# Patient Record
Sex: Male | Born: 1960 | Race: Black or African American | Hispanic: No | Marital: Single | Smoking: Never smoker
Health system: Southern US, Community
[De-identification: ages and names within clinical notes are randomized; demographics above are authoritative.]

## PROBLEM LIST (undated history)

## (undated) DIAGNOSIS — I10 Essential (primary) hypertension: Secondary | ICD-10-CM

## (undated) DIAGNOSIS — C159 Malignant neoplasm of esophagus, unspecified: Secondary | ICD-10-CM

## (undated) DIAGNOSIS — N529 Male erectile dysfunction, unspecified: Secondary | ICD-10-CM

## (undated) DIAGNOSIS — D649 Anemia, unspecified: Secondary | ICD-10-CM

## (undated) DIAGNOSIS — E119 Type 2 diabetes mellitus without complications: Secondary | ICD-10-CM

## (undated) DIAGNOSIS — B019 Varicella without complication: Secondary | ICD-10-CM

## (undated) DIAGNOSIS — T7840XA Allergy, unspecified, initial encounter: Secondary | ICD-10-CM

## (undated) DIAGNOSIS — Z8711 Personal history of peptic ulcer disease: Secondary | ICD-10-CM

## (undated) DIAGNOSIS — J42 Unspecified chronic bronchitis: Secondary | ICD-10-CM

## (undated) DIAGNOSIS — Z8719 Personal history of other diseases of the digestive system: Secondary | ICD-10-CM

## (undated) DIAGNOSIS — I81 Portal vein thrombosis: Secondary | ICD-10-CM

## (undated) DIAGNOSIS — J45909 Unspecified asthma, uncomplicated: Secondary | ICD-10-CM

## (undated) DIAGNOSIS — F1010 Alcohol abuse, uncomplicated: Secondary | ICD-10-CM

## (undated) HISTORY — DX: Varicella without complication: B01.9

## (undated) HISTORY — DX: Type 2 diabetes mellitus without complications: E11.9

## (undated) HISTORY — DX: Essential (primary) hypertension: I10

## (undated) HISTORY — DX: Personal history of other diseases of the digestive system: Z87.19

## (undated) HISTORY — DX: Unspecified asthma, uncomplicated: J45.909

## (undated) HISTORY — DX: Anemia, unspecified: D64.9

## (undated) HISTORY — DX: Personal history of peptic ulcer disease: Z87.11

## (undated) HISTORY — DX: Portal vein thrombosis: I81

## (undated) HISTORY — DX: Malignant neoplasm of esophagus, unspecified: C15.9

## (undated) HISTORY — DX: Allergy, unspecified, initial encounter: T78.40XA

## (undated) HISTORY — PX: HERNIA REPAIR: SHX51

---
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
## 2006-07-24 ENCOUNTER — Emergency Department: Payer: Self-pay | Admitting: Emergency Medicine

## 2007-08-22 ENCOUNTER — Emergency Department: Payer: Self-pay | Admitting: Emergency Medicine

## 2007-11-30 ENCOUNTER — Emergency Department: Payer: Self-pay | Admitting: Internal Medicine

## 2007-12-24 ENCOUNTER — Ambulatory Visit: Payer: Self-pay | Admitting: General Surgery

## 2011-03-17 ENCOUNTER — Emergency Department: Payer: Self-pay | Admitting: Unknown Physician Specialty

## 2011-11-29 ENCOUNTER — Emergency Department: Payer: Self-pay

## 2011-11-30 ENCOUNTER — Emergency Department: Payer: Self-pay | Admitting: Emergency Medicine

## 2014-01-14 ENCOUNTER — Emergency Department: Payer: Self-pay | Admitting: Emergency Medicine

## 2014-01-14 LAB — COMPREHENSIVE METABOLIC PANEL
ALK PHOS: 78 U/L
AST: 22 U/L (ref 15–37)
Albumin: 4.7 g/dL (ref 3.4–5.0)
Anion Gap: 7 (ref 7–16)
BILIRUBIN TOTAL: 1.6 mg/dL — AB (ref 0.2–1.0)
BUN: 14 mg/dL (ref 7–18)
CALCIUM: 10 mg/dL (ref 8.5–10.1)
CO2: 32 mmol/L (ref 21–32)
CREATININE: 0.83 mg/dL (ref 0.60–1.30)
Chloride: 93 mmol/L — ABNORMAL LOW (ref 98–107)
EGFR (Non-African Amer.): >60
GLUCOSE: 128 mg/dL — AB (ref 65–99)
Osmolality: 267 (ref 275–301)
Potassium: 3.8 mmol/L (ref 3.5–5.1)
SGPT (ALT): 32 U/L (ref 12–78)
Sodium: 132 mmol/L — ABNORMAL LOW (ref 136–145)
Total Protein: 9.4 g/dL — ABNORMAL HIGH (ref 6.4–8.2)

## 2014-01-14 LAB — URINALYSIS, COMPLETE
Bacteria: NONE SEEN
Bilirubin,UR: NEGATIVE
Glucose,UR: NEGATIVE mg/dL (ref 0–75)
Hyaline Cast: 1
Leukocyte Esterase: NEGATIVE
Nitrite: NEGATIVE
Ph: 5 (ref 4.5–8.0)
SPECIFIC GRAVITY: 1.027 (ref 1.003–1.030)
Squamous Epithelial: NONE SEEN
WBC UR: 2 /HPF (ref 0–5)

## 2014-01-14 LAB — CBC WITH DIFFERENTIAL/PLATELET
Basophil #: 0 10*3/uL (ref 0.0–0.1)
Basophil %: 0.5 %
Eosinophil #: 0 10*3/uL (ref 0.0–0.7)
Eosinophil %: 0.4 %
HCT: 48.1 % (ref 40.0–52.0)
HGB: 16.1 g/dL (ref 13.0–18.0)
Lymphocyte #: 1 10*3/uL (ref 1.0–3.6)
Lymphocyte %: 14.3 %
MCH: 31.3 pg (ref 26.0–34.0)
MCHC: 33.4 g/dL (ref 32.0–36.0)
MCV: 94 fL (ref 80–100)
MONOS PCT: 14.2 %
Monocyte #: 1 x10 3/mm (ref 0.2–1.0)
Neutrophil #: 5.1 10*3/uL (ref 1.4–6.5)
Neutrophil %: 70.6 %
PLATELETS: 239 10*3/uL (ref 150–440)
RBC: 5.13 10*6/uL (ref 4.40–5.90)
RDW: 13.3 % (ref 11.5–14.5)
WBC: 7.2 10*3/uL (ref 3.8–10.6)

## 2014-01-14 LAB — TROPONIN I: Troponin-I: <0.02 ng/mL

## 2014-01-14 LAB — LIPASE, BLOOD: Lipase: 45 U/L — ABNORMAL LOW (ref 73–393)

## 2014-12-03 ENCOUNTER — Emergency Department: Payer: Self-pay | Admitting: Internal Medicine

## 2014-12-03 LAB — COMPREHENSIVE METABOLIC PANEL
ALBUMIN: 4.3 g/dL (ref 3.4–5.0)
ALK PHOS: 71 U/L
ANION GAP: 10 (ref 7–16)
AST: 51 U/L — AB (ref 15–37)
BUN: 8 mg/dL (ref 7–18)
Bilirubin,Total: 0.9 mg/dL (ref 0.2–1.0)
Calcium, Total: 9.1 mg/dL (ref 8.5–10.1)
Chloride: 97 mmol/L — ABNORMAL LOW (ref 98–107)
Co2: 30 mmol/L (ref 21–32)
Creatinine: 0.69 mg/dL (ref 0.60–1.30)
EGFR (African American): >60
EGFR (Non-African Amer.): >60
Glucose: 94 mg/dL (ref 65–99)
OSMOLALITY: 272 (ref 275–301)
Potassium: 3.8 mmol/L (ref 3.5–5.1)
SGPT (ALT): 42 U/L
Sodium: 137 mmol/L (ref 136–145)
Total Protein: 8.7 g/dL — ABNORMAL HIGH (ref 6.4–8.2)

## 2014-12-03 LAB — CBC
HCT: 45.7 % (ref 40.0–52.0)
HGB: 15.1 g/dL (ref 13.0–18.0)
MCH: 31.6 pg (ref 26.0–34.0)
MCHC: 33.1 g/dL (ref 32.0–36.0)
MCV: 96 fL (ref 80–100)
Platelet: 262 10*3/uL (ref 150–440)
RBC: 4.78 10*6/uL (ref 4.40–5.90)
RDW: 13.3 % (ref 11.5–14.5)
WBC: 6.5 10*3/uL (ref 3.8–10.6)

## 2014-12-03 LAB — TROPONIN I: Troponin-I: <0.02 ng/mL

## 2014-12-03 LAB — URINALYSIS, COMPLETE
BACTERIA: NONE SEEN
BILIRUBIN, UR: NEGATIVE
Blood: NEGATIVE
Glucose,UR: NEGATIVE mg/dL (ref 0–75)
Hyaline Cast: 2
KETONE: NEGATIVE
Leukocyte Esterase: NEGATIVE
Nitrite: NEGATIVE
PH: 5 (ref 4.5–8.0)
Protein: NEGATIVE
RBC,UR: <1 /HPF (ref 0–5)
Specific Gravity: 1.005 (ref 1.003–1.030)
Squamous Epithelial: NONE SEEN

## 2014-12-03 LAB — LIPASE, BLOOD: LIPASE: 45 U/L — AB (ref 73–393)

## 2014-12-03 LAB — PRO B NATRIURETIC PEPTIDE: B-Type Natriuretic Peptide: 47 pg/mL (ref 0–125)

## 2015-08-02 ENCOUNTER — Other Ambulatory Visit: Payer: Self-pay | Admitting: Internal Medicine

## 2015-08-02 ENCOUNTER — Ambulatory Visit (INDEPENDENT_AMBULATORY_CARE_PROVIDER_SITE_OTHER): Payer: PRIVATE HEALTH INSURANCE | Admitting: Internal Medicine

## 2015-08-02 ENCOUNTER — Encounter: Payer: Self-pay | Admitting: Internal Medicine

## 2015-08-02 VITALS — BP 134/72 | HR 82 | Temp 98.2°F | Ht 72.5 in | Wt 166.0 lb

## 2015-08-02 DIAGNOSIS — F1010 Alcohol abuse, uncomplicated: Secondary | ICD-10-CM

## 2015-08-02 DIAGNOSIS — N529 Male erectile dysfunction, unspecified: Secondary | ICD-10-CM

## 2015-08-02 DIAGNOSIS — I10 Essential (primary) hypertension: Secondary | ICD-10-CM | POA: Diagnosis not present

## 2015-08-02 LAB — COMPREHENSIVE METABOLIC PANEL
ALT: 43 U/L (ref 0–53)
AST: 39 U/L — AB (ref 0–37)
Albumin: 4.8 g/dL (ref 3.5–5.2)
Alkaline Phosphatase: 68 U/L (ref 39–117)
BILIRUBIN TOTAL: 1 mg/dL (ref 0.2–1.2)
BUN: 12 mg/dL (ref 6–23)
CALCIUM: 9.8 mg/dL (ref 8.4–10.5)
CHLORIDE: 99 meq/L (ref 96–112)
CO2: 29 meq/L (ref 19–32)
Creatinine, Ser: 0.81 mg/dL (ref 0.40–1.50)
GFR: 126.78 mL/min (ref >60.00)
GLUCOSE: 99 mg/dL (ref 70–99)
POTASSIUM: 4.2 meq/L (ref 3.5–5.1)
Sodium: 138 mEq/L (ref 135–145)
Total Protein: 8.3 g/dL (ref 6.0–8.3)

## 2015-08-02 LAB — CBC
HEMATOCRIT: 44.9 % (ref 39.0–52.0)
Hemoglobin: 15 g/dL (ref 13.0–17.0)
MCHC: 33.5 g/dL (ref 30.0–36.0)
MCV: 94.1 fl (ref 78.0–100.0)
PLATELETS: 264 10*3/uL (ref 150.0–400.0)
RBC: 4.77 Mil/uL (ref 4.22–5.81)
RDW: 14.1 % (ref 11.5–15.5)
WBC: 8.3 10*3/uL (ref 4.0–10.5)

## 2015-08-02 MED ORDER — SILDENAFIL CITRATE 25 MG PO TABS: 25.0000 mg | ORAL_TABLET | Freq: Every day | ORAL | Status: DC | PRN

## 2015-08-02 MED ORDER — DISULFIRAM 500 MG PO TABS: 1.0000 | ORAL_TABLET | Freq: Every day | ORAL | Status: DC

## 2015-08-02 MED ORDER — DISULFIRAM 250 MG PO TABS: 250.0000 mg | ORAL_TABLET | Freq: Every day | ORAL | Status: DC

## 2015-08-02 NOTE — Assessment & Plan Note (Signed)
Pt is aware that there is a problem Discussed long term effects from alcohol abuse Will check CMET today He is interested in seeking out AA meetings, his wife agrees that she will go with him If labs normal, consider antabuse daily as a deterrent

## 2015-08-02 NOTE — Assessment & Plan Note (Signed)
He is interested in treatment for this eRx for Viagra 25 mg as directed Discount Coupon given

## 2015-08-02 NOTE — Progress Notes (Signed)
Pre visit review using our clinic review tool, if applicable. No additional management support is needed unless otherwise documented below in the visit note. 

## 2015-08-02 NOTE — Progress Notes (Signed)
HPI  Pt presents to the clinic today to establish care and for management of the conditions listed below. He has not had a PCP in many years.  Flu: never Tetanus: > 10 years ago PSA Screen: never Colon Screen: never Vision Screening: as needed Dentist: as needed  HTN: He reports he went to [facility]  12/2014. His blood pressure was elevated at that time so he was started on HCTZ. He reports he does not take it because he thinks the medication is interfering with his ability to obtain an erection. He denies headaches, blurred vision, chest pain or shortness of breath.  Erectile Dysfunction: He has difficulty obtaining and maintaining an erection. He is able to achieve orgasm when he gets to that point. He has never had a issue with this in the past which is why he thinks it may be related to the BP medication. However he reports it has not improved since he stopped taking the medication. He is sexually active in a monogamous relationship.  His wife is also with him today. She is concerned about his drinking. She reports he drinks 3- 40 ounce beers in the morning before work and 3- 40 ounce beers every evening. C: He does feel like he needs to cut back A: He does feel like people annoy him about how much he drinks G: He does feel guilty about how much he drinks E: He does feel like he needs to drink in the morning to wake up and function   Past Medical History  Diagnosis Date  . Hypertension   . History of stomach ulcers   . Chicken pox     Current Outpatient Prescriptions  Medication Sig Dispense Refill  . hydrochlorothiazide (HYDRODIURIL) 25 MG tablet Take 25 mg by mouth daily.     No current facility-administered medications for this visit.    No Known Allergies  Family History  Problem Relation Age of Onset  . Hypertension Mother     History   Social History  . Marital Status: Single    Spouse Name: N/A  . Number of Children: N/A  . Years of Education: N/A    Occupational History  . Not on file.   Social History Main Topics  . Smoking status: Never Smoker   . Smokeless tobacco: Current User    Types: Chew     Comment: 5 years  . Alcohol Use: 3.6 oz/week    6 Cans of beer per week     Comment: daily  . Drug Use: No  . Sexual Activity: Not on file   Other Topics Concern  . Not on file   Social History Narrative  . No narrative on file    ROS:  Constitutional: Denies fever, malaise, fatigue, headache or abrupt weight changes.  HEENT: Denies eye pain, eye redness, ear pain, ringing in the ears, wax buildup, runny nose, nasal congestion, bloody nose, or sore throat. Respiratory: Denies difficulty breathing, shortness of breath, cough or sputum production.   Cardiovascular: Denies chest pain, chest tightness, palpitations or swelling in the hands or feet.  Gastrointestinal: Denies abdominal pain, bloating, constipation, diarrhea or blood in the stool.  GU: Pt reports erectile dysfunction. Denies frequency, urgency, pain with urination, blood in urine, odor or discharge. Musculoskeletal: Denies decrease in range of motion, difficulty with gait, muscle pain or joint pain and swelling.  Skin: Denies redness, rashes, lesions or ulcercations.  Neurological: Denies dizziness, difficulty with memory, difficulty with speech or problems with balance and coordination.  Psych: Denies anxiety, depression, SI/HI.  No other specific complaints in a complete review of systems (except as listed in HPI above).  PE:  BP 134/72 mmHg  Pulse 82  Temp(Src) 98.2 F (36.8 C) (Oral)  Ht 6' 0.5" (1.842 m)  Wt 166 lb (75.297 kg)  BMI 22.19 kg/m2  SpO2 98% Wt Readings from Last 3 Encounters:  08/02/15 166 lb (75.297 kg)    General: Appears his stated age, well developed, well nourished in NAD. HEENT: Head: normal shape and size; Eyes: slight scleral icterus bilaterally, conjunctiva pink, PERRLA and EOMs intact;  Cardiovascular: Normal rate and  rhythm. S1,S2 noted.  No murmur, rubs or gallops noted. Pulmonary/Chest: Normal effort and positive vesicular breath sounds. No respiratory distress. No wheezes, rales or ronchi noted.  Abdomen: Distended but soft and nontender. Normal bowel sounds, no bruits noted. No distention or masses noted.  Neurological: Alert and oriented.  Psychiatric: Mood and affect normal. Behavior is normal. Judgment and thought content normal.     BMET    Component Value Date/Time   NA 137 12/03/2014 1205   K 3.8 12/03/2014 1205   CL 97* 12/03/2014 1205   CO2 30 12/03/2014 1205   GLUCOSE 94 12/03/2014 1205   BUN 8 12/03/2014 1205   CREATININE 0.69 12/03/2014 1205   CALCIUM 9.1 12/03/2014 1205   GFRNONAA >60 01/14/2014 2000   GFRAA >60 01/14/2014 2000    Lipid Panel  No results found for: CHOL, TRIG, HDL, CHOLHDL, VLDL, LDLCALC  CBC    Component Value Date/Time   WBC 6.5 12/03/2014 1205   RBC 4.78 12/03/2014 1205   HGB 15.1 12/03/2014 1205   HCT 45.7 12/03/2014 1205   PLT 262 12/03/2014 1205   MCV 96 12/03/2014 1205   MCH 31.6 12/03/2014 1205   MCHC 33.1 12/03/2014 1205   RDW 13.3 12/03/2014 1205   LYMPHSABS 1.0 01/14/2014 2000   MONOABS 1.0 01/14/2014 2000   EOSABS 0.0 01/14/2014 2000   BASOSABS 0.0 01/14/2014 2000    Hgb A1C No results found for: HGBA1C   Assessment and Plan:

## 2015-08-02 NOTE — Patient Instructions (Signed)
Alcohol Use Disorder Alcohol use disorder is a mental disorder. It is not a one-time incident of heavy drinking. Alcohol use disorder is the excessive and uncontrollable use of alcohol over time that leads to problems with functioning in one or more areas of daily living. People with this disorder risk harming themselves and others when they drink to excess. Alcohol use disorder also can cause other mental disorders, such as mood and anxiety disorders, and serious physical problems. People with alcohol use disorder often misuse other drugs.  Alcohol use disorder is common and widespread. Some people with this disorder drink alcohol to cope with or escape from negative life events. Others drink to relieve chronic pain or symptoms of mental illness. People with a family history of alcohol use disorder are at higher risk of losing control and using alcohol to excess.  SYMPTOMS  Signs and symptoms of alcohol use disorder may include the following:   Consumption ofalcohol inlarger amounts or over a longer period of time than intended.  Multiple unsuccessful attempts to cutdown or control alcohol use.   A great deal of time spent obtaining alcohol, using alcohol, or recovering from the effects of alcohol (hangover).  A strong desire or urge to use alcohol (cravings).   Continued use of alcohol despite problems at work, school, or home because of alcohol use.   Continued use of alcohol despite problems in relationships because of alcohol use.  Continued use of alcohol in situations when it is physically hazardous, such as driving a car.  Continued use of alcohol despite awareness of a physical or psychological problem that is likely related to alcohol use. Physical problems related to alcohol use can involve the brain, heart, liver, stomach, and intestines. Psychological problems related to alcohol use include intoxication, depression, anxiety, psychosis, delirium, and dementia.   The need for  increased amounts of alcohol to achieve the same desired effect, or a decreased effect from the consumption of the same amount of alcohol (tolerance).  Withdrawal symptoms upon reducing or stopping alcohol use, or alcohol use to reduce or avoid withdrawal symptoms. Withdrawal symptoms include:  Racing heart.  Hand tremor.  Difficulty sleeping.  Nausea.  Vomiting.  Hallucinations.  Restlessness.  Seizures. DIAGNOSIS Alcohol use disorder is diagnosed through an assessment by your health care provider. Your health care provider may start by asking three or four questions to screen for excessive or problematic alcohol use. To confirm a diagnosis of alcohol use disorder, at least two symptoms must be present within a 12-month period. The severity of alcohol use disorder depends on the number of symptoms:  Mild--two or three.  Moderate--four or five.  Severe--six or more. Your health care provider may perform a physical exam or use results from lab tests to see if you have physical problems resulting from alcohol use. Your health care provider may refer you to a mental health professional for evaluation. TREATMENT  Some people with alcohol use disorder are able to reduce their alcohol use to low-risk levels. Some people with alcohol use disorder need to quit drinking alcohol. When necessary, mental health professionals with specialized training in substance use treatment can help. Your health care provider can help you decide how severe your alcohol use disorder is and what type of treatment you need. The following forms of treatment are available:   Detoxification. Detoxification involves the use of prescription medicines to prevent alcohol withdrawal symptoms in the first week after quitting. This is important for people with a history of symptoms   of withdrawal and for heavy drinkers who are likely to have withdrawal symptoms. Alcohol withdrawal can be dangerous and, in severe cases, cause  death. Detoxification is usually provided in a hospital or in-patient substance use treatment facility.  Counseling or talk therapy. Talk therapy is provided by substance use treatment counselors. It addresses the reasons people use alcohol and ways to keep them from drinking again. The goals of talk therapy are to help people with alcohol use disorder find healthy activities and ways to cope with life stress, to identify and avoid triggers for alcohol use, and to handle cravings, which can cause relapse.  Medicines.Different medicines can help treat alcohol use disorder through the following actions:  Decrease alcohol cravings.  Decrease the positive reward response felt from alcohol use.  Produce an uncomfortable physical reaction when alcohol is used (aversion therapy).  Support groups. Support groups are run by people who have quit drinking. They provide emotional support, advice, and guidance. These forms of treatment are often combined. Some people with alcohol use disorder benefit from intensive combination treatment provided by specialized substance use treatment centers. Both inpatient and outpatient treatment programs are available. Document Released: 01/18/2005 Document Revised: 04/27/2014 Document Reviewed: 03/20/2013 ExitCare Patient Information 2015 [employer]. This information is not intended to replace advice given to you by your health care provider. Make sure you discuss any questions you have with your health care provider.  

## 2015-08-02 NOTE — Assessment & Plan Note (Signed)
I think his BP is well controlled off meds Will check CBC and CMET today He will stop HCTZ at this time

## 2015-08-05 ENCOUNTER — Other Ambulatory Visit: Payer: Self-pay | Admitting: Internal Medicine

## 2015-08-05 ENCOUNTER — Telehealth: Payer: Self-pay

## 2015-08-05 MED ORDER — SILDENAFIL CITRATE 20 MG PO TABS: ORAL_TABLET | ORAL | Status: DC

## 2015-08-05 NOTE — Telephone Encounter (Signed)
Pt called and viagra with discount coupon was going to be $250.00 at [facility]. Pt request generic called to [facility] for $80.00 if [name] NP will consider. Pt request cb. Copy of flyer from [facility] in [place] NP in box.

## 2015-08-05 NOTE — Telephone Encounter (Signed)
done

## 2015-08-10 NOTE — Telephone Encounter (Signed)
Pt called back because did not get cb on 08/05/15. Apologized to pt about not getting return cann and advised pt to ck with [facility]. Pt voiced understanding.

## 2015-09-21 ENCOUNTER — Ambulatory Visit: Payer: Self-pay | Admitting: Internal Medicine

## 2016-02-09 ENCOUNTER — Encounter (HOSPITAL_COMMUNITY): Payer: Self-pay | Admitting: Emergency Medicine

## 2016-02-09 ENCOUNTER — Emergency Department (HOSPITAL_COMMUNITY): Payer: Self-pay

## 2016-02-09 ENCOUNTER — Emergency Department (HOSPITAL_COMMUNITY)
Admission: EM | Admit: 2016-02-09 | Discharge: 2016-02-09 | Disposition: A | Discharge status: Home / Self Care | Payer: Self-pay | Attending: Emergency Medicine | Admitting: Emergency Medicine

## 2016-02-09 DIAGNOSIS — Z8619 Personal history of other infectious and parasitic diseases: Secondary | ICD-10-CM | POA: Insufficient documentation

## 2016-02-09 DIAGNOSIS — R52 Pain, unspecified: Secondary | ICD-10-CM | POA: Insufficient documentation

## 2016-02-09 DIAGNOSIS — R0981 Nasal congestion: Secondary | ICD-10-CM | POA: Insufficient documentation

## 2016-02-09 DIAGNOSIS — M791 Myalgia: Secondary | ICD-10-CM | POA: Insufficient documentation

## 2016-02-09 DIAGNOSIS — Z79899 Other long term (current) drug therapy: Secondary | ICD-10-CM | POA: Insufficient documentation

## 2016-02-09 DIAGNOSIS — I10 Essential (primary) hypertension: Secondary | ICD-10-CM | POA: Insufficient documentation

## 2016-02-09 DIAGNOSIS — Z8719 Personal history of other diseases of the digestive system: Secondary | ICD-10-CM | POA: Insufficient documentation

## 2016-02-09 DIAGNOSIS — J029 Acute pharyngitis, unspecified: Secondary | ICD-10-CM | POA: Insufficient documentation

## 2016-02-09 DIAGNOSIS — R6889 Other general symptoms and signs: Secondary | ICD-10-CM

## 2016-02-09 DIAGNOSIS — R197 Diarrhea, unspecified: Secondary | ICD-10-CM | POA: Insufficient documentation

## 2016-02-09 MED ORDER — DM-GUAIFENESIN ER 30-600 MG PO TB12: 1.0000 | ORAL_TABLET | Freq: Two times a day (BID) | ORAL | Status: DC

## 2016-02-09 MED ORDER — LOPERAMIDE HCL 2 MG PO TABS: 2.0000 mg | ORAL_TABLET | Freq: Four times a day (QID) | ORAL | Status: DC | PRN

## 2016-02-09 NOTE — Discharge Instructions (Signed)
Chest x-ray negative for pneumonia.  Symptoms seem to be consistent with flulike illness. Take Mucinex DM for the cough and congestion. Take the Imodium right ear as needed for the diarrhea. Return for any new or worse symptoms. Work note provided.

## 2016-02-09 NOTE — ED Notes (Addendum)
Pt c/o generalized body aches, non-productive cough, nasal congestion, and diarrhea x 4 days. Denies n/v. Denies fevers.

## 2016-02-09 NOTE — ED Notes (Signed)
Pt attempted to sign signature pad, but it didn't take signature.

## 2016-02-09 NOTE — ED Provider Notes (Signed)
CSN: [id number]     Arrival date & time 02/09/16  5284 History   First MD Initiated Contact with Patient 02/09/16 [phone]     Chief Complaint  Patient presents with  . Generalized Body Aches     (Consider location/radiation/quality/duration/timing/severity/associated sxs/prior Treatment) The history is provided by the patient.   55 year old male with a complaint of body aches cough congestion and diarrhea for 4 days. No nausea no vomiting. No fevers. Patient had the flu shot 2 weeks ago.  Past Medical History  Diagnosis Date  . Hypertension   . History of stomach ulcers   . Chicken pox    Past Surgical History  Procedure Laterality Date  . Appendectomy  1978   Family History  Problem Relation Age of Onset  . Hypertension Mother   . Diabetes Mother   . Cancer Neg Hx   . Heart disease Neg Hx   . Stroke Neg Hx    Social History  Substance Use Topics  . Smoking status: Never Smoker   . Smokeless tobacco: Current User    Types: Chew     Comment: 5 years  . Alcohol Use: Yes     Comment: occ    Review of Systems  Constitutional: Negative for fever.  HENT: Positive for congestion and sore throat.   Eyes: Negative for redness.  Respiratory: Positive for cough. Negative for shortness of breath.   Gastrointestinal: Positive for diarrhea. Negative for nausea, vomiting and abdominal pain.  Genitourinary: Negative for dysuria.  Musculoskeletal: Positive for myalgias.  Skin: Negative for rash.  Neurological: Negative for headaches.  Hematological: Does not bruise/bleed easily.  Psychiatric/Behavioral: Negative for confusion.      Allergies  Review of patient's allergies indicates no known allergies.  Home Medications   Prior to Admission medications   Medication Sig Start Date End Date Taking? Authorizing Provider  dextromethorphan-guaiFENesin (MUCINEX DM) 30-600 MG 12hr tablet Take 1 tablet by mouth 2 (two) times daily. 02/09/16   Fredia Sorrow, MD  disulfiram  (ANTABUSE) 250 MG tablet Take 1 tablet (250 mg total) by mouth daily. 08/02/15   Jearld Fenton, NP  Disulfiram 500 MG TABS Take 1 tablet (500 mg total) by mouth daily. 08/02/15   Jearld Fenton, NP  loperamide (IMODIUM A-D) 2 MG tablet Take 1 tablet (2 mg total) by mouth 4 (four) times daily as needed for diarrhea or loose stools. 02/09/16   Fredia Sorrow, MD  sildenafil (REVATIO) 20 MG tablet Take 2-5 tablets as needed for sexual activity 08/05/15   Jearld Fenton, NP   BP 127/76 mmHg  Pulse 74  Temp(Src) 97.9 F (36.6 C) (Oral)  Resp 20  Ht '6\' 2"'$  (1.88 m)  Wt 74.844 kg  BMI 21.18 kg/m2  SpO2 100% Physical Exam  Constitutional: He is oriented to person, place, and time. He appears well-developed and well-nourished. No distress.  HENT:  Head: Normocephalic and atraumatic.  Mouth/Throat: Oropharynx is clear and moist.  Eyes: Conjunctivae and EOM are normal. Pupils are equal, round, and reactive to light.  Neck: Normal range of motion. Neck supple.  Cardiovascular: Normal rate, regular rhythm and normal heart sounds.   No murmur heard. Pulmonary/Chest: Effort normal and breath sounds normal. No respiratory distress. He has no wheezes. He has no rales.  Abdominal: Soft. Bowel sounds are normal. There is no tenderness.  Musculoskeletal: Normal range of motion.  Neurological: He is alert and oriented to person, place, and time. No cranial nerve deficit. He exhibits normal muscle  tone. Coordination normal.  Skin: Skin is warm. No rash noted.  Nursing note and vitals reviewed.   ED Course  Procedures (including critical care time) Labs Review Labs Reviewed - No data to display  Imaging Review Dg Chest 2 View  02/09/2016  CLINICAL DATA:  Cough and congestion ; fever for 5 days EXAM: CHEST  2 VIEW COMPARISON:  None. FINDINGS: There is no appreciable edema or consolidation. Heart size and pulmonary vascularity are normal. No adenopathy. There are apparent syndesmophytic changes in the  thoracic spine. IMPRESSION: No edema or consolidation. 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 Electronically Signed   By: Lowella Grip III M.D.   On: 02/09/2016 08:04   I have personally reviewed and evaluated these images and lab results as part of my medical decision-making.   EKG Interpretation None      MDM   Final diagnoses:  Flu-like symptoms    4 day history of flulike illness. Cough congestion body aches and not diarrhea. No vomiting. Patient nontoxic no acute distress. Chest x-ray negative for pneumonia. Will treat symptomatically.    Fredia Sorrow, MD 02/09/16 [phone]

## 2016-03-25 ENCOUNTER — Emergency Department: Payer: 59

## 2016-03-25 ENCOUNTER — Emergency Department
Admission: EM | Admit: 2016-03-25 | Discharge: 2016-03-25 | Disposition: A | Discharge status: Home / Self Care | Payer: 59 | Attending: Student | Admitting: Student

## 2016-03-25 ENCOUNTER — Encounter: Payer: Self-pay | Admitting: Emergency Medicine

## 2016-03-25 DIAGNOSIS — J42 Unspecified chronic bronchitis: Secondary | ICD-10-CM | POA: Insufficient documentation

## 2016-03-25 DIAGNOSIS — I10 Essential (primary) hypertension: Secondary | ICD-10-CM | POA: Diagnosis not present

## 2016-03-25 DIAGNOSIS — Z720 Tobacco use: Secondary | ICD-10-CM | POA: Diagnosis not present

## 2016-03-25 DIAGNOSIS — Z79899 Other long term (current) drug therapy: Secondary | ICD-10-CM | POA: Diagnosis not present

## 2016-03-25 DIAGNOSIS — R05 Cough: Secondary | ICD-10-CM | POA: Diagnosis not present

## 2016-03-25 DIAGNOSIS — R0602 Shortness of breath: Secondary | ICD-10-CM | POA: Diagnosis not present

## 2016-03-25 MED ORDER — IPRATROPIUM-ALBUTEROL 0.5-2.5 (3) MG/3ML IN SOLN
3.0000 mL | Freq: Once | RESPIRATORY_TRACT | Status: AC
  Administered 2016-03-25: 3 mL via RESPIRATORY_TRACT
  Filled 2016-03-25: qty 3

## 2016-03-25 MED ORDER — ALBUTEROL SULFATE HFA 108 (90 BASE) MCG/ACT IN AERS: 2.0000 | INHALATION_SPRAY | Freq: Four times a day (QID) | RESPIRATORY_TRACT | Status: DC | PRN

## 2016-03-25 MED ORDER — BENZONATATE 100 MG PO CAPS: 200.0000 mg | ORAL_CAPSULE | Freq: Three times a day (TID) | ORAL | Status: DC | PRN

## 2016-03-25 NOTE — Discharge Instructions (Signed)
Been using inhaler as directed. Tessalon is for cough as needed. You will need to follow up with your doctor at [facility] or go to [facility] for continued evaluation of your chronic cough. More tests may be indicated if not improving. There was no evidence of pneumonia or acute changes on your chest x-ray today.

## 2016-03-25 NOTE — ED Notes (Signed)
Discussed discharge instructions, prescriptions, and follow-up care with patient. No questions or concerns at this time. Pt stable at discharge.  

## 2016-03-25 NOTE — ED Provider Notes (Signed)
[facility] Emergency Department Provider Note  ____________________________________________  Time seen: Approximately 1:34 PM  I have reviewed the triage vital signs and the nursing notes.   HISTORY  Chief Complaint Cough   HPI Joshua Greene is a 55 y.o. male is here with complaint of constant dry cough for over 1 month. Patient denies any respiratory distress and has not been taking any over-the-counter medication. He states he was seen at [facility]  at the beginning of a cough where he was told that he did not have the flu. Patient denies being a smoker but did grow up around people who did smoke.Marland Kitchen He states that during his childhood he did have asthma.He has not used an inhaler in quite some time. He is not aware of any wheezing but states that he feels like he is not able to take deep breaths.   Past Medical History  Diagnosis Date  . Hypertension   . History of stomach ulcers   . Chicken pox     Patient Active Problem List   Diagnosis Date Noted  . Alcohol abuse 08/02/2015  . Erectile dysfunction 08/02/2015  . Essential hypertension 08/02/2015    Past Surgical History  Procedure Laterality Date  . Appendectomy  1978    Current Outpatient Rx  Name  Route  Sig  Dispense  Refill  . albuterol (PROVENTIL HFA;VENTOLIN HFA) 108 (90 Base) MCG/ACT inhaler   Inhalation   Inhale 2 puffs into the lungs every 6 (six) hours as needed for wheezing or shortness of breath.   1 Inhaler   2   . benzonatate (TESSALON PERLES) 100 MG capsule   Oral   Take 2 capsules (200 mg total) by mouth 3 (three) times daily as needed for cough.   30 capsule   0   . dextromethorphan-guaiFENesin (MUCINEX DM) 30-600 MG 12hr tablet   Oral   Take 1 tablet by mouth 2 (two) times daily.   14 tablet   1   . disulfiram (ANTABUSE) 250 MG tablet   Oral   Take 1 tablet (250 mg total) by mouth daily.   30 tablet   0     Take this dose after 2 weeks of the 50 mg  dose   . Disulfiram 500 MG TABS   Oral   Take 1 tablet (500 mg total) by mouth daily.   14 each   0   . loperamide (IMODIUM A-D) 2 MG tablet   Oral   Take 1 tablet (2 mg total) by mouth 4 (four) times daily as needed for diarrhea or loose stools.   30 tablet   0   . sildenafil (REVATIO) 20 MG tablet      Take 2-5 tablets as needed for sexual activity   50 tablet   0     Allergies Review of patient's allergies indicates no known allergies.  Family History  Problem Relation Age of Onset  . Hypertension Mother   . Diabetes Mother   . Cancer Neg Hx   . Heart disease Neg Hx   . Stroke Neg Hx     Social History Social History  Substance Use Topics  . Smoking status: Never Smoker   . Smokeless tobacco: Current User    Types: Chew     Comment: 5 years  . Alcohol Use: Yes     Comment: occ    Review of Systems Constitutional: No known fever/no chills ENT: No sore throat. Cardiovascular: Denies chest pain. Respiratory:  Denies shortness of breath. Positive cough Gastrointestinal:   No nausea, no vomiting.   Skin: Negative for rash. Neurological: Negative for headaches, focal weakness or numbness.  10-point ROS otherwise negative.  ____________________________________________   PHYSICAL EXAM:  VITAL SIGNS: ED Triage Vitals  Enc Vitals Group     BP 03/25/16 1251 121/79 mmHg     Pulse Rate 03/25/16 1251 84     Resp 03/25/16 1251 18     Temp 03/25/16 1251 98.2 F (36.8 C)     Temp Source 03/25/16 1251 Oral     SpO2 03/25/16 1251 95 %     Weight 03/25/16 1251 165 lb (74.844 kg)     Height 03/25/16 1251 '6\' 2"'$  (1.88 m)     Head Cir --      Peak Flow --      Pain Score --      Pain Loc --      Pain Edu? --      Excl. in [place]? --     Constitutional: Alert and oriented. Well appearing and in no acute distress. Eyes: Conjunctivae are normal. PERRL. EOMI. Head: Atraumatic. Nose: No congestion/rhinnorhea.   EACs and TMs are clear bilaterally. Mouth/Throat:  Mucous membranes are moist.  Oropharynx non-erythematous. Neck: No stridor.  Supple Hematological/Lymphatic/Immunilogical: No cervical lymphadenopathy. Cardiovascular: Normal rate, regular rhythm. Grossly normal heart sounds.  Good peripheral circulation. Respiratory: Normal respiratory effort. Poor air exchange at times which is questionable for patient effort. No retractions. Lungs CTAB. Musculoskeletal: Moves upper and lower stories without any difficulty. Normal gait was noted. Neurologic:  Normal speech and language. No gross focal neurologic deficits are appreciated. No gait instability. Skin:  Skin is warm, dry and intact. No rash noted. Psychiatric: Mood and affect are normal. Speech and behavior are normal.  ____________________________________________   LABS (all labs ordered are listed, but only abnormal results are displayed)  Labs Reviewed - No data to display  RADIOLOGY  Chest x-ray per radiologist shows no evidence of  active cardiopulmonary disease. ____________________________________________   PROCEDURES  Procedure(s) performed: None  Critical Care performed: No  ____________________________________________   INITIAL IMPRESSION / ASSESSMENT AND PLAN / ED COURSE  Pertinent labs & imaging results that were available during my care of the patient were reviewed by me and considered in my medical decision making (see chart for details).  Patient is afebrile in the emergency room and does not appear to be any acute distress. The patient was started on Tessalon Perles and Proventil inhaler. After his DuoNeb treatment there was noticeable improvement of air movement and again without wheezing. Patient was encouraged to follow-up with [facility] clinic and we discussed follow-up also with a pulmonologist if his symptoms continue. ____________________________________________   FINAL CLINICAL IMPRESSION(S) / ED DIAGNOSES  Final diagnoses:  Chronic bronchitis,  unspecified chronic bronchitis type ([place])      Johnn Hai, PA-C 03/25/16 1553  Joanne Gavel, MD 03/25/16 1554

## 2016-03-25 NOTE — ED Notes (Signed)
C/o constant dry cough x1 month.  No resp distress

## 2016-03-29 DIAGNOSIS — R05 Cough: Secondary | ICD-10-CM | POA: Diagnosis not present

## 2016-03-29 DIAGNOSIS — F1021 Alcohol dependence, in remission: Secondary | ICD-10-CM | POA: Diagnosis not present

## 2016-03-29 DIAGNOSIS — Z0000 Encounter for general adult medical examination without abnormal findings: Secondary | ICD-10-CM | POA: Diagnosis not present

## 2016-03-29 DIAGNOSIS — F5221 Male erectile disorder: Secondary | ICD-10-CM | POA: Diagnosis not present

## 2016-05-03 ENCOUNTER — Encounter (HOSPITAL_COMMUNITY): Payer: Self-pay | Admitting: Emergency Medicine

## 2016-05-03 ENCOUNTER — Emergency Department (HOSPITAL_COMMUNITY)
Admission: EM | Admit: 2016-05-03 | Discharge: 2016-05-04 | Disposition: A | Discharge status: Home / Self Care | Payer: 59 | Attending: Emergency Medicine | Admitting: Emergency Medicine

## 2016-05-03 ENCOUNTER — Emergency Department (HOSPITAL_COMMUNITY): Payer: 59

## 2016-05-03 DIAGNOSIS — R160 Hepatomegaly, not elsewhere classified: Secondary | ICD-10-CM | POA: Diagnosis not present

## 2016-05-03 DIAGNOSIS — F1020 Alcohol dependence, uncomplicated: Secondary | ICD-10-CM | POA: Diagnosis not present

## 2016-05-03 DIAGNOSIS — R7989 Other specified abnormal findings of blood chemistry: Secondary | ICD-10-CM | POA: Diagnosis not present

## 2016-05-03 DIAGNOSIS — F1010 Alcohol abuse, uncomplicated: Secondary | ICD-10-CM | POA: Insufficient documentation

## 2016-05-03 DIAGNOSIS — R600 Localized edema: Secondary | ICD-10-CM | POA: Insufficient documentation

## 2016-05-03 DIAGNOSIS — R945 Abnormal results of liver function studies: Secondary | ICD-10-CM

## 2016-05-03 DIAGNOSIS — I10 Essential (primary) hypertension: Secondary | ICD-10-CM | POA: Diagnosis not present

## 2016-05-03 DIAGNOSIS — F17220 Nicotine dependence, chewing tobacco, uncomplicated: Secondary | ICD-10-CM | POA: Diagnosis not present

## 2016-05-03 DIAGNOSIS — M7989 Other specified soft tissue disorders: Secondary | ICD-10-CM | POA: Diagnosis present

## 2016-05-03 HISTORY — DX: Alcohol abuse, uncomplicated: F10.10

## 2016-05-03 HISTORY — DX: Male erectile dysfunction, unspecified: N52.9

## 2016-05-03 HISTORY — DX: Unspecified chronic bronchitis: J42

## 2016-05-03 LAB — CBC WITH DIFFERENTIAL/PLATELET
BASOS PCT: 1 %
Basophils Absolute: 0 10*3/uL (ref 0.0–0.1)
EOS ABS: 0.1 10*3/uL (ref 0.0–0.7)
EOS PCT: 1 %
HCT: 44.5 % (ref 39.0–52.0)
HEMOGLOBIN: 14.7 g/dL (ref 13.0–17.0)
Lymphocytes Relative: 28 %
Lymphs Abs: 2.4 10*3/uL (ref 0.7–4.0)
MCH: 30.1 pg (ref 26.0–34.0)
MCHC: 33 g/dL (ref 30.0–36.0)
MCV: 91 fL (ref 78.0–100.0)
MONOS PCT: 18 %
Monocytes Absolute: 1.6 10*3/uL — ABNORMAL HIGH (ref 0.1–1.0)
NEUTROS PCT: 52 %
Neutro Abs: 4.5 10*3/uL (ref 1.7–7.7)
PLATELETS: 211 10*3/uL (ref 150–400)
RBC: 4.89 MIL/uL (ref 4.22–5.81)
RDW: 15.6 % — AB (ref 11.5–15.5)
WBC: 8.6 10*3/uL (ref 4.0–10.5)

## 2016-05-03 LAB — COMPREHENSIVE METABOLIC PANEL
ALK PHOS: 643 U/L — AB (ref 38–126)
ALT: 142 U/L — AB (ref 17–63)
AST: 326 U/L — ABNORMAL HIGH (ref 15–41)
Albumin: 3.3 g/dL — ABNORMAL LOW (ref 3.5–5.0)
Anion gap: 10 (ref 5–15)
BUN: 15 mg/dL (ref 6–20)
CALCIUM: 8.7 mg/dL — AB (ref 8.9–10.3)
CO2: 27 mmol/L (ref 22–32)
CREATININE: 0.62 mg/dL (ref 0.61–1.24)
Chloride: 93 mmol/L — ABNORMAL LOW (ref 101–111)
Glucose, Bld: 92 mg/dL (ref 65–99)
Potassium: 4.4 mmol/L (ref 3.5–5.1)
SODIUM: 130 mmol/L — AB (ref 135–145)
Total Bilirubin: 4.1 mg/dL — ABNORMAL HIGH (ref 0.3–1.2)
Total Protein: 8 g/dL (ref 6.5–8.1)

## 2016-05-03 LAB — CBG MONITORING, ED: GLUCOSE-CAPILLARY: 80 mg/dL (ref 65–99)

## 2016-05-03 LAB — ETHANOL

## 2016-05-03 LAB — BRAIN NATRIURETIC PEPTIDE: B Natriuretic Peptide: 31 pg/mL (ref 0.0–100.0)

## 2016-05-03 MED ORDER — FUROSEMIDE 10 MG/ML IJ SOLN
40.0000 mg | Freq: Once | INTRAMUSCULAR | Status: AC
  Administered 2016-05-04: 40 mg via INTRAVENOUS
  Filled 2016-05-03: qty 4

## 2016-05-03 NOTE — ED Notes (Signed)
Pt notice increases swelling in ankles bilateral

## 2016-05-03 NOTE — ED Provider Notes (Signed)
By signing my name below, I, Randa Evens, attest that this documentation has been prepared under the direction and in the presence of [employer], DO. Electronically Signed: Randa Evens, ED Scribe. 05/03/2016. 11:29 PM.  TIME SEEN: 11:30 PM    CHIEF COMPLAINT: Leg swelling   HPI: HPI Comments: Joshua Greene is a 55 y.o. male who presents to the Emergency Department complaining of bilateral  ankle swelling onset 1 week.  Pt states that he does a lot of standing while at work. Pt denies taking any diuretics. Denies any injuries to his legs. No chest pain or shortness of breath. Pt doesn't report any treatments tried PTA. Denies hx of CHF or liver failure. Denies Hx of DVT or PE. Pt reports occasional alcohol use.    ROS: See HPI Constitutional: no fever  Eyes: no drainage  ENT: no runny nose   Cardiovascular:  no chest pain  Resp: no SOB  GI: no vomiting GU: no dysuria Integumentary: no rash  Allergy: no hives  Musculoskeletal:  leg swelling  Neurological: no slurred speech ROS otherwise negative  PAST MEDICAL HISTORY/PAST SURGICAL HISTORY:  Past Medical History  Diagnosis Date  . Hypertension   . History of stomach ulcers   . Chicken pox   . Alcohol abuse   . Chronic bronchitis ([place])   . Erectile dysfunction     MEDICATIONS:  Prior to Admission medications   Medication Sig Start Date End Date Taking? Authorizing Provider  albuterol (PROVENTIL HFA;VENTOLIN HFA) 108 (90 Base) MCG/ACT inhaler Inhale 2 puffs into the lungs every 6 (six) hours as needed for wheezing or shortness of breath. 03/25/16   Johnn Hai, PA-C  benzonatate (TESSALON PERLES) 100 MG capsule Take 2 capsules (200 mg total) by mouth 3 (three) times daily as needed for cough. 03/25/16 03/25/17  Johnn Hai, PA-C  dextromethorphan-guaiFENesin (MUCINEX DM) 30-600 MG 12hr tablet Take 1 tablet by mouth 2 (two) times daily. 02/09/16   Fredia Sorrow, MD  disulfiram (ANTABUSE) 250 MG tablet Take 1  tablet (250 mg total) by mouth daily. 08/02/15   Jearld Fenton, NP  Disulfiram 500 MG TABS Take 1 tablet (500 mg total) by mouth daily. 08/02/15   Jearld Fenton, NP  loperamide (IMODIUM A-D) 2 MG tablet Take 1 tablet (2 mg total) by mouth 4 (four) times daily as needed for diarrhea or loose stools. 02/09/16   Fredia Sorrow, MD  sildenafil (REVATIO) 20 MG tablet Take 2-5 tablets as needed for sexual activity 08/05/15   Jearld Fenton, NP    ALLERGIES:  No Known Allergies  SOCIAL HISTORY:  Social History  Substance Use Topics  . Smoking status: Never Smoker   . Smokeless tobacco: Current User    Types: Chew     Comment: 5 years  . Alcohol Use: Yes     Comment: occ    FAMILY HISTORY: Family History  Problem Relation Age of Onset  . Hypertension Mother   . Diabetes Mother   . Cancer Neg Hx   . Heart disease Neg Hx   . Stroke Neg Hx     EXAM: BP 127/80 mmHg  Pulse 90  Temp(Src) 98.2 F (36.8 C) (Oral)  Resp 20  Ht '6\' 2"'$  (1.88 m)  Wt 170 lb 4.8 oz (77.248 kg)  BMI 21.86 kg/m2  SpO2 96%   CONSTITUTIONAL: Alert and oriented and responds appropriately to questions. Well-appearing; well-nourished, Afebrile, no distress HEAD: Normocephalic EYES: Conjunctivae clear, PERRL ENT: normal nose; no  rhinorrhea; moist mucous membranes NECK: Supple, no meningismus, no LAD, no JVD CARD: RRR; S1 and S2 appreciated; no murmurs, no clicks, no rubs, no gallops RESP: Normal chest excursion without splinting or tachypnea; breath sounds clear and equal bilaterally; no wheezes, no rhonchi, no rales, no hypoxia or respiratory distress, speaking full sentences ABD/GI: Normal bowel sounds; non-distended; soft, non-tender, no rebound, no guarding, no peritoneal signs hepatomegaly without splenomegaly no fluid wave; no tympany BACK:  The back appears normal and is non-tender to palpation, there is no CVA tenderness EXT: Normal ROM in all joints; non-tender to palpation; bilateral non pitting edema to  bilateral ankles without erythema or warmth; 2+ DP pulses bilaterally ; normal capillary refill; no cyanosis, no calf tenderness or swelling    SKIN: Normal color for age and race; warm; no rash NEURO: Moves all extremities equally, sensation to light touch intact diffusely, cranial nerves II through XII intact PSYCH: The patient's mood and manner are appropriate. Grooming and personal hygiene are appropriate.  MEDICAL DECISION MAKING: Patient here with bilateral pedal edema. It is nonpitting. tenderness or swelling. Equal swelling in bilateral lower extremities. Neurovascularly intact distally. On my examination, patient does have hepatomegaly but no fluid wave to suggest ascites. States he has been told that his liver function tests were elevated and reports he is being followed by the health department. States he just recently moved here to [place]. 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 I think this could be contributing to his symptoms. No chest pain or shortness of breath. Lungs are clear. No JVD. No other sign of significant volume overload.  Per nursing staff, patient did admit to them that he drinks alcohol every day. He also appears per his records that he has been prescribed Antabuse before. I think that this is contributing to his hepatomegaly which could be contributing to his lower extremity swelling.  ED PROGRESS: Patient's labs show elevation of his AST, ALT, alkaline phosphatase and total bilirubin. This is increased since August 2016. No abdominal pain on exam or by history. He denies using Tylenol. We'll obtain a hepatitis panel. Total albumin is slightly lower than normal at 3.3. Troponin negative, BNP 31. Chest x-ray is clear.  EKG shows improvement in T-wave changes in lateral leads compared to prior. I feel patient needs close outpatient workup for his hepatomegaly, elevated liver function tests but again suspect that this is  chronic. He does state that he has been told that he has elevated LFTs in the past at the health department. We'll give him gastroenterology follow-up information. I think that this is related to his alcohol abuse. Have advised him to avoid alcohol and Tylenol. We have given him a dose of Lasix in the emergency department to help with diuresis. Have recommended that he keep his legs elevated when at rest and wear compression stockings when he is having to stand for long periods of time. Again there is no sign otherwise of volume overload. No history of heart failure. I feel he is safe to be discharged home. Patient and his wife are comfortable with this plan.   At this time, I do not feel there is any life-threatening condition present. I have reviewed and discussed all results (EKG, imaging, lab, urine as appropriate), exam findings with patient. I have reviewed nursing notes and appropriate previous records.  I feel the patient is safe to be discharged home without further emergent workup.  Discussed usual and customary return precautions. Patient and family (if present) verbalize understanding and are comfortable with this plan.  Patient will follow-up with their primary care provider. If they do not have a primary care provider, information for follow-up has been provided to them. All questions have been answered.     EKG Interpretation  Date/Time:  Thursday May 04 2016 01:24:39 EDT Ventricular Rate:  86 PR Interval:  142 QRS Duration: 83 QT Interval:  459 QTC Calculation: 549 R Axis:   37 Text Interpretation:  Sinus rhythm Consider left atrial enlargement Borderline T abnormalities, anterior leads Prolonged QT interval Lateral T wave changes on previous EKG have improved Confirmed by WARD,  DO, KRISTEN ([id number]) on 05/04/2016 1:28:00 AM       I personally performed the services described in this documentation, which was scribed in my presence. The recorded information has been reviewed and is  accurate.     [place], DO 05/04/16 [phone]

## 2016-05-04 DIAGNOSIS — F1010 Alcohol abuse, uncomplicated: Secondary | ICD-10-CM | POA: Diagnosis not present

## 2016-05-04 DIAGNOSIS — R7989 Other specified abnormal findings of blood chemistry: Secondary | ICD-10-CM | POA: Diagnosis not present

## 2016-05-04 DIAGNOSIS — R160 Hepatomegaly, not elsewhere classified: Secondary | ICD-10-CM | POA: Diagnosis not present

## 2016-05-04 DIAGNOSIS — R05 Cough: Secondary | ICD-10-CM | POA: Diagnosis not present

## 2016-05-04 DIAGNOSIS — R600 Localized edema: Secondary | ICD-10-CM | POA: Diagnosis not present

## 2016-05-04 DIAGNOSIS — F17220 Nicotine dependence, chewing tobacco, uncomplicated: Secondary | ICD-10-CM | POA: Diagnosis not present

## 2016-05-04 DIAGNOSIS — I10 Essential (primary) hypertension: Secondary | ICD-10-CM | POA: Diagnosis not present

## 2016-05-04 LAB — TROPONIN I: Troponin I: <0.03 ng/mL

## 2016-05-04 LAB — PROTIME-INR
INR: 1.09 (ref 0.00–1.49)
Prothrombin Time: 14.3 s (ref 11.6–15.2)

## 2016-05-04 LAB — APTT: aPTT: 31 s (ref 24–37)

## 2016-05-04 MED ORDER — NAPROXEN 250 MG PO TABS
500.0000 mg | ORAL_TABLET | Freq: Once | ORAL | Status: AC
  Administered 2016-05-04: 500 mg via ORAL
  Filled 2016-05-04: qty 2

## 2016-05-04 NOTE — ED Notes (Signed)
Patient given discharge instruction, verbalized understand. IV removed, band aid applied. Patient ambulatory out of the department.  

## 2016-05-04 NOTE — ED Notes (Signed)
Pt has been up x2 to bathroom to void

## 2016-05-04 NOTE — Discharge Instructions (Signed)
I recommend close follow up with a gastroenterologist for imaging of your liver and to follow your liver enzymes that were elevated today.  Please do not drink alcohol or use Tylenol (acetaminophen) as this will harm your liver further.  Please keep your legs elevated at all times at rest and I recommend using compression hose or socks to help with leg swelling if you are standing for long periods of time.   Alcohol Use Disorder Alcohol use disorder is a mental disorder. It is not a one-time incident of heavy drinking. Alcohol use disorder is the excessive and uncontrollable use of alcohol over time that leads to problems with functioning in one or more areas of daily living. People with this disorder risk harming themselves and others when they drink to excess. Alcohol use disorder also can cause other mental disorders, such as mood and anxiety disorders, and serious physical problems. People with alcohol use disorder often misuse other drugs.  Alcohol use disorder is common and widespread. Some people with this disorder drink alcohol to cope with or escape from negative life events. Others drink to relieve chronic pain or symptoms of mental illness. People with a family history of alcohol use disorder are at higher risk of losing control and using alcohol to excess.  Drinking too much alcohol can cause injury, accidents, and health problems. One drink can be too much when you are:  Working.  Pregnant or breastfeeding.  Taking medicines. Ask your doctor.  Driving or planning to drive. SYMPTOMS  Signs and symptoms of alcohol use disorder may include the following:   Consumption ofalcohol inlarger amounts or over a longer period of time than intended.  Multiple unsuccessful attempts to cutdown or control alcohol use.   A great deal of time spent obtaining alcohol, using alcohol, or recovering from the effects of alcohol (hangover).  A strong desire or urge to use alcohol (cravings).    Continued use of alcohol despite problems at work, school, or home because of alcohol use.   Continued use of alcohol despite problems in relationships because of alcohol use.  Continued use of alcohol in situations when it is physically hazardous, such as driving a car.  Continued use of alcohol despite awareness of a physical or psychological problem that is likely related to alcohol use. Physical problems related to alcohol use can involve the brain, heart, liver, stomach, and intestines. Psychological problems related to alcohol use include intoxication, depression, anxiety, psychosis, delirium, and dementia.   The need for increased amounts of alcohol to achieve the same desired effect, or a decreased effect from the consumption of the same amount of alcohol (tolerance).  Withdrawal symptoms upon reducing or stopping alcohol use, or alcohol use to reduce or avoid withdrawal symptoms. Withdrawal symptoms include:  Racing heart.  Hand tremor.  Difficulty sleeping.  Nausea.  Vomiting.  Hallucinations.  Restlessness.  Seizures. DIAGNOSIS Alcohol use disorder is diagnosed through an assessment by your health care provider. Your health care provider may start by asking three or four questions to screen for excessive or problematic alcohol use. To confirm a diagnosis of alcohol use disorder, at least two symptoms must be present within a 35-monthperiod. The severity of alcohol use disorder depends on the number of symptoms:  Mild--two or three.  Moderate--four or five.  Severe--six or more. Your health care provider may perform a physical exam or use results from lab tests to see if you have physical problems resulting from alcohol use. Your health care provider  may refer you to a mental health professional for evaluation. TREATMENT  Some people with alcohol use disorder are able to reduce their alcohol use to low-risk levels. Some people with alcohol use disorder need to  quit drinking alcohol. When necessary, mental health professionals with specialized training in substance use treatment can help. Your health care provider can help you decide how severe your alcohol use disorder is and what type of treatment you need. The following forms of treatment are available:   Detoxification. Detoxification involves the use of prescription medicines to prevent alcohol withdrawal symptoms in the first week after quitting. This is important for people with a history of symptoms of withdrawal and for heavy drinkers who are likely to have withdrawal symptoms. Alcohol withdrawal can be dangerous and, in severe cases, cause death. Detoxification is usually provided in a hospital or in-patient substance use treatment facility.  Counseling or talk therapy. Talk therapy is provided by substance use treatment counselors. It addresses the reasons people use alcohol and ways to keep them from drinking again. The goals of talk therapy are to help people with alcohol use disorder find healthy activities and ways to cope with life stress, to identify and avoid triggers for alcohol use, and to handle cravings, which can cause relapse.  Medicines.Different medicines can help treat alcohol use disorder through the following actions:  Decrease alcohol cravings.  Decrease the positive reward response felt from alcohol use.  Produce an uncomfortable physical reaction when alcohol is used (aversion therapy).  Support groups. Support groups are run by people who have quit drinking. They provide emotional support, advice, and guidance. These forms of treatment are often combined. Some people with alcohol use disorder benefit from intensive combination treatment provided by specialized substance use treatment centers. Both inpatient and outpatient treatment programs are available.   This information is not intended to replace advice given to you by your health care provider. Make sure you discuss  any questions you have with your health care provider.   Document Released: 01/18/2005 Document Revised: 01/01/2015 Document Reviewed: 03/20/2013 Elsevier Interactive Patient Education 2016 Else[employer] Inc[employer]  Hepatomegaly Hepatomegaly is when the liver is larger than normal or is enlarged. Some health problems can cause the liver to get bigger. Some people have an enlarged liver, but they do not know it. CAUSES This condition may be caused by:  Cirrhosis. This is long-term (chronic) liver damage that is often caused by drinking too much alcohol. Cirrhosis is also caused by metabolic disease, infection, and some drugs.  Hepatitis. This is an infection of the liver.  Fatty liver disease.  Conditions that cause minerals or trace elements to accumulate in the liver, such as Wilson disease.  Cancer. The disease may start in the liver, or cancer may start somewhere else in the body and spread to the liver.  Heart or blood vessel disease. These can cause hepatomegaly if blood backs up into the liver. The cause may also not be known. RISK FACTORS This condition is more likely to develop in people who:  Drink too much alcohol.  Have diabetes.  Are obese.  Use any tobacco products, including cigarettes, chewing tobacco, and e-cigarettes. SYMPTOMS Symptoms of this condition include:  Abdominal pain on the right side.  Fatigue.  Loss of appetite.  Nausea.  Vomiting.  Yellowing of the skin and the whites of the eyes (jaundice). In some cases, there are no symptoms. DIAGNOSIS This condition may be diagnosed with a medical history and physical exam. Your health  care provider may press on the right side of your abdomen to feel your liver. This is a way to check if the edge of your liver sticks out below your rib cage. You may also have other tests, including:  Blood tests. These tests check whether your liver is working like it should. They also check for infection.  Imaging tests,  such as:  CT scan. This is an X-ray that is guided by a computer.  MRI. This creates pictures by using magnets and a computer.  An ultrasound. This test uses sound waves to make images.  Liver biopsy. A small sample of liver tissue is removed to be examined under a microscope. TREATMENT Treatment of this condition depends on what is causing it. HOME CARE INSTRUCTIONS What you need to do at home depends on what is causing the condition. In general:  Take over-the-counter and prescription medicines only as told by your health care provider.  Do not start taking any new medicine unless your health care provider has approved. These include over-the-counter medicines, supplements, and herbal remedies. Some of these can hurt your liver.  Maintain a healthy weight.  Follow a healthy diet. Eat plenty of fruit, vegetables, and whole grains.  Do not drink alcohol.  Do not use any tobacco products, including cigarettes, chewing tobacco, and e-cigarettes. If you need help quitting, ask your health care provider.  Keep all follow-up visits as told by your health care provider. This is important. SEEK MEDICAL CARE IF:  You have increased pain in your abdomen.  You have persistent vomiting.  You have new symptoms.  Your symptoms get worse. SEEK IMMEDIATE MEDICAL CARE IF:  You have bright red blood in your vomit, or your vomit looks like coffee grounds.  You have chest pain.  You have trouble breathing.   This information is not intended to replace advice given to you by your health care provider. Make sure you discuss any questions you have with your health care provider.   Document Released: 03/04/2012 Document Revised: 04/27/2015 Document Reviewed: 01/20/2015 Elsevier Interactive Patient Education 2016 Elsevier Inc.  Edema Edema is an abnormal buildup of fluids in your bodytissues. Edema is somewhatdependent on gravity to pull the fluid to the lowest place in your body. That  makes the condition more common in the legs and thighs (lower extremities). Painless swelling of the feet and ankles is common and becomes more likely as you get older. It is also common in looser tissues, like around your eyes.  When the affected area is squeezed, the fluid may move out of that spot and leave a dent for a few moments. This dent is called pitting.  CAUSES  There are many possible causes of edema. Eating too much salt and being on your feet or sitting for a long time can cause edema in your legs and ankles. Hot weather may make edema worse. Common medical causes of edema include:  Heart failure.  Liver disease.  Kidney disease.  Weak blood vessels in your legs.  Cancer.  An injury.  Pregnancy.  Some medications.  Obesity. SYMPTOMS  Edema is usually painless.Your skin may look swollen or shiny.  DIAGNOSIS  Your health care provider may be able to diagnose edema by asking about your medical history and doing a physical exam. You may need to have tests such as X-rays, an electrocardiogram, or blood tests to check for medical conditions that may cause edema.  TREATMENT  Edema treatment depends on the cause. If you  have heart, liver, or kidney disease, you need the treatment appropriate for these conditions. General treatment may include:  Elevation of the affected body part above the level of your heart.  Compression of the affected body part. Pressure from elastic bandages or support stockings squeezes the tissues and forces fluid back into the blood vessels. This keeps fluid from entering the tissues.  Restriction of fluid and salt intake.  Use of a water pill (diuretic). These medications are appropriate only for some types of edema. They pull fluid out of your body and make you urinate more often. This gets rid of fluid and reduces swelling, but diuretics can have side effects. Only use diuretics as directed by your health care provider. HOME CARE INSTRUCTIONS    Keep the affected body part above the level of your heart when you are lying down.   Do not sit still or stand for prolonged periods.   Do not put anything directly under your knees when lying down.  Do not wear constricting clothing or garters on your upper legs.   Exercise your legs to work the fluid back into your blood vessels. This may help the swelling go down.   Wear elastic bandages or support stockings to reduce ankle swelling as directed by your health care provider.   Eat a low-salt diet to reduce fluid if your health care provider recommends it.   Only take medicines as directed by your health care provider. SEEK MEDICAL CARE IF:   Your edema is not responding to treatment.  You have heart, liver, or kidney disease and notice symptoms of edema.  You have edema in your legs that does not improve after elevating them.   You have sudden and unexplained weight gain. SEEK IMMEDIATE MEDICAL CARE IF:   You develop shortness of breath or chest pain.   You cannot breathe when you lie down.  You develop pain, redness, or warmth in the swollen areas.   You have heart, liver, or kidney disease and suddenly get edema.  You have a fever and your symptoms suddenly get worse. MAKE SURE YOU:   Understand these instructions.  Will watch your condition.  Will get help right away if you are not doing well or get worse.   This information is not intended to replace advice given to you by your health care provider. Make sure you discuss any questions you have with your health care provider.   Document Released: 12/11/2005 Document Revised: 01/01/2015 Document Reviewed: 10/03/2013 Elsevier Interactive Patient Education [employer].

## 2016-05-05 LAB — HEPATITIS PANEL, ACUTE
HCV Ab: 0.2 s/co ratio (ref 0.0–0.9)
HEP B C IGM: NEGATIVE
Hep A IgM: NEGATIVE
Hepatitis B Surface Ag: NEGATIVE

## 2016-05-09 ENCOUNTER — Other Ambulatory Visit (HOSPITAL_COMMUNITY): Payer: Self-pay | Admitting: Internal Medicine

## 2016-05-09 ENCOUNTER — Ambulatory Visit (HOSPITAL_COMMUNITY)
Admission: RE | Admit: 2016-05-09 | Discharge: 2016-05-09 | Disposition: A | Discharge status: Home / Self Care | Payer: 59 | Source: Ambulatory Visit | Attending: Internal Medicine | Admitting: Internal Medicine

## 2016-05-09 ENCOUNTER — Telehealth: Payer: Self-pay | Admitting: Gastroenterology

## 2016-05-09 DIAGNOSIS — R160 Hepatomegaly, not elsewhere classified: Secondary | ICD-10-CM | POA: Diagnosis not present

## 2016-05-09 DIAGNOSIS — R188 Other ascites: Secondary | ICD-10-CM

## 2016-05-09 DIAGNOSIS — K8020 Calculus of gallbladder without cholecystitis without obstruction: Secondary | ICD-10-CM | POA: Insufficient documentation

## 2016-05-09 DIAGNOSIS — R590 Localized enlarged lymph nodes: Secondary | ICD-10-CM | POA: Insufficient documentation

## 2016-05-09 DIAGNOSIS — R945 Abnormal results of liver function studies: Secondary | ICD-10-CM | POA: Diagnosis not present

## 2016-05-09 DIAGNOSIS — K7689 Other specified diseases of liver: Secondary | ICD-10-CM | POA: Diagnosis not present

## 2016-05-09 DIAGNOSIS — I10 Essential (primary) hypertension: Secondary | ICD-10-CM | POA: Diagnosis not present

## 2016-05-09 DIAGNOSIS — K769 Liver disease, unspecified: Secondary | ICD-10-CM | POA: Diagnosis not present

## 2016-05-09 DIAGNOSIS — R17 Unspecified jaundice: Secondary | ICD-10-CM

## 2016-05-09 DIAGNOSIS — D739 Disease of spleen, unspecified: Secondary | ICD-10-CM | POA: Insufficient documentation

## 2016-05-09 DIAGNOSIS — K7460 Unspecified cirrhosis of liver: Secondary | ICD-10-CM | POA: Diagnosis not present

## 2016-05-09 MED ORDER — IOPAMIDOL (ISOVUE-300) INJECTION 61%
100.0000 mL | Freq: Once | INTRAVENOUS | Status: AC | PRN
  Administered 2016-05-09: 100 mL via INTRAVENOUS

## 2016-05-09 MED ORDER — DIATRIZOATE MEGLUMINE & SODIUM 66-10 % PO SOLN
ORAL | Status: AC
  Filled 2016-05-09: qty 30

## 2016-05-09 NOTE — Telephone Encounter (Signed)
Received call from Pablo Lawrence, NP with [facility] practice. Patient presented today to establish care with a PCP. Per NP, patient is jaundiced, fatigued, significant anasarca. Urine dark. UA in office with moderate bilirubin, specific gravity normal, does not appear to be dehydrated. NP states that patient was seen in the ED recently.   I have reviewed these results. He had an elevated bilirubin of 4.1, Alk Phos 643, AST 326, ALT 142. This is markedly changed from prior evaluations as recent as 9 months ago. HOWEVER, it is unclear what outside labs have been, as he was seen by the [facility] apparently as an outpatient.  INR was normal. Cr 0.62. No abdominal imaging completed in the ED. Patient  presented to the ED on 05/04/16. Acute hepatitis panel negative.   I calculated discriminant function and it is 16. History of ETOH but none in 1 week. New onset anasarca is concerning, and elevated LFTs concerning as well. As noted, we have no recent labs to compare this to.   Discussed with Loma Sousa: from her description, he appears even more decompensated. She is ordering stat labs. I recommended a stat CT. I have arranged an appt with me tomorrow at 3:30 in an urgent slot, but I am concerned that he will need inpatient hospitalization prior to this. I told her to inform patient that if he has any abdominal pain, fever, chills, worsening status, present to the ED. NP will review labs and contact me with results as soon as possible. Decision on presenting to ED will be made after review of repeat labs, and initial imaging.  In interim, CT is in process of being ordered.   Orvil Feil, ANP-BC [facility]

## 2016-05-10 ENCOUNTER — Ambulatory Visit (HOSPITAL_COMMUNITY): Payer: 59

## 2016-05-10 ENCOUNTER — Ambulatory Visit: Payer: 59 | Admitting: Gastroenterology

## 2016-05-10 ENCOUNTER — Other Ambulatory Visit (HOSPITAL_COMMUNITY): Payer: Self-pay | Admitting: Oncology

## 2016-05-10 DIAGNOSIS — K769 Liver disease, unspecified: Secondary | ICD-10-CM

## 2016-05-10 NOTE — Telephone Encounter (Signed)
CT reviewed ordered by PCP. Enlarged liver with innumerable hepatic hypodense lesions most compatible with metastatic disease. Multiple splenic hypodense lesions, incompletely characterized. Diffuse mesenteric edema with multiple enlarged mesenteric and retroperitoneal lymph nodes. Discussed with Loma Sousa. I recommend that she direct this patient straight to Oncology. We would be happy to see him if needed for GI issues in the future or if endoscopic procedures are needed; however, he needs ASAP oncology evaluation for plan going forward.   We will cancel his appt today.   Orvil Feil, ANP-BC [facility]

## 2016-05-15 ENCOUNTER — Other Ambulatory Visit: Payer: Self-pay | Admitting: Radiology

## 2016-05-15 ENCOUNTER — Encounter (HOSPITAL_COMMUNITY): Payer: 59 | Attending: Hematology & Oncology

## 2016-05-15 ENCOUNTER — Ambulatory Visit (HOSPITAL_COMMUNITY)
Admission: RE | Admit: 2016-05-15 | Discharge: 2016-05-15 | Disposition: A | Discharge status: Home / Self Care | Payer: 59 | Source: Ambulatory Visit | Attending: Oncology | Admitting: Oncology

## 2016-05-15 DIAGNOSIS — Z809 Family history of malignant neoplasm, unspecified: Secondary | ICD-10-CM | POA: Insufficient documentation

## 2016-05-15 DIAGNOSIS — I2510 Atherosclerotic heart disease of native coronary artery without angina pectoris: Secondary | ICD-10-CM | POA: Diagnosis not present

## 2016-05-15 DIAGNOSIS — K769 Liver disease, unspecified: Secondary | ICD-10-CM | POA: Insufficient documentation

## 2016-05-15 DIAGNOSIS — G893 Neoplasm related pain (acute) (chronic): Secondary | ICD-10-CM | POA: Insufficient documentation

## 2016-05-15 DIAGNOSIS — Z8249 Family history of ischemic heart disease and other diseases of the circulatory system: Secondary | ICD-10-CM | POA: Diagnosis not present

## 2016-05-15 DIAGNOSIS — R64 Cachexia: Secondary | ICD-10-CM | POA: Insufficient documentation

## 2016-05-15 DIAGNOSIS — N529 Male erectile dysfunction, unspecified: Secondary | ICD-10-CM | POA: Insufficient documentation

## 2016-05-15 DIAGNOSIS — Z720 Tobacco use: Secondary | ICD-10-CM | POA: Diagnosis not present

## 2016-05-15 DIAGNOSIS — Z8719 Personal history of other diseases of the digestive system: Secondary | ICD-10-CM | POA: Insufficient documentation

## 2016-05-15 DIAGNOSIS — Z823 Family history of stroke: Secondary | ICD-10-CM | POA: Diagnosis not present

## 2016-05-15 DIAGNOSIS — Z79899 Other long term (current) drug therapy: Secondary | ICD-10-CM | POA: Insufficient documentation

## 2016-05-15 DIAGNOSIS — R490 Dysphonia: Secondary | ICD-10-CM | POA: Diagnosis not present

## 2016-05-15 DIAGNOSIS — C787 Secondary malignant neoplasm of liver and intrahepatic bile duct: Secondary | ICD-10-CM | POA: Insufficient documentation

## 2016-05-15 DIAGNOSIS — I10 Essential (primary) hypertension: Secondary | ICD-10-CM | POA: Diagnosis not present

## 2016-05-15 DIAGNOSIS — Z9889 Other specified postprocedural states: Secondary | ICD-10-CM | POA: Insufficient documentation

## 2016-05-15 DIAGNOSIS — Z833 Family history of diabetes mellitus: Secondary | ICD-10-CM | POA: Diagnosis not present

## 2016-05-15 DIAGNOSIS — K7689 Other specified diseases of liver: Secondary | ICD-10-CM | POA: Insufficient documentation

## 2016-05-15 DIAGNOSIS — J42 Unspecified chronic bronchitis: Secondary | ICD-10-CM | POA: Insufficient documentation

## 2016-05-15 DIAGNOSIS — R918 Other nonspecific abnormal finding of lung field: Secondary | ICD-10-CM | POA: Diagnosis not present

## 2016-05-15 DIAGNOSIS — R140 Abdominal distension (gaseous): Secondary | ICD-10-CM | POA: Insufficient documentation

## 2016-05-15 DIAGNOSIS — R1010 Upper abdominal pain, unspecified: Secondary | ICD-10-CM | POA: Diagnosis not present

## 2016-05-15 LAB — PROTIME-INR
INR: 1.13 (ref 0.00–1.49)
Prothrombin Time: 14.7 seconds (ref 11.6–15.2)

## 2016-05-15 LAB — HEPATIC FUNCTION PANEL
ALT: 110 U/L — AB (ref 17–63)
AST: 265 U/L — AB (ref 15–41)
Albumin: 2.7 g/dL — ABNORMAL LOW (ref 3.5–5.0)
Alkaline Phosphatase: 544 U/L — ABNORMAL HIGH (ref 38–126)
BILIRUBIN DIRECT: 1.8 mg/dL — AB (ref 0.1–0.5)
BILIRUBIN TOTAL: 3.2 mg/dL — AB (ref 0.3–1.2)
Indirect Bilirubin: 1.4 mg/dL — ABNORMAL HIGH (ref 0.3–0.9)
Total Protein: 7.7 g/dL (ref 6.5–8.1)

## 2016-05-15 LAB — LACTATE DEHYDROGENASE: LDH: 1009 U/L — AB (ref 98–192)

## 2016-05-15 MED ORDER — IOPAMIDOL (ISOVUE-370) INJECTION 76%: 75.0000 mL | Freq: Once | INTRAVENOUS | Status: DC | PRN

## 2016-05-15 MED ORDER — IOPAMIDOL (ISOVUE-300) INJECTION 61%
75.0000 mL | Freq: Once | INTRAVENOUS | Status: AC | PRN
  Administered 2016-05-15: 75 mL via INTRAVENOUS

## 2016-05-16 ENCOUNTER — Ambulatory Visit (HOSPITAL_COMMUNITY)
Admission: RE | Admit: 2016-05-16 | Discharge: 2016-05-16 | Disposition: A | Discharge status: Home / Self Care | Payer: 59 | Source: Ambulatory Visit | Attending: Oncology | Admitting: Oncology

## 2016-05-16 ENCOUNTER — Encounter (HOSPITAL_COMMUNITY): Payer: Self-pay

## 2016-05-16 DIAGNOSIS — I2510 Atherosclerotic heart disease of native coronary artery without angina pectoris: Secondary | ICD-10-CM | POA: Diagnosis not present

## 2016-05-16 DIAGNOSIS — K7689 Other specified diseases of liver: Secondary | ICD-10-CM | POA: Diagnosis not present

## 2016-05-16 DIAGNOSIS — C801 Malignant (primary) neoplasm, unspecified: Secondary | ICD-10-CM | POA: Diagnosis not present

## 2016-05-16 DIAGNOSIS — K769 Liver disease, unspecified: Secondary | ICD-10-CM | POA: Diagnosis not present

## 2016-05-16 DIAGNOSIS — C787 Secondary malignant neoplasm of liver and intrahepatic bile duct: Secondary | ICD-10-CM | POA: Diagnosis not present

## 2016-05-16 DIAGNOSIS — R1010 Upper abdominal pain, unspecified: Secondary | ICD-10-CM | POA: Diagnosis not present

## 2016-05-16 DIAGNOSIS — R918 Other nonspecific abnormal finding of lung field: Secondary | ICD-10-CM | POA: Diagnosis not present

## 2016-05-16 DIAGNOSIS — Z9889 Other specified postprocedural states: Secondary | ICD-10-CM | POA: Diagnosis not present

## 2016-05-16 DIAGNOSIS — I10 Essential (primary) hypertension: Secondary | ICD-10-CM | POA: Diagnosis not present

## 2016-05-16 DIAGNOSIS — Z8719 Personal history of other diseases of the digestive system: Secondary | ICD-10-CM | POA: Diagnosis not present

## 2016-05-16 LAB — CBC WITH DIFFERENTIAL/PLATELET
BASOS ABS: 0.1 10*3/uL (ref 0.0–0.1)
Basophils Relative: 1 %
EOS PCT: 1 %
Eosinophils Absolute: 0.1 10*3/uL (ref 0.0–0.7)
HEMATOCRIT: 41.5 % (ref 39.0–52.0)
HEMOGLOBIN: 13.7 g/dL (ref 13.0–17.0)
LYMPHS ABS: 1.7 10*3/uL (ref 0.7–4.0)
LYMPHS PCT: 22 %
MCH: 29.5 pg (ref 26.0–34.0)
MCHC: 33 g/dL (ref 30.0–36.0)
MCV: 89.4 fL (ref 78.0–100.0)
MONOS PCT: 17 %
Monocytes Absolute: 1.3 10*3/uL — ABNORMAL HIGH (ref 0.1–1.0)
Neutro Abs: 4.5 10*3/uL (ref 1.7–7.7)
Neutrophils Relative %: 59 %
Platelets: 245 10*3/uL (ref 150–400)
RBC: 4.64 MIL/uL (ref 4.22–5.81)
RDW: 16.3 % — AB (ref 11.5–15.5)
WBC: 7.7 10*3/uL (ref 4.0–10.5)

## 2016-05-16 LAB — COMPREHENSIVE METABOLIC PANEL
ALK PHOS: 473 U/L — AB (ref 38–126)
ALT: 112 U/L — AB (ref 17–63)
AST: 266 U/L — AB (ref 15–41)
Albumin: 2.6 g/dL — ABNORMAL LOW (ref 3.5–5.0)
Anion gap: 9 (ref 5–15)
BILIRUBIN TOTAL: 3.4 mg/dL — AB (ref 0.3–1.2)
BUN: 9 mg/dL (ref 6–20)
CALCIUM: 8.6 mg/dL — AB (ref 8.9–10.3)
CO2: 27 mmol/L (ref 22–32)
CREATININE: 0.55 mg/dL — AB (ref 0.61–1.24)
Chloride: 95 mmol/L — ABNORMAL LOW (ref 101–111)
GFR calc Af Amer: >60 mL/min (ref >60)
GLUCOSE: 118 mg/dL — AB (ref 65–99)
Potassium: 4 mmol/L (ref 3.5–5.1)
Sodium: 131 mmol/L — ABNORMAL LOW (ref 135–145)
TOTAL PROTEIN: 7.2 g/dL (ref 6.5–8.1)

## 2016-05-16 LAB — HEPATITIS PANEL, ACUTE
HCV Ab: <0.1 s/co ratio (ref 0.0–0.9)
HEP A IGM: NEGATIVE
HEP B C IGM: NEGATIVE
HEP B S AG: NEGATIVE

## 2016-05-16 LAB — PROTIME-INR
INR: 1.19 (ref 0.00–1.49)
Prothrombin Time: 15.3 seconds — ABNORMAL HIGH (ref 11.6–15.2)

## 2016-05-16 LAB — HEPATITIS B SURFACE ANTIGEN: HEP B S AG: NEGATIVE

## 2016-05-16 MED ORDER — MIDAZOLAM HCL 2 MG/2ML IJ SOLN
INTRAMUSCULAR | Status: AC | PRN
  Administered 2016-05-16: 1 mg via INTRAVENOUS
  Administered 2016-05-16: 0.5 mg via INTRAVENOUS

## 2016-05-16 MED ORDER — FENTANYL CITRATE (PF) 100 MCG/2ML IJ SOLN
INTRAMUSCULAR | Status: AC
  Filled 2016-05-16: qty 2

## 2016-05-16 MED ORDER — OXYCODONE HCL 5 MG PO TABS
5.0000 mg | ORAL_TABLET | Freq: Once | ORAL | Status: DC | PRN
  Filled 2016-05-16: qty 1

## 2016-05-16 MED ORDER — MIDAZOLAM HCL 2 MG/2ML IJ SOLN
INTRAMUSCULAR | Status: AC
  Filled 2016-05-16: qty 4

## 2016-05-16 MED ORDER — SODIUM CHLORIDE 0.9 % IV SOLN
INTRAVENOUS | Status: DC
  Administered 2016-05-16: 12:00:00 via INTRAVENOUS

## 2016-05-16 MED ORDER — FENTANYL CITRATE (PF) 100 MCG/2ML IJ SOLN
INTRAMUSCULAR | Status: AC | PRN
  Administered 2016-05-16: 50 ug via INTRAVENOUS

## 2016-05-16 NOTE — Procedures (Signed)
Interventional Radiology Procedure Note  Procedure:  US guided biopsy of liver lesion.  4 x 18G core. Complications: None Recommendations:  - Ok to shower tomorrow - Do not submerge for 7 days - Routine care - observe 3 hours   Signed,  Dulcy Fanny. Earleen Newport, DO

## 2016-05-16 NOTE — Sedation Documentation (Signed)
Patient denies pain and is resting comfortably.  

## 2016-05-16 NOTE — H&P (Signed)
Chief Complaint: liver lesions  Referring Physician:Dr. Ancil Linsey  Supervising Physician: Corrie Mckusick  Patient Status: Out-pt  HPI: Joshua Greene is an 55 y.o. male who presented to the ED about 2 weeks ago with bilateral ankle edema.  He was referred back to his PCP for his edema and elevated LFTs that were found during this visit.  A CT of the Abdomen was ordered which revealed Enlarged liver with innumerable hepatic hypodense lesions most compatible with metastatic disease. Multiple splenic hypodense lesions, incompletely characterized. Diffuse mesenteric edema as well as multiple enlarged mesenteric and retroperitoneal lymph nodes.  A CT of the chest was then ordered which revealed several small nodules and LNs as well.  He has been having back pain for several months along with some upper abdominal pain and distention.  He was referred to oncology who requested a liver biopsy to determine an etiology.   Past Medical History:  Past Medical History  Diagnosis Date  . Hypertension   . History of stomach ulcers   . Chicken pox   . Alcohol abuse   . Chronic bronchitis ([place])   . Erectile dysfunction     Past Surgical History:  Past Surgical History  Procedure Laterality Date  . Appendectomy  1978  . Hernia repair      Family History:  Family History  Problem Relation Age of Onset  . Hypertension Mother   . Diabetes Mother   . Cancer Neg Hx   . Heart disease Neg Hx   . Stroke Neg Hx     Social History:  reports that he has never smoked. His smokeless tobacco use includes Chew. He reports that he drinks alcohol. He reports that he does not use illicit drugs.  Allergies: No Known Allergies  Medications:   Medication List    ASK your doctor about these medications        albuterol 108 (90 Base) MCG/ACT inhaler  Commonly known as:  PROVENTIL HFA;VENTOLIN HFA  Inhale 2 puffs into the lungs every 6 (six) hours as needed for wheezing or shortness of  breath.     benzonatate 100 MG capsule  Commonly known as:  TESSALON PERLES  Take 2 capsules (200 mg total) by mouth 3 (three) times daily as needed for cough.     dextromethorphan-guaiFENesin 30-600 MG 12hr tablet  Commonly known as:  MUCINEX DM  Take 1 tablet by mouth 2 (two) times daily.     Disulfiram 500 MG Tabs  Take 1 tablet (500 mg total) by mouth daily.     disulfiram 250 MG tablet  Commonly known as:  ANTABUSE  Take 1 tablet (250 mg total) by mouth daily.     furosemide 20 MG tablet  Commonly known as:  LASIX  Take 20 mg by mouth daily.     loperamide 2 MG tablet  Commonly known as:  IMODIUM A-D  Take 1 tablet (2 mg total) by mouth 4 (four) times daily as needed for diarrhea or loose stools.     oxycodone 5 MG capsule  Commonly known as:  OXY-IR  Take 5 mg by mouth every 4 (four) hours as needed for pain.     potassium chloride SA 20 MEQ tablet  Commonly known as:  K-DUR,KLOR-CON  Take 20 mEq by mouth daily.     sildenafil 20 MG tablet  Commonly known as:  REVATIO  Take 2-5 tablets as needed for sexual activity        Please HPI for  pertinent positives, otherwise complete 10 system ROS negative, except some weight loss (unsure how much), back pain, abdominal pain.  Mallampati Score: MD Evaluation Airway: WNL Heart: WNL Abdomen: WNL Chest/ Lungs: WNL ASA  Classification: 3 Mallampati/Airway Score: Two  Physical Exam: BP 133/93 mmHg  Pulse 93  Temp(Src) 97.5 F (36.4 C) (Oral)  Resp 18  Wt 169 lb 12.8 oz (77.021 kg)  SpO2 98% Body mass index is 21.79 kg/(m^2). General: ill-appearing black male who is laying in bed in NAD HEENT: head is normocephalic, atraumatic.  Sclera are noninjected.  PERRL.  Ears and nose without any masses or lesions.  Mouth is pink, but dry with poor dentition Heart: regular, rate, and rhythm.  Normal s1,s2. No obvious murmurs, gallops, or rubs noted.  Palpable radial and pedal pulses bilaterally Lungs: CTAB, no wheezes,  rhonchi, or rales noted.  Respiratory effort nonlabored Abd: soft, distended with possible ascites, hypoactive BS, hepatomegaly palpable. No masses or hernias noted MS: all 4 extremities are symmetrical with no cyanosis, clubbing, however, he has +2 pitting edema of his bilateral lower extremities Psych: A&Ox3 with an appropriate affect.   Labs: Results for orders placed or performed during the hospital encounter of 05/16/16 (from the past 48 hour(s))  CBC with Differential/Platelet     Status: Abnormal (Preliminary result)   Collection Time: 05/16/16 12:20 PM  Result Value Ref Range   WBC 7.7 4.0 - 10.5 K/uL   RBC 4.64 4.22 - 5.81 MIL/uL   Hemoglobin 13.7 13.0 - 17.0 g/dL   HCT 41.5 39.0 - 52.0 %   MCV 89.4 78.0 - 100.0 fL   MCH 29.5 26.0 - 34.0 pg   MCHC 33.0 30.0 - 36.0 g/dL   RDW 16.3 (H) 11.5 - 15.5 %   Platelets 245 150 - 400 K/uL   Neutrophils Relative % PENDING %   Neutro Abs PENDING 1.7 - 7.7 K/uL   Band Neutrophils PENDING %   Lymphocytes Relative PENDING %   Lymphs Abs PENDING 0.7 - 4.0 K/uL   Monocytes Relative PENDING %   Monocytes Absolute PENDING 0.1 - 1.0 K/uL   Eosinophils Relative PENDING %   Eosinophils Absolute PENDING 0.0 - 0.7 K/uL   Basophils Relative PENDING %   Basophils Absolute PENDING 0.0 - 0.1 K/uL   WBC Morphology PENDING    RBC Morphology PENDING    Smear Review PENDING    nRBC PENDING 0 /100 WBC   Metamyelocytes Relative PENDING %   Myelocytes PENDING %   Promyelocytes Absolute PENDING %   Blasts PENDING %  Comprehensive metabolic panel     Status: Abnormal   Collection Time: 05/16/16 12:20 PM  Result Value Ref Range   Sodium 131 (L) 135 - 145 mmol/L   Potassium 4.0 3.5 - 5.1 mmol/L   Chloride 95 (L) 101 - 111 mmol/L   CO2 27 22 - 32 mmol/L   Glucose, Bld 118 (H) 65 - 99 mg/dL   BUN 9 6 - 20 mg/dL   Creatinine, Ser 0.55 (L) 0.61 - 1.24 mg/dL   Calcium 8.6 (L) 8.9 - 10.3 mg/dL   Total Protein 7.2 6.5 - 8.1 g/dL   Albumin 2.6 (L) 3.5 -  5.0 g/dL   AST 266 (H) 15 - 41 U/L   ALT 112 (H) 17 - 63 U/L   Alkaline Phosphatase 473 (H) 38 - 126 U/L   Total Bilirubin 3.4 (H) 0.3 - 1.2 mg/dL   GFR calc non Af Amer >60 >60 mL/min  GFR calc Af Amer >60 >60 mL/min    Comment: (NOTE) The eGFR has been calculated using the CKD EPI equation. This calculation has not been validated in all clinical situations. eGFR's persistently <60 mL/min signify possible Chronic Kidney Disease.    Anion gap 9 5 - 15  Protime-INR     Status: Abnormal   Collection Time: 05/16/16 12:20 PM  Result Value Ref Range   Prothrombin Time 15.3 (H) 11.6 - 15.2 seconds   INR 1.19 0.00 - 1.49    Imaging: Ct Chest W Contrast  05/15/2016  CLINICAL DATA:  Staging, new hepatic lesions, upper abdominal pain x 2-3 months EXAM: CT CHEST WITH CONTRAST TECHNIQUE: Multidetector CT imaging of the chest was performed during intravenous contrast administration. CONTRAST:  14m ISOVUE-300 IOPAMIDOL (ISOVUE-300) INJECTION 61% COMPARISON:  CT abdomen dated 05/09/2016 FINDINGS: Mediastinum/Nodes: Heart is top-normal in size. No pericardial effusion. Coronary atherosclerosis in the LAD. No evidence of thoracic aortic aneurysm. 17 mm short axis right lower paraesophageal node (series 2/ image 96). Mildly prominent soft tissue in the right perihilar region (series 2/image 79), poorly evaluated but possibly reflecting additional perihilar nodes. Otherwise, no suspicious mediastinal lymphadenopathy. Fluid/ debris in the distal esophagus. No definite primary esophageal malignancy is seen, although the distal esophagus is mildly thick-walled although underdistended. Lungs/Pleura: 5 mm nodule in the posterior right upper lobe (series 3/ image 49). Additional 3 mm posterior right upper lobe nodule (series 3/image 53). Mild patchy subpleural opacity in the superior segment right lower lobe (series 3/ image 59). Mild biapical pleural-parenchymal scarring. Mild eventration of the right hemidiaphragm  with associated platelike atelectasis along the right middle lobe. Mild emphysematous changes with subpleural reticulation, upper lobe predominant. No focal consolidation. No pleural effusion or pneumothorax. Upper abdomen: Visualized upper abdomen is notable for multifocal hepatic lesions and heterogeneous perfusion, better evaluated on recent CT, suspicious for metastases. Musculoskeletal: Degenerative changes of the visualized thoracolumbar spine. IMPRESSION: Two posterior right upper lobe nodules measuring up to 5 mm, indeterminate. Pulmonary metastases are not excluded. 17 mm short axis right lower paraesophageal node, worrisome for nodal metastasis. Additional suspected right perihilar nodes. Suspected multifocal hepatic metastases, better evaluated on recent CT. Given the findings noted above, the site of primary malignancy remains indeterminate. The most prominent thoracic finding is the lower paraesophageal node, which may reflect nodal spread of abdominal malignancy. However, if the primary malignancy is in the chest, this would most likely be related to a nonvisualized lower esophageal tumor. Electronically Signed   By: SJulian HyM.D.   On: 05/15/2016 15:28    Assessment/Plan 1. Liver lesions, possible ascites -will plan for [place] liver lesion biopsy today.  He has some abdominal distention, which may be secondary to ascites.  If so, he may require a paracentesis prior to his procedure.  The procedure for biopsy and paracentesis have both been explained to the patient. -labs and vitals have been reviewed -Risks and Benefits discussed with the patient including, but not limited to bleeding, infection, damage to adjacent structures or low yield requiring additional tests. All of the patient's questions were answered, patient is agreeable to proceed. Consent signed and in chart.   Thank you for this interesting consult.  I greatly [facility] look forward to  participating in their care.  A copy of this report was sent to the requesting provider on this date.  Electronically Signed: OHenreitta Cea5/23/2017, 1:14 PM   I spent a total of  30 Minutes  in face to face in clinical consultation, greater than 50% of which was counseling/coordinating care for liver lesions, needs biopsy

## 2016-05-16 NOTE — Discharge Instructions (Signed)
Liver Biopsy, Care After °Refer to this sheet in the next few weeks. These instructions provide you with information on caring for yourself after your procedure. Your health care provider may also give you more specific instructions. Your treatment has been planned according to current medical practices, but problems sometimes occur. Call your health care provider if you have any problems or questions after your procedure. °WHAT TO EXPECT AFTER THE PROCEDURE °After your procedure, it is typical to have the following: °· A small amount of discomfort in the area where the biopsy was done and in the right shoulder or shoulder blade. °· A small amount of bruising around the area where the biopsy was done and on the skin over the liver. °· Sleepiness and fatigue for the rest of the day. °HOME CARE INSTRUCTIONS  °· Rest at home for 1-2 days or as directed by your health care provider. °· Have a friend or family member stay with you for at least 24 hours. °· Because of the medicines used during the procedure, you should not do the following things in the first 24 hours: °¨ Drive. °¨ Use machinery. °¨ Be responsible for the care of other people. °¨ Sign legal documents. °¨ Take a bath or shower. °· There are many different ways to close and cover an incision, including stitches, skin glue, and adhesive strips. Follow your health care provider's instructions on: °¨ Incision care. °¨ Bandage (dressing) changes and removal. °¨ Incision closure removal. °· Do not drink alcohol in the first week. °· Do not lift more than 5 pounds or play contact sports for 2 weeks after this test. °· Take medicines only as directed by your health care provider. Do not take medicine containing aspirin or non-steroidal anti-inflammatory medicines such as ibuprofen for 1 week after this test. °· It is your responsibility to get your test results. °SEEK MEDICAL CARE IF:  °· You have increased bleeding from an incision that results in more than a  small spot of blood. °· You have redness, swelling, or increasing pain in any incisions. °· You notice a discharge or a bad smell coming from any of your incisions. °· You have a fever or chills. °SEEK IMMEDIATE MEDICAL CARE IF:  °· You develop swelling, bloating, or pain in your abdomen. °· You become dizzy or faint. °· You develop a rash. °· You are nauseous or vomit. °· You have difficulty breathing, feel short of breath, or feel faint. °· You develop chest pain. °· You have problems with your speech or vision. °· You have trouble balancing or moving your arms or legs. °  °This information is not intended to replace advice given to you by your health care provider. Make sure you discuss any questions you have with your health care provider. °  °Document Released: 06/30/2005 Document Revised: 01/01/2015 Document Reviewed: 02/06/2014 °Elsevier Interactive Patient Education ©2016 [employer] ° °Moderate Conscious Sedation, Adult, Care After °Refer to this sheet in the next few weeks. These instructions provide you with information on caring for yourself after your procedure. Your health care provider may also give you more specific instructions. Your treatment has been planned according to current medical practices, but problems sometimes occur. Call your health care provider if you have any problems or questions after your procedure. °WHAT TO EXPECT AFTER THE PROCEDURE  °After your procedure: °· You may feel sleepy, clumsy, and have poor balance for several hours. °· Vomiting may occur if you eat too soon after the procedure. °  HOME CARE INSTRUCTIONS °· Do not participate in any activities where you could become injured for at least 24 hours. Do not: °¨ Drive. °¨ Swim. °¨ Ride a bicycle. °¨ Operate heavy machinery. °¨ Cook. °¨ Use power tools. °¨ Climb ladders. °¨ Work from a high place. °· Do not make important decisions or sign legal documents until you are improved. °· If you vomit, drink water, juice, or soup  when you can drink without vomiting. Make sure you have little or no nausea before eating solid foods. °· Only take over-the-counter or prescription medicines for pain, discomfort, or fever as directed by your health care provider. °· Make sure you and your family fully understand everything about the medicines given to you, including what side effects may occur. °· You should not drink alcohol, take sleeping pills, or take medicines that cause drowsiness for at least 24 hours. °· If you smoke, do not smoke without supervision. °· If you are feeling better, you may resume normal activities 24 hours after you were sedated. °· Keep all appointments with your health care provider. °SEEK MEDICAL CARE IF: °· Your skin is pale or bluish in color. °· You continue to feel nauseous or vomit. °· Your pain is getting worse and is not helped by medicine. °· You have bleeding or swelling. °· You are still sleepy or feeling clumsy after 24 hours. °SEEK IMMEDIATE MEDICAL CARE IF: °· You develop a rash. °· You have difficulty breathing. °· You develop any type of allergic problem. °· You have a fever. °MAKE SURE YOU: °· Understand these instructions. °· Will watch your condition. °· Will get help right away if you are not doing well or get worse. °  °This information is not intended to replace advice given to you by your health care provider. Make sure you discuss any questions you have with your health care provider. °  °Document Released: 10/01/2013 Document Revised: 01/01/2015 Document Reviewed: 10/01/2013 °Elsevier Interactive Patient Education ©2016 [employer] ° ° °

## 2016-05-19 ENCOUNTER — Encounter (HOSPITAL_BASED_OUTPATIENT_CLINIC_OR_DEPARTMENT_OTHER): Payer: 59 | Admitting: Hematology & Oncology

## 2016-05-19 ENCOUNTER — Ambulatory Visit (HOSPITAL_COMMUNITY)
Admission: RE | Admit: 2016-05-19 | Discharge: 2016-05-19 | Disposition: A | Discharge status: Home / Self Care | Payer: 59 | Source: Ambulatory Visit | Attending: Oncology | Admitting: Oncology

## 2016-05-19 ENCOUNTER — Telehealth (HOSPITAL_COMMUNITY): Payer: Self-pay | Admitting: *Deleted

## 2016-05-19 ENCOUNTER — Encounter (HOSPITAL_COMMUNITY): Payer: Self-pay

## 2016-05-19 VITALS — BP 131/88 | HR 101 | Temp 98.2°F | Resp 18 | Ht 72.5 in | Wt 167.0 lb

## 2016-05-19 DIAGNOSIS — R64 Cachexia: Secondary | ICD-10-CM

## 2016-05-19 DIAGNOSIS — R188 Other ascites: Secondary | ICD-10-CM | POA: Diagnosis not present

## 2016-05-19 DIAGNOSIS — C801 Malignant (primary) neoplasm, unspecified: Secondary | ICD-10-CM | POA: Diagnosis not present

## 2016-05-19 DIAGNOSIS — K769 Liver disease, unspecified: Secondary | ICD-10-CM

## 2016-05-19 DIAGNOSIS — G893 Neoplasm related pain (acute) (chronic): Secondary | ICD-10-CM

## 2016-05-19 DIAGNOSIS — K7689 Other specified diseases of liver: Secondary | ICD-10-CM | POA: Diagnosis not present

## 2016-05-19 DIAGNOSIS — C787 Secondary malignant neoplasm of liver and intrahepatic bile duct: Secondary | ICD-10-CM

## 2016-05-19 DIAGNOSIS — Z720 Tobacco use: Secondary | ICD-10-CM

## 2016-05-19 DIAGNOSIS — R140 Abdominal distension (gaseous): Secondary | ICD-10-CM | POA: Diagnosis not present

## 2016-05-19 DIAGNOSIS — Z789 Other specified health status: Secondary | ICD-10-CM

## 2016-05-19 DIAGNOSIS — Z7289 Other problems related to lifestyle: Secondary | ICD-10-CM

## 2016-05-19 DIAGNOSIS — R490 Dysphonia: Secondary | ICD-10-CM

## 2016-05-19 MED ORDER — MORPHINE SULFATE 15 MG PO TABS: 15.0000 mg | ORAL_TABLET | ORAL | Status: DC | PRN

## 2016-05-19 NOTE — Telephone Encounter (Signed)
Cancer Type ID order, demographic, insurance card, pathology report sent in via fax (7 pages total). Fax confirmation obtained.

## 2016-05-19 NOTE — Progress Notes (Signed)
 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 Diffuse mesenteric edema as well as multiple enlarged mesenteric and retroperitoneal LN   05/15/2016 Imaging Two posterior RUL nodules, indeterminate, 17 mm short axis RL paraesophageal node worrisome for nodal metastasis. Site of primary remains indeterminate   05/16/2016 Initial Biopsy Ultrasound guided biopsy of R sided liver lesion   05/16/2016 Pathology Results Metastatic poorly differentiated carcinoma, immunoprofile not entirely specific. Favor poorly differentiated squamous cell carcinoma,     HISTORY OF PRESENTING ILLNESS:  Joshua Greene 54 y.o. male is here because of referral by Dr. Zack Hall for metastatic cancer.   Joshua Greene is accompanied by his fiance. I personally reviewed and went over laboratory and pathology results with the patient at length.  Joshua Greene presented to the ED on 05/04/2016 secondary to bilateral leg swelling. Joshua Greene was noted to have abnormal liver functions and hepatomegaly, this was attributed to alcohol use. Joshua Greene was referred to Dr. Zack Hall the following week to establish a PCP. Because of his grossly abnormal exam Joshua Greene was referred to CT of the abdomen on 5/16. CT report is detailed above.   Courtney Keatts NP called our office once CT of the abdomen was available. CT chest was arranged and biopsy of liver lesion prior to his appointment today.  Joshua Greene has not been feeling well off and on for the last several years.  His heaviest weight was 165-170 lbs. Joshua Greene believes Joshua Greene is "skin and bones now." Joshua Greene notes Joshua Greene has no appetite. Food does not taste good. "I eat just a  little bit, I have lost so much weight". Joshua Greene has tried Ensure. Joshua Greene has not tried Boost. Sometimes when Joshua Greene eats Joshua Greene feels like Joshua Greene has to throw up, and other times Joshua Greene does throw up. His fiance believes Joshua Greene has lost about 10-15 lbs.  Joshua Greene had been hurting off and on when getting out of work, with heavy leg swelling. Joshua Greene would not feel good but Joshua Greene continued to work. Joshua Greene has issues talking where Joshua Greene cannot get his voice. Joshua Greene began to get hoarse a couple months ago. Joshua Greene denies a sore throat, "I just can't talk". Joshua Greene chews tobacco and has done so for many years.  Reports Joshua Greene cannot sleep because of abdominal pain.   His fiance reports Joshua Greene is tired and lays down most of the time. She tries to encourage Joshua Greene to at least sit up rather than lying down all day.   Joshua Greene works in the [employer]. Joshua Greene is currently out of work until Joshua Greene figures out what is wrong with Joshua Greene.  The patient is here for further evaluation and discussion of newly diagnosed metastatic carcinoma including additional studies needed to further diagnose his disease and future treatment plans.  MEDICAL HISTORY:  Past Medical History  Diagnosis Date  . Hypertension   . History of stomach ulcers   . Chicken pox   . Alcohol abuse   . Chronic bronchitis (HCC)   . Erectile dysfunction     SURGICAL HISTORY: Past Surgical History  Procedure Laterality Date  . Appendectomy  1978  . Hernia repair      SOCIAL HISTORY: Social History     Social History  . Marital Status: Single    Spouse Name: N/A  . Number of Children: N/A  . Years of Education: N/A   Occupational History  . Not on file.   Social History Main Topics  . Smoking status: Never Smoker   . Smokeless tobacco: Current User    Types: Chew     Comment: 5 years  . Alcohol Use: Yes     Comment: occ  . Drug Use: No  . Sexual Activity: Yes   Other Topics Concern  . Not on file   Social History Narrative   Fiance, engaged for 3 years and known each other their whole  lives 1 son 1 grandchild Non smoker ETOH, a lot of beer. Joshua Greene has quit drinking for over a month.  Chews tobacco. Joshua Greene works in the [employer] and has for 3-4 months  FAMILY HISTORY: Family History  Problem Relation Age of Onset  . Hypertension Mother   . Diabetes Mother   . Cancer Neg Hx   . Heart disease Neg Hx   . Stroke Neg Hx    Mother is still living at 50 yo Father is still living at 29 yo 3 sisters and 1 brother, healthy  ALLERGIES:  has No Known Allergies.  MEDICATIONS:  Current Outpatient Prescriptions  Medication Sig Dispense Refill  . albuterol (PROVENTIL HFA;VENTOLIN HFA) 108 (90 Base) MCG/ACT inhaler Inhale 2 puffs into the lungs every 6 (six) hours as needed for wheezing or shortness of breath. 1 Inhaler 2  . benzonatate (TESSALON PERLES) 100 MG capsule Take 2 capsules (200 mg total) by mouth 3 (three) times daily as needed for cough. 30 capsule 0  . dextromethorphan-guaiFENesin (MUCINEX DM) 30-600 MG 12hr tablet Take 1 tablet by mouth 2 (two) times daily. 14 tablet 1  . disulfiram (ANTABUSE) 250 MG tablet Take 1 tablet (250 mg total) by mouth daily. 30 tablet 0  . Disulfiram 500 MG TABS Take 1 tablet (500 mg total) by mouth daily. 14 each 0  . furosemide (LASIX) 20 MG tablet Take 20 mg by mouth daily.    Marland Kitchen loperamide (IMODIUM A-D) 2 MG tablet Take 1 tablet (2 mg total) by mouth 4 (four) times daily as needed for diarrhea or loose stools. 30 tablet 0  . oxycodone (OXY-IR) 5 MG capsule Take 5 mg by mouth every 4 (four) hours as needed for pain.    . potassium chloride SA (K-DUR,KLOR-CON) 20 MEQ tablet Take 20 mEq by mouth daily.    . sildenafil (REVATIO) 20 MG tablet Take 2-5 tablets as needed for sexual activity 50 tablet 0   No current facility-administered medications for this visit.    Review of Systems  Constitutional: Positive for weight loss (10-15 lbs weight loss) and malaise/fatigue.       Loss of appetite.  HENT: Negative.  Negative for  sore throat.   Eyes: Negative.   Respiratory: Negative.   Cardiovascular: Positive for leg swelling.  Gastrointestinal: Positive for vomiting.       Abdominal swelling in the last couple of weeks.  Genitourinary: Negative.   Musculoskeletal: Negative.   Skin: Negative.   Neurological: Positive for speech change and weakness.       Hoarse voice began a couple months ago.  Endo/Heme/Allergies: Negative.   Psychiatric/Behavioral: The patient has insomnia.        Trouble sleeping secondary to pain.  All other systems reviewed and are negative.  14 point ROS was done and is  otherwise as detailed above or in HPI   PHYSICAL EXAMINATION: ECOG PERFORMANCE STATUS: 2 - Symptomatic, <50% confined to bed  Filed Vitals:   05/19/16 1515  BP: 131/88  Pulse: 101  Temp: 98.2 F (36.8 C)  Resp: 18   Filed Weights   05/19/16 1515  Weight: 167 lb (75.751 kg)     Physical Exam  Constitutional: Joshua Greene is oriented to person, place, and time.  Very frail. Cachectic. Hoarse voice.  HENT:  Head: Normocephalic and atraumatic.  Mouth/Throat: Oropharynx is clear and moist. No oropharyngeal exudate.  Eyes: Conjunctivae and EOM are normal. Pupils are equal, round, and reactive to light. Right eye exhibits no discharge. Left eye exhibits no discharge. Scleral icterus is present.  Neck: Normal range of motion. Neck supple. No tracheal deviation present. No thyromegaly present.  Palpable left submandibular node about 1.5 cm in size  Cardiovascular: Normal rate, regular rhythm and normal heart sounds.   No murmur heard. Pulmonary/Chest: Effort normal and breath sounds normal. No respiratory distress. Joshua Greene has no wheezes. Joshua Greene has no rales. Joshua Greene exhibits no tenderness.  Abdominal: Soft. Bowel sounds are normal. Joshua Greene exhibits distension. There is tenderness.  Significant Hepatomegaly.   Musculoskeletal: Normal range of motion. Joshua Greene exhibits edema.  1 to 2+ pitting edema in bilateral lower extremities.   Lymphadenopathy:    Joshua Greene has cervical adenopathy.  Neurological: Joshua Greene is alert and oriented to person, place, and time. No cranial nerve deficit. Gait normal. Coordination normal.  Skin: Skin is warm and dry. No rash noted. Joshua Greene is not diaphoretic. No erythema.  Psychiatric: Mood, memory, affect and judgment normal.  Nursing note and vitals reviewed.    LABORATORY DATA:  I have reviewed the data as listed Lab Results  Component Value Date   WBC 7.7 05/16/2016   HGB 13.7 05/16/2016   HCT 41.5 05/16/2016   MCV 89.4 05/16/2016   PLT 245 05/16/2016   Results for Cerro, Garcia (MRN [id number]) as of 05/24/2016 07:53  Ref. Range 05/16/2016 12:20  Sodium Latest Ref Range: 135-145 mmol/L 131 (L)  Potassium Latest Ref Range: 3.5-5.1 mmol/L 4.0  Chloride Latest Ref Range: 101-111 mmol/L 95 (L)  CO2 Latest Ref Range: 22-32 mmol/L 27  BUN Latest Ref Range: 6-20 mg/dL 9  Creatinine Latest Ref Range: 0.61-1.24 mg/dL 0.55 (L)  Calcium Latest Ref Range: 8.9-10.3 mg/dL 8.6 (L)  EGFR (Non-African Amer.) Latest Ref Range: >60 mL/min >60  EGFR (African American) Latest Ref Range: >60 mL/min >60  Glucose Latest Ref Range: 65-99 mg/dL 118 (H)  Anion gap Latest Ref Range: 5-15  9  Alkaline Phosphatase Latest Ref Range: 38-126 U/L 473 (H)  Albumin Latest Ref Range: 3.5-5.0 g/dL 2.6 (L)  AST Latest Ref Range: 15-41 U/L 266 (H)  ALT Latest Ref Range: 17-63 U/L 112 (H)  Total Protein Latest Ref Range: 6.5-8.1 g/dL 7.2  Total Bilirubin Latest Ref Range: 0.3-1.2 mg/dL 3.4 (H)  WBC Latest Ref Range: 4.0-10.5 K/uL 7.7  RBC Latest Ref Range: 4.22-5.81 MIL/uL 4.64  Hemoglobin Latest Ref Range: 13.0-17.0 g/dL 13.7  HCT Latest Ref Range: 39.0-52.0 % 41.5  MCV Latest Ref Range: 78.0-100.0 fL 89.4  MCH Latest Ref Range: 26.0-34.0 pg 29.5  MCHC Latest Ref Range: 30.0-36.0 g/dL 33.0  RDW Latest Ref Range: 11.5-15.5 % 16.3 (H)  Platelets Latest Ref Range: 150-400 K/uL 245  Neutrophils Latest Units: % 59   Lymphocytes Latest Units: % 22  Monocytes Relative Latest Units: % 17  Eosinophil Latest Units: % 1  Basophil Latest   Units: % 1  NEUT# Latest Ref Range: 1.7-7.7 K/uL 4.5  Lymphocyte # Latest Ref Range: 0.7-4.0 K/uL 1.7  Monocyte # Latest Ref Range: 0.1-1.0 K/uL 1.3 (H)  Eosinophils Absolute Latest Ref Range: 0.0-0.7 K/uL 0.1  Basophils Absolute Latest Ref Range: 0.0-0.1 K/uL 0.1  Smear Review Unknown LARGE PLATELETS P...  Prothrombin Time Latest Ref Range: 11.6-15.2 seconds 15.3 (H)  INR Latest Ref Range: 0.00-1.49  1.19   RADIOGRAPHIC STUDIES: I have personally reviewed the radiological images as listed and agreed with the findings in the report. US Biopsy  05/16/2016  INDICATION: 55 year old male with imaging diagnosis of liver metastases and no known primary. Joshua Greene has been referred for biopsy. EXAM: ULTRASOUND BIOPSY CORE LIVER MEDICATIONS: None. ANESTHESIA/SEDATION: Moderate (conscious) sedation was employed during this procedure. A total of Versed 1.5 mg and Fentanyl 50 mcg was administered intravenously. Moderate Sedation Time: 11 minutes. The patient's level of consciousness and vital signs were monitored continuously by radiology nursing throughout the procedure under my direct supervision. FLUOROSCOPY TIME:  None COMPLICATIONS: None PROCEDURE: The procedure, risks, benefits, and alternatives were explained to the patient. Questions regarding the procedure were encouraged and answered. The patient understands and consents to the procedure. Ultrasound survey was performed with images stored and sent to PACs. The right upper abdomen was prepped with chlorhexidine in a sterile fashion, and a sterile drape was applied covering the operative field. A sterile gown and sterile gloves were used for the procedure. Local anesthesia was provided with 1% Lidocaine. Once the patient is prepped and draped in usual sterile fashion and the skin and subcutaneous tissues were generously infiltrated with 1%  lidocaine a small stab incision was made with an 11 blade scalpel. Using ultrasound guidance, 18 gauge 10 cm guide needle was advanced into the right liver lobe, targeting a mid right liver lobe hypodense lesion, 1 of many. Several 18 gauge core biopsy were achieved. Three Gel-Foam pledgets were then infused with a small amount of saline. Needle was withdrawn. Final image was stored after biopsy. Patient tolerated the procedure well and remained hemodynamically stable throughout. No complications were encountered and no significant blood loss was encounter IMPRESSION: Status post ultrasound-guided biopsy of right-sided liver lesion with tissue specimen sent to pathology for complete histopathologic analysis. Signed, Dulcy Fanny. Earleen Newport, DO [facility] Electronically Signed   By: Corrie Mckusick D.O.   On: 05/16/2016 17:37   PATHOLOGY   ASSESSMENT & PLAN:  Metastatic carcinoma of unknown primary Liver metastases Cancer cachexia Cancer related Pain Hoarseness Tobacco use Alcohol use  Unknown primary at this time; path suggestive of poorly differentiated squamous cell but per path report, upper GI and pancreaticobiliary adenocarcinoma also a possibility. CT imaging reviewed and CT chest notes lower paraesophageal node "which may reflect nodal spread of abdominal malignancy."  I have sent a note to Dr. Gala Romney to see if Joshua Greene can work the patient in early next week for an EGD.  Unfortunately malignancy is quite advanced with liver dysfunction noted. Patient's PS is 2. I am concerned given his liver dysfunction and PS that Joshua Greene may have limited treatment options.  Have recommended the following: 1. Biotheranostics for Cancer type ID 2. EGD 3. Port Placement 4. Nutrition consult 5. If tissue available, [facility] 6. PET/CT, to help further ID primary site 7. ? Cause of hoarseness (head and neck primary? Although path not suggestive) 8. Will send  Joshua Greene to radiology for ultrasound to see if Joshua Greene has any ascites, unfortunately  I believe his abdominal distension is only secondary to his hepatomegaly 9. Pain control, constipation teaching 10. Follow-up post PET/CT and EGD for further recommendations and discussion   ORDERS PLACED FOR THIS ENCOUNTER: Orders Placed This Encounter  Procedures  . NM PET Image Initial (PI) Skull Base To Thigh  . IR Removal Tun Access W/ Port W/O FL    All questions were answered. The patient knows to call the clinic with any problems, questions or concerns.  This document serves as a record of services personally performed by Shannon Penland, MD. It was created on her behalf by Elizabeth Ashley, a trained medical scribe. The creation of this record is based on the scribe's personal observations and the provider's statements to them. This document has been checked and approved by the attending provider.  I have reviewed the above documentation for accuracy and completeness, and I agree with the above.  This note was electronically signed.    Penland,Shannon Kristen, MD  05/19/2016 3:31 PM    

## 2016-05-19 NOTE — Patient Instructions (Addendum)
[place] at [facility] Discharge Instructions  RECOMMENDATIONS MADE BY THE CONSULTANT AND ANY TEST RESULTS WILL BE SENT TO YOUR REFERRING PHYSICIAN.  Exam and discussion today with Dr. Whitney Muse. Morphine prescription given. PET scan as scheduled. Port placement as scheduled (Interventional Radiology will call you with your appointment date and time for this). Consult with dietician done. Return to clinic as scheduled.   Thank you for choosing [place] at [facility] to provide your oncology and hematology care.  To afford each patient quality time with our provider, please arrive at least 15 minutes before your scheduled appointment time.   Beginning January 23rd 2017 lab work for the [facility] will be done in the  Main lab at [facility] on 1st floor. If you have a lab appointment with the [place] please come in thru the  Main Entrance and check in at the main information desk  You need to re-schedule your appointment should you arrive 10 or more minutes late.  We strive to give you quality time with our providers, and arriving late affects you and other patients whose appointments are after yours.  Also, if you no show three or more times for appointments you may be dismissed from the clinic at the providers discretion.     Again, thank you for choosing [facility].  Our hope is that these requests will decrease the amount of time that you wait before being seen by our physicians.       _____________________________________________________________  Should you have questions after your visit to [facility], please contact our office at [phone] between the hours of 8:30 a.m. and 4:30 p.m.  Voicemails left after 4:30 p.m. will not be returned until the following business day.  For prescription refill requests, have your pharmacy contact our office.         Resources For Cancer Patients and their  Caregivers ? American Cancer Society: Can assist with transportation, wigs, general needs, runs Look Good Feel Better.        570 191 9098 ? Cancer Care: Provides financial assistance, online support groups, medication/co-pay assistance.  1-800-813-HOPE 269-041-7958) ? [place] Assists [place] cancer patients and their families through emotional , educational and financial support.  517-846-5196 ? [place] DSS Where to apply for food stamps, Medicaid and utility assistance. 971-112-3976 ? [facility]: Transportation to medical appointments. (647) 594-1086 ? Social Security Administration: May apply for disability if have a Stage IV cancer. 360-371-4419 205 429 1593 ? [employer], Disability and Transit Services: Assists with nutrition, care and transit needs. [place] Support Programs: '@10RELATIVEDAYS'$ @ > Cancer Support Group  2nd Tuesday of the month 1pm-2pm, Journey Room  > Creative Journey  3rd Tuesday of the month 1130am-1pm, Journey Room  > Look Good Feel Better  1st Wednesday of the month 10am-12 noon, Journey Room (Call [place] to register [phone])

## 2016-05-19 NOTE — Progress Notes (Signed)
Patient did not undergo paracentesis today. No distress upon departure from department

## 2016-05-22 DIAGNOSIS — C159 Malignant neoplasm of esophagus, unspecified: Secondary | ICD-10-CM

## 2016-05-22 DIAGNOSIS — C155 Malignant neoplasm of lower third of esophagus: Secondary | ICD-10-CM | POA: Insufficient documentation

## 2016-05-22 HISTORY — DX: Malignant neoplasm of esophagus, unspecified: C15.9

## 2016-05-23 ENCOUNTER — Other Ambulatory Visit: Payer: Self-pay

## 2016-05-23 ENCOUNTER — Telehealth: Payer: Self-pay

## 2016-05-23 DIAGNOSIS — C799 Secondary malignant neoplasm of unspecified site: Secondary | ICD-10-CM

## 2016-05-23 MED ORDER — SODIUM CHLORIDE 0.9 % IV SOLN: INTRAVENOUS | Status: DC

## 2016-05-23 NOTE — Telephone Encounter (Signed)
-----   Message from Daneil Dolin, MD sent at 05/23/2016  2:15 PM EDT -----  And obviously, no office visit required. It would be nice to have a triage sheet for meds, etc. for me to review. Needs to be done tomorrow or the next day thanks.

## 2016-05-23 NOTE — Telephone Encounter (Signed)
Pt is aware to be at [facility] on 05/24/16 @ 2:00pm. I went over the instructions with him. There has been no change in hid information.

## 2016-05-24 ENCOUNTER — Ambulatory Visit (HOSPITAL_COMMUNITY): Admit: 2016-05-24 | Payer: 59 | Admitting: Internal Medicine

## 2016-05-24 ENCOUNTER — Encounter (HOSPITAL_COMMUNITY): Payer: Self-pay | Admitting: Hematology & Oncology

## 2016-05-24 ENCOUNTER — Ambulatory Visit (HOSPITAL_COMMUNITY)
Admission: RE | Admit: 2016-05-24 | Discharge: 2016-05-24 | Disposition: A | Discharge status: Home / Self Care | Payer: 59 | Source: Ambulatory Visit | Attending: Internal Medicine | Admitting: Internal Medicine

## 2016-05-24 ENCOUNTER — Encounter (HOSPITAL_COMMUNITY): Payer: Self-pay

## 2016-05-24 ENCOUNTER — Other Ambulatory Visit: Payer: Self-pay | Admitting: Radiology

## 2016-05-24 ENCOUNTER — Encounter (HOSPITAL_COMMUNITY): Payer: Self-pay | Admitting: *Deleted

## 2016-05-24 ENCOUNTER — Encounter (HOSPITAL_COMMUNITY)
Admission: RE | Disposition: A | Discharge status: Home / Self Care | Payer: Self-pay | Source: Ambulatory Visit | Attending: Internal Medicine

## 2016-05-24 ENCOUNTER — Other Ambulatory Visit (HOSPITAL_COMMUNITY): Payer: Self-pay | Admitting: *Deleted

## 2016-05-24 DIAGNOSIS — K7689 Other specified diseases of liver: Secondary | ICD-10-CM | POA: Diagnosis not present

## 2016-05-24 DIAGNOSIS — Z5309 Procedure and treatment not carried out because of other contraindication: Secondary | ICD-10-CM | POA: Diagnosis not present

## 2016-05-24 DIAGNOSIS — C787 Secondary malignant neoplasm of liver and intrahepatic bile duct: Secondary | ICD-10-CM | POA: Diagnosis not present

## 2016-05-24 DIAGNOSIS — C799 Secondary malignant neoplasm of unspecified site: Secondary | ICD-10-CM

## 2016-05-24 DIAGNOSIS — C801 Malignant (primary) neoplasm, unspecified: Principal | ICD-10-CM

## 2016-05-24 SURGERY — EGD (ESOPHAGOGASTRODUODENOSCOPY)
Anesthesia: Moderate Sedation

## 2016-05-24 SURGERY — ESOPHAGOGASTRODUODENOSCOPY (EGD) WITH PROPOFOL

## 2016-05-24 MED ORDER — ONDANSETRON HCL 8 MG PO TABS: 8.0000 mg | ORAL_TABLET | Freq: Three times a day (TID) | ORAL | Status: DC | PRN

## 2016-05-24 MED ORDER — SODIUM CHLORIDE 0.9 % IV SOLN: INTRAVENOUS | Status: DC

## 2016-05-24 MED ORDER — PROCHLORPERAZINE MALEATE 10 MG PO TABS: 10.0000 mg | ORAL_TABLET | Freq: Four times a day (QID) | ORAL | Status: DC | PRN

## 2016-05-24 NOTE — OR Nursing (Signed)
Patient showed up for EGD and stated he ate "about 4 grapes" prior to arriving. Dr. Gala Romney notified and said procedure needs to be rescheduled. Patient to arrive tomorrow 05/25/16 at 0800 for procedure. Stressed the importance to the patient and the patient's fiance about not eating any solid foods after 8pm tonight and they verbalized understanding.

## 2016-05-25 ENCOUNTER — Encounter (HOSPITAL_COMMUNITY): Payer: Self-pay

## 2016-05-25 ENCOUNTER — Encounter (HOSPITAL_COMMUNITY)
Admission: RE | Disposition: A | Discharge status: Home / Self Care | Payer: Self-pay | Source: Ambulatory Visit | Attending: Internal Medicine

## 2016-05-25 ENCOUNTER — Ambulatory Visit (HOSPITAL_COMMUNITY): Admission: RE | Admit: 2016-05-25 | Payer: 59 | Source: Ambulatory Visit | Admitting: Internal Medicine

## 2016-05-25 ENCOUNTER — Other Ambulatory Visit (HOSPITAL_COMMUNITY)
Admission: RE | Admit: 2016-05-25 | Discharge: 2016-05-25 | Disposition: A | Discharge status: Home / Self Care | Payer: 59 | Source: Ambulatory Visit | Attending: Internal Medicine | Admitting: Internal Medicine

## 2016-05-25 ENCOUNTER — Other Ambulatory Visit: Payer: Self-pay | Admitting: General Surgery

## 2016-05-25 ENCOUNTER — Ambulatory Visit (HOSPITAL_COMMUNITY)
Admission: RE | Admit: 2016-05-25 | Discharge: 2016-05-25 | Disposition: A | Discharge status: Home / Self Care | Payer: 59 | Source: Ambulatory Visit | Attending: Internal Medicine | Admitting: Internal Medicine

## 2016-05-25 DIAGNOSIS — R933 Abnormal findings on diagnostic imaging of other parts of digestive tract: Secondary | ICD-10-CM

## 2016-05-25 DIAGNOSIS — I10 Essential (primary) hypertension: Secondary | ICD-10-CM | POA: Insufficient documentation

## 2016-05-25 DIAGNOSIS — I8500 Esophageal varices without bleeding: Secondary | ICD-10-CM | POA: Insufficient documentation

## 2016-05-25 DIAGNOSIS — Z8719 Personal history of other diseases of the digestive system: Secondary | ICD-10-CM | POA: Diagnosis not present

## 2016-05-25 DIAGNOSIS — C159 Malignant neoplasm of esophagus, unspecified: Secondary | ICD-10-CM | POA: Diagnosis not present

## 2016-05-25 DIAGNOSIS — K319 Disease of stomach and duodenum, unspecified: Secondary | ICD-10-CM | POA: Insufficient documentation

## 2016-05-25 DIAGNOSIS — K229 Disease of esophagus, unspecified: Secondary | ICD-10-CM

## 2016-05-25 DIAGNOSIS — Z79899 Other long term (current) drug therapy: Secondary | ICD-10-CM | POA: Diagnosis not present

## 2016-05-25 DIAGNOSIS — C787 Secondary malignant neoplasm of liver and intrahepatic bile duct: Secondary | ICD-10-CM | POA: Diagnosis not present

## 2016-05-25 HISTORY — PX: ESOPHAGOGASTRODUODENOSCOPY: SHX5428

## 2016-05-25 SURGERY — EGD (ESOPHAGOGASTRODUODENOSCOPY)
Anesthesia: Moderate Sedation

## 2016-05-25 MED ORDER — SODIUM CHLORIDE 0.9 % IV SOLN: INTRAVENOUS | Status: DC

## 2016-05-25 MED ORDER — LIDOCAINE VISCOUS 2 % MT SOLN
OROMUCOSAL | Status: DC | PRN
  Administered 2016-05-25: 1 via OROMUCOSAL

## 2016-05-25 MED ORDER — MEPERIDINE HCL 100 MG/ML IJ SOLN
INTRAMUSCULAR | Status: DC | PRN
Start: 2016-05-25 — End: 2016-05-25
  Administered 2016-05-25: 50 mg via INTRAVENOUS
  Administered 2016-05-25: 25 mg via INTRAVENOUS

## 2016-05-25 MED ORDER — ONDANSETRON HCL 4 MG/2ML IJ SOLN
INTRAMUSCULAR | Status: AC
  Filled 2016-05-25: qty 2

## 2016-05-25 MED ORDER — ONDANSETRON HCL 4 MG/2ML IJ SOLN
INTRAMUSCULAR | Status: DC | PRN
  Administered 2016-05-25: 4 mg via INTRAVENOUS

## 2016-05-25 MED ORDER — LIDOCAINE VISCOUS 2 % MT SOLN
OROMUCOSAL | Status: AC
  Filled 2016-05-25: qty 15

## 2016-05-25 MED ORDER — MIDAZOLAM HCL 5 MG/5ML IJ SOLN
INTRAMUSCULAR | Status: AC
  Filled 2016-05-25: qty 10

## 2016-05-25 MED ORDER — MIDAZOLAM HCL 5 MG/5ML IJ SOLN
INTRAMUSCULAR | Status: DC | PRN
  Administered 2016-05-25 (×2): 2 mg via INTRAVENOUS

## 2016-05-25 MED ORDER — MEPERIDINE HCL 100 MG/ML IJ SOLN
INTRAMUSCULAR | Status: AC
  Filled 2016-05-25: qty 2

## 2016-05-25 NOTE — Discharge Instructions (Signed)
EGD Discharge instructions Please read the instructions outlined below and refer to this sheet in the next few weeks. These discharge instructions provide you with general information on caring for yourself after you leave the hospital. Your doctor may also give you specific instructions. While your treatment has been planned according to the most current medical practices available, unavoidable complications occasionally occur. If you have any problems or questions after discharge, please call your doctor. ACTIVITY  You may resume your regular activity but move at a slower pace for the next 24 hours.   Take frequent rest periods for the next 24 hours.   Walking will help expel (get rid of) the air and reduce the bloated feeling in your abdomen.   No driving for 24 hours (because of the anesthesia (medicine) used during the test).   You may shower.   Do not sign any important legal documents or operate any machinery for 24 hours (because of the anesthesia used during the test).  NUTRITION  Drink plenty of fluids.   You may resume your normal diet.   Begin with a light meal and progress to your normal diet.   Avoid alcoholic beverages for 24 hours or as instructed by your caregiver.  MEDICATIONS  You may resume your normal medications unless your caregiver tells you otherwise.  WHAT YOU CAN EXPECT TODAY  You may experience abdominal discomfort such as a feeling of fullness or gas pains.  FOLLOW-UP  Your doctor will discuss the results of your test with you.  SEEK IMMEDIATE MEDICAL ATTENTION IF ANY OF THE FOLLOWING OCCUR:  Excessive nausea (feeling sick to your stomach) and/or vomiting.   Severe abdominal pain and distention (swelling).   Trouble swallowing.   Temperature over 101 F (37.8 C).   Rectal bleeding or vomiting of blood.    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 Ileene Rubens run and continue with her  recommendations  I will be in touch with you once I get the biopsies back

## 2016-05-25 NOTE — Op Note (Signed)
[facility] Patient Name: Joshua Greene Procedure Date: 05/25/2016 8:14 AM MRN: [id number] Date of Birth: September 19, 1961 Attending MD: Norvel Richards , MD CSN: [id number] Age: 55 Admit Type: Outpatient Procedure:                Upper GI endoscopy with biopsy Indications:              Abnormal CT of the GI tract Providers:                Norvel Richards, MD, Lurline Del, RN, Isabella Stalling, Technician Referring MD:              Medicines:                Midazolam 4 mg IV, Meperidine 75 mg IV Complications:            No immediate complications. Estimated Blood Loss:     Estimated blood loss was minimal. Procedure:                Pre-Anesthesia Assessment:                           - Prior to the procedure, a History and Physical                            was performed, and patient medications and                            allergies were reviewed. The patient's tolerance of                            previous anesthesia was also reviewed. The risks                            and benefits of the procedure and the sedation                            options and risks were discussed with the patient.                            All questions were answered, and informed consent                            was obtained. Prior Anticoagulants: The patient has                            taken no previous anticoagulant or antiplatelet                            agents. ASA Grade Assessment: IV - A patient with                            severe systemic disease that is a constant threat  to life. After reviewing the risks and benefits,                            the patient was deemed in satisfactory condition to                            undergo the procedure.                           After obtaining informed consent, the endoscope was                            passed under direct vision. Throughout the      procedure, the patient's blood pressure, pulse, and                            oxygen saturations were monitored continuously. The                            [id number] ([id number]) scope was introduced through the                            mouth, and advanced to the second part of duodenum.                            The upper GI endoscopy was accomplished without                            difficulty. The patient tolerated the procedure                            well. The upper GI endoscopy was accomplished                            without difficulty. The patient tolerated the                            procedure well. Scope In: 0:24:09 AM Scope Out: 9:07:45 AM Total Procedure Duration: 0 hours 11 minutes 1 second  Findings:      4 columns of grade 2-3 esophageal varices extending and involving the       distal two thirds the tubular esophagus. No bleeding stigmata. The GE       junction was located 43 cm from the incisors. There was a 3-4 cm area of       punched out ulceration approximately 33-36 cm from the incisors or       approximately 10 cm above the GE junction. This appeared to be       neoplastic. There was no Barrett's epithelium seen in the esophagus.      Diffuse moderately erythematous mucosa was found in the stomach.      Multiple 4 mm erosions were found in the stomach. no gastric ulcer or       infiltrating process observed.      The bulb and second portion of the duodenum was normal. Multiple  biopsies of the esophageal mass taken. Impression:               - Erythematous mucosa in the stomach.                           - Erosive gastropathy.                           - normal duodenum through second portion..                           - ulcerating esophag lesion - status post biopsy.                            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 Moderate Sedation:      Moderate (conscious) sedation was administered by the endoscopy nurse       and supervised by the endoscopist. The following parameters were       monitored: oxygen saturation, heart rate, blood pressure, respiratory       rate, EKG, adequacy of pulmonary ventilation, and response to care.       Total physician intraservice time was 16 minutes. Recommendation:           - Advance diet as tolerated.                           - Patient has a contact number available for                            emergencies. The signs and symptoms of potential                            delayed complications were discussed with the                            patient. Return to normal activities tomorrow.                            Written discharge instructions were provided to the                            patient.                           - Patient has a contact number available for                            emergencies. The signs and symptoms of potential  delayed complications were discussed with the                            patient. Return to normal activities tomorrow.                            Written discharge instructions were provided to the                            patient.                           - Advance diet as tolerated.                           - Continue present medications.                           - Await pathology results.                           - No repeat upper endoscopy.                           - Return to GI office PRN. Procedure Code(s):        --- Professional ---                           [id number], Esophagogastroduodenoscopy, flexible,                            transoral; diagnostic, including collection of                            specimen(s) by brushing or washing, when performed                            (separate procedure)                           99152,  Moderate sedation services provided by the                            same physician or other qualified health care                            professional performing the diagnostic or                            therapeutic service that the sedation supports,                            requiring the presence of an independent trained                            observer to assist in the monitoring of the  patient's level of consciousness and physiological                            status; initial 15 minutes of intraservice time,                            patient age 53 years or older Diagnosis Code(s):        --- Professional ---                           K31.89, Other diseases of stomach and duodenum                           R93.3, Abnormal findings on diagnostic imaging of                            other parts of digestive tract CPT copyright 2016 [employer]. All rights reserved. The codes documented in this report are preliminary and upon coder review may  be revised to meet current compliance requirements. Cristopher Estimable. Dwan Fennel, MD Norvel Richards, MD 05/25/2016 9:52:40 AM This report has been signed electronically. Number of Addenda: 0

## 2016-05-25 NOTE — H&P (Signed)
$'@LOGO'Q$ @   Primary Care Physician:  [name], NP Primary Gastroenterologist:  Dr. Gala Romney  Pre-Procedure History & Physical: HPI:  Joshua Greene is a 55 y.o. 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 Origin possibly squamous cell from esophagus or stomach. Dr. Whitney Muse has referred him to me to undergo an EGD. Patient has had progressive anorexia, early satiety, lower extremity edema and right upper quadrant abdominal pain. CT scan demonstrated an abnormal liver for which biopsy was performed. Patient denies reflux symptoms; currently he denies dysphagia or odynophagia. He does have early satiety and right upper quadrant abdominal pain. He hasn't had any melena or rectal bleeding. No family history of GI malignancy.  Patient is not sure whether or not he's had a colonoscopy in the past or not. No prior upper GI tract evaluation.  Past Medical History  Diagnosis Date  . Hypertension   . History of stomach ulcers   . Chicken pox   . Alcohol abuse   . Chronic bronchitis (Short)   . Erectile dysfunction     Past Surgical History  Procedure Laterality Date  . Appendectomy  1978  . Hernia repair      Prior to Admission medications   Medication Sig Start Date End Date Taking? Authorizing Provider  albuterol (PROVENTIL HFA;VENTOLIN HFA) 108 (90 Base) MCG/ACT inhaler Inhale 2 puffs into the lungs every 6 (six) hours as needed for wheezing or shortness of breath. 03/25/16  Yes Johnn Hai, PA-C  benzonatate (TESSALON PERLES) 100 MG capsule Take 2 capsules (200 mg total) by mouth 3 (three) times daily as needed for cough. 03/25/16 03/25/17 Yes Johnn Hai, PA-C  dextromethorphan-guaiFENesin (MUCINEX DM) 30-600 MG 12hr tablet Take 1 tablet by mouth 2 (two) times daily. 02/09/16  Yes Fredia Sorrow, MD  disulfiram (ANTABUSE) 250 MG tablet Take 1 tablet (250 mg total) by mouth  daily. 08/02/15  Yes Jearld Fenton, NP  Disulfiram 500 MG TABS Take 1 tablet (500 mg total) by mouth daily. 08/02/15  Yes Jearld Fenton, NP  furosemide (LASIX) 20 MG tablet Take 20 mg by mouth daily.   Yes Historical Provider, MD  loperamide (IMODIUM A-D) 2 MG tablet Take 1 tablet (2 mg total) by mouth 4 (four) times daily as needed for diarrhea or loose stools. 02/09/16  Yes Fredia Sorrow, MD  morphine (MSIR) 15 MG tablet Take 1 tablet (15 mg total) by mouth every 4 (four) hours as needed for severe pain. 05/19/16  Yes Patrici Ranks, MD  ondansetron (ZOFRAN) 8 MG tablet Take 1 tablet (8 mg total) by mouth every 8 (eight) hours as needed for nausea or vomiting. 05/24/16  Yes Patrici Ranks, MD  oxycodone (OXY-IR) 5 MG capsule Take 5 mg by mouth every 4 (four) hours as needed for pain.   Yes Historical Provider, MD  potassium chloride SA (K-DUR,KLOR-CON) 20 MEQ tablet Take 20 mEq by mouth daily.   Yes Historical Provider, MD  prochlorperazine (COMPAZINE) 10 MG tablet Take 1 tablet (10 mg total) by mouth every 6 (six) hours as needed for nausea or vomiting. 05/24/16  Yes Patrici Ranks, MD  sildenafil (REVATIO) 20 MG tablet Take 2-5 tablets as needed for sexual activity 08/05/15  Yes Jearld Fenton, NP    Allergies as of 05/25/2016  . (No Known Allergies)    Family History  Problem Relation Age of Onset  . Hypertension Mother   .  Diabetes Mother   . Cancer Neg Hx   . Heart disease Neg Hx   . Stroke Neg Hx     Social History   Social History  . Marital Status: Single    Spouse Name: N/A  . Number of Children: N/A  . Years of Education: N/A   Occupational History  . Not on file.   Social History Main Topics  . Smoking status: Never Smoker   . Smokeless tobacco: Current User    Types: Chew     Comment: 5 years  . Alcohol Use: Yes     Comment: occ  . Drug Use: No  . Sexual Activity: Yes   Other Topics Concern  . Not on file   Social History Narrative    Review of  Systems: See HPI, otherwise negative ROS  Physical Exam: BP 123/90 mmHg  Pulse 99  Temp(Src) 98 F (36.7 C) (Oral)  Resp 16  SpO2 99% General:   Chronically ill, somewhat cachectic appearing; pleasant and cooperative in NAD Lungs:  Clear throughout to auscultation.   No wheezes, crackles, or rhonchi. No acute distress. Heart:  Regular rate and rhythm; no murmurs, clicks, rubs,  or gallops. Abdomen: Full. Positive bowel sounds tender right upper quadrant. Liver edge indistinct. Some shifting dullness. No mass otherwise. Extremities:  Without clubbing or edema.  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 There is suspicion based on pathology could be arising from the esophagus or stomach. Consequently, EGD has been requested.  He actually presented for EGD yesterday, however, he ate a handful of grapes just before presenting for the procedure; - it had to be canceled. He is here today NPO ready to have the procedure performed.  The risks, benefits, limitations, alternatives and imponderables have been reviewed with the patient. Potential for esophageal dilation, biopsy, etc. have also been reviewed.  Questions have been answered. All parties agreeable.     Notice: This dictation was prepared with Dragon dictation along with smaller phrase technology. Any transcriptional errors that result from this process are unintentional and may not be corrected upon review.

## 2016-05-26 ENCOUNTER — Encounter (HOSPITAL_COMMUNITY)
Admission: RE | Admit: 2016-05-26 | Discharge: 2016-05-26 | Disposition: A | Discharge status: Home / Self Care | Payer: 59 | Source: Ambulatory Visit | Attending: Oncology | Admitting: Oncology

## 2016-05-26 ENCOUNTER — Other Ambulatory Visit: Payer: Self-pay | Admitting: Radiology

## 2016-05-26 DIAGNOSIS — K7689 Other specified diseases of liver: Secondary | ICD-10-CM | POA: Insufficient documentation

## 2016-05-26 DIAGNOSIS — C3490 Malignant neoplasm of unspecified part of unspecified bronchus or lung: Secondary | ICD-10-CM | POA: Diagnosis not present

## 2016-05-26 DIAGNOSIS — K769 Liver disease, unspecified: Secondary | ICD-10-CM

## 2016-05-26 LAB — GLUCOSE, CAPILLARY: Glucose-Capillary: 89 mg/dL (ref 65–99)

## 2016-05-26 MED ORDER — FLUDEOXYGLUCOSE F - 18 (FDG) INJECTION
9.1000 | Freq: Once | INTRAVENOUS | Status: AC | PRN
  Administered 2016-05-26: 9.1 via INTRAVENOUS

## 2016-05-29 ENCOUNTER — Other Ambulatory Visit (HOSPITAL_COMMUNITY): Payer: Self-pay | Admitting: Hematology & Oncology

## 2016-05-29 ENCOUNTER — Ambulatory Visit (HOSPITAL_COMMUNITY)
Admission: RE | Admit: 2016-05-29 | Discharge: 2016-05-29 | Disposition: A | Discharge status: Home / Self Care | Payer: 59 | Source: Ambulatory Visit | Attending: Oncology | Admitting: Oncology

## 2016-05-29 ENCOUNTER — Other Ambulatory Visit (HOSPITAL_COMMUNITY): Payer: Self-pay | Admitting: *Deleted

## 2016-05-29 ENCOUNTER — Other Ambulatory Visit (HOSPITAL_COMMUNITY): Payer: Self-pay | Admitting: Oncology

## 2016-05-29 ENCOUNTER — Encounter (HOSPITAL_COMMUNITY): Payer: Self-pay

## 2016-05-29 ENCOUNTER — Ambulatory Visit (HOSPITAL_COMMUNITY): Payer: 59 | Admitting: Hematology & Oncology

## 2016-05-29 ENCOUNTER — Ambulatory Visit (HOSPITAL_COMMUNITY)
Admission: RE | Admit: 2016-05-29 | Discharge: 2016-05-29 | Disposition: A | Discharge status: Home / Self Care | Payer: 59 | Source: Ambulatory Visit | Attending: Hematology & Oncology | Admitting: Hematology & Oncology

## 2016-05-29 DIAGNOSIS — C7802 Secondary malignant neoplasm of left lung: Secondary | ICD-10-CM | POA: Diagnosis not present

## 2016-05-29 DIAGNOSIS — C801 Malignant (primary) neoplasm, unspecified: Secondary | ICD-10-CM | POA: Insufficient documentation

## 2016-05-29 DIAGNOSIS — K769 Liver disease, unspecified: Secondary | ICD-10-CM

## 2016-05-29 DIAGNOSIS — C787 Secondary malignant neoplasm of liver and intrahepatic bile duct: Secondary | ICD-10-CM

## 2016-05-29 DIAGNOSIS — C7801 Secondary malignant neoplasm of right lung: Secondary | ICD-10-CM | POA: Diagnosis not present

## 2016-05-29 DIAGNOSIS — Z5111 Encounter for antineoplastic chemotherapy: Secondary | ICD-10-CM | POA: Diagnosis not present

## 2016-05-29 DIAGNOSIS — C7951 Secondary malignant neoplasm of bone: Secondary | ICD-10-CM | POA: Insufficient documentation

## 2016-05-29 DIAGNOSIS — C155 Malignant neoplasm of lower third of esophagus: Secondary | ICD-10-CM

## 2016-05-29 LAB — CBC WITH DIFFERENTIAL/PLATELET
Basophils Absolute: 0.1 10*3/uL (ref 0.0–0.1)
Basophils Relative: 1 %
EOS ABS: 0 10*3/uL (ref 0.0–0.7)
EOS PCT: 1 %
HCT: 42.9 % (ref 39.0–52.0)
Hemoglobin: 15 g/dL (ref 13.0–17.0)
LYMPHS ABS: 1.4 10*3/uL (ref 0.7–4.0)
LYMPHS PCT: 16 %
MCH: 30.1 pg (ref 26.0–34.0)
MCHC: 35 g/dL (ref 30.0–36.0)
MCV: 86.1 fL (ref 78.0–100.0)
MONO ABS: 1.5 10*3/uL — AB (ref 0.1–1.0)
Monocytes Relative: 17 %
Neutro Abs: 5.9 10*3/uL (ref 1.7–7.7)
Neutrophils Relative %: 67 %
PLATELETS: 200 10*3/uL (ref 150–400)
RBC: 4.98 MIL/uL (ref 4.22–5.81)
RDW: 17.4 % — AB (ref 11.5–15.5)
WBC: 8.8 10*3/uL (ref 4.0–10.5)

## 2016-05-29 LAB — BASIC METABOLIC PANEL
Anion gap: 11 (ref 5–15)
BUN: 9 mg/dL (ref 6–20)
CHLORIDE: 94 mmol/L — AB (ref 101–111)
CO2: 27 mmol/L (ref 22–32)
CREATININE: 0.75 mg/dL (ref 0.61–1.24)
Calcium: 8.5 mg/dL — ABNORMAL LOW (ref 8.9–10.3)
GFR calc Af Amer: >60 mL/min (ref >60)
GLUCOSE: 77 mg/dL (ref 65–99)
POTASSIUM: 4.3 mmol/L (ref 3.5–5.1)
SODIUM: 132 mmol/L — AB (ref 135–145)

## 2016-05-29 LAB — PROTIME-INR
INR: 1.35 (ref 0.00–1.49)
PROTHROMBIN TIME: 16.3 s — AB (ref 11.6–15.2)

## 2016-05-29 MED ORDER — HEPARIN SOD (PORK) LOCK FLUSH 100 UNIT/ML IV SOLN
INTRAVENOUS | Status: AC
  Filled 2016-05-29: qty 5

## 2016-05-29 MED ORDER — HEPARIN SOD (PORK) LOCK FLUSH 100 UNIT/ML IV SOLN
INTRAVENOUS | Status: AC | PRN
  Administered 2016-05-29: 500 [IU]

## 2016-05-29 MED ORDER — FENTANYL CITRATE (PF) 100 MCG/2ML IJ SOLN
INTRAMUSCULAR | Status: AC
  Filled 2016-05-29: qty 2

## 2016-05-29 MED ORDER — CEFAZOLIN SODIUM-DEXTROSE 2-4 GM/100ML-% IV SOLN
2.0000 g | INTRAVENOUS | Status: AC
  Administered 2016-05-29: 2 g via INTRAVENOUS
  Filled 2016-05-29: qty 100

## 2016-05-29 MED ORDER — MIDAZOLAM HCL 2 MG/2ML IJ SOLN
INTRAMUSCULAR | Status: AC | PRN
  Administered 2016-05-29: 1 mg via INTRAVENOUS
  Administered 2016-05-29 (×2): 0.5 mg via INTRAVENOUS

## 2016-05-29 MED ORDER — MIDAZOLAM HCL 2 MG/2ML IJ SOLN
INTRAMUSCULAR | Status: AC
  Filled 2016-05-29: qty 4

## 2016-05-29 MED ORDER — LIDOCAINE-PRILOCAINE 2.5-2.5 % EX CREA
TOPICAL_CREAM | CUTANEOUS | Status: DC
Start: 2016-05-29 — End: 2016-11-01

## 2016-05-29 MED ORDER — FENTANYL CITRATE (PF) 100 MCG/2ML IJ SOLN
INTRAMUSCULAR | Status: AC | PRN
  Administered 2016-05-29 (×3): 25 ug via INTRAVENOUS

## 2016-05-29 MED ORDER — LIDOCAINE-EPINEPHRINE (PF) 2 %-1:200000 IJ SOLN
INTRAMUSCULAR | Status: AC
  Filled 2016-05-29: qty 20

## 2016-05-29 NOTE — Sedation Documentation (Signed)
Patient denies pain and is resting comfortably.  

## 2016-05-29 NOTE — Discharge Instructions (Signed)
Implanted Port Insertion, Care After °Refer to this sheet in the next few weeks. These instructions provide you with information on caring for yourself after your procedure. Your health care provider may also give you more specific instructions. Your treatment has been planned according to current medical practices, but problems sometimes occur. Call your health care provider if you have any problems or questions after your procedure. °WHAT TO EXPECT AFTER THE PROCEDURE °After your procedure, it is typical to have the following:  °· Discomfort at the port insertion site. Ice packs to the area will help. °· Bruising on the skin over the port. This will subside in 3-4 days. °HOME CARE INSTRUCTIONS °· After your port is placed, you will get a manufacturer's information card. The card has information about your port. Keep this card with you at all times.   °· Know what kind of port you have. There are many types of ports available.   °· Wear a medical alert bracelet in case of an emergency. This can help alert health care workers that you have a port.   °· The port can stay in for as long as your health care provider believes it is necessary.   °· A home health care nurse may give medicines and take care of the port.   °· You or a family member can get special training and directions for giving medicine and taking care of the port at home.   °SEEK MEDICAL CARE IF:  °· Your port does not flush or you are unable to get a blood return.   °· You have a fever or chills. °SEEK IMMEDIATE MEDICAL CARE IF: °· You have new fluid or pus coming from your incision.   °· You notice a bad smell coming from your incision site.   °· You have swelling, pain, or more redness at the incision or port site.   °· You have chest pain or shortness of breath. °  °This information is not intended to replace advice given to you by your health care provider. Make sure you discuss any questions you have with your health care provider. °  °Document  Released: 10/01/2013 Document Revised: 12/16/2013 Document Reviewed: 10/01/2013 °Elsevier Interactive Patient Education ©2016 [employer] °Implanted Port Home Guide °An implanted port is a type of central line that is placed under the skin. Central lines are used to provide IV access when treatment or nutrition needs to be given through a person's veins. Implanted ports are used for long-term IV access. An implanted port may be placed because:  °· You need IV medicine that would be irritating to the small veins in your hands or arms.   °· You need long-term IV medicines, such as antibiotics.   °· You need IV nutrition for a long period.   °· You need frequent blood draws for lab tests.   °· You need dialysis.   °Implanted ports are usually placed in the chest area, but they can also be placed in the upper arm, the abdomen, or the leg. An implanted port has two main parts:  °· Reservoir. The reservoir is round and will appear as a small, raised area under your skin. The reservoir is the part where a needle is inserted to give medicines or draw blood.   °· Catheter. The catheter is a thin, flexible tube that extends from the reservoir. The catheter is placed into a large vein. Medicine that is inserted into the reservoir goes into the catheter and then into the vein.   °HOW WILL I CARE FOR MY INCISION SITE? °Do not get the   incision site wet. Bathe or shower as directed by your health care provider.  °HOW IS MY PORT ACCESSED? °Special steps must be taken to access the port:  °· Before the port is accessed, a numbing cream can be placed on the skin. This helps numb the skin over the port site.   °· Your health care provider uses a sterile technique to access the port. °· Your health care provider must put on a mask and sterile gloves. °· The skin over your port is cleaned carefully with an antiseptic and allowed to dry. °· The port is gently pinched between sterile gloves, and a needle is inserted into the  port. °· Only "non-coring" port needles should be used to access the port. Once the port is accessed, a blood return should be checked. This helps ensure that the port is in the vein and is not clogged.   °· If your port needs to remain accessed for a constant infusion, a clear (transparent) bandage will be placed over the needle site. The bandage and needle will need to be changed every week, or as directed by your health care provider.   °· Keep the bandage covering the needle clean and dry. Do not get it wet. Follow your health care provider's instructions on how to take a shower or bath while the port is accessed.   °· If your port does not need to stay accessed, no bandage is needed over the port.   °WHAT IS FLUSHING? °Flushing helps keep the port from getting clogged. Follow your health care provider's instructions on how and when to flush the port. Ports are usually flushed with saline solution or a medicine called heparin. The need for flushing will depend on how the port is used.  °· If the port is used for intermittent medicines or blood draws, the port will need to be flushed:   °· After medicines have been given.   °· After blood has been drawn.   °· As part of routine maintenance.   °· If a constant infusion is running, the port may not need to be flushed.   °HOW LONG WILL MY PORT STAY IMPLANTED? °The port can stay in for as long as your health care provider thinks it is needed. When it is time for the port to come out, surgery will be done to remove it. The procedure is similar to the one performed when the port was put in.  °WHEN SHOULD I SEEK IMMEDIATE MEDICAL CARE? °When you have an implanted port, you should seek immediate medical care if:  °· You notice a bad smell coming from the incision site.   °· You have swelling, redness, or drainage at the incision site.   °· You have more swelling or pain at the port site or the surrounding area.   °· You have a fever that is not controlled with  medicine. °  °This information is not intended to replace advice given to you by your health care provider. Make sure you discuss any questions you have with your health care provider. °  °Document Released: 12/11/2005 Document Revised: 10/01/2013 Document Reviewed: 08/18/2013 °Elsevier Interactive Patient Education ©2016 [employer] ° ° °Moderate Conscious Sedation, Adult, Care After °Refer to this sheet in the next few weeks. These instructions provide you with information on caring for yourself after your procedure. Your health care provider may also give you more specific instructions. Your treatment has been planned according to current medical practices, but problems sometimes occur. Call your health care provider if you have any problems or questions   after your procedure. °WHAT TO EXPECT AFTER THE PROCEDURE  °After your procedure: °· You may feel sleepy, clumsy, and have poor balance for several hours. °· Vomiting may occur if you eat too soon after the procedure. °HOME CARE INSTRUCTIONS °· Do not participate in any activities where you could become injured for at least 24 hours. Do not: °¨ Drive. °¨ Swim. °¨ Ride a bicycle. °¨ Operate heavy machinery. °¨ Cook. °¨ Use power tools. °¨ Climb ladders. °¨ Work from a high place. °· Do not make important decisions or sign legal documents until you are improved. °· If you vomit, drink water, juice, or soup when you can drink without vomiting. Make sure you have little or no nausea before eating solid foods. °· Only take over-the-counter or prescription medicines for pain, discomfort, or fever as directed by your health care provider. °· Make sure you and your family fully understand everything about the medicines given to you, including what side effects may occur. °· You should not drink alcohol, take sleeping pills, or take medicines that cause drowsiness for at least 24 hours. °· If you smoke, do not smoke without supervision. °· If you are feeling better, you  may resume normal activities 24 hours after you were sedated. °· Keep all appointments with your health care provider. °SEEK MEDICAL CARE IF: °· Your skin is pale or bluish in color. °· You continue to feel nauseous or vomit. °· Your pain is getting worse and is not helped by medicine. °· You have bleeding or swelling. °· You are still sleepy or feeling clumsy after 24 hours. °SEEK IMMEDIATE MEDICAL CARE IF: °· You develop a rash. °· You have difficulty breathing. °· You develop any type of allergic problem. °· You have a fever. °MAKE SURE YOU: °· Understand these instructions. °· Will watch your condition. °· Will get help right away if you are not doing well or get worse. °  °This information is not intended to replace advice given to you by your health care provider. Make sure you discuss any questions you have with your health care provider. °  °Document Released: 10/01/2013 Document Revised: 01/01/2015 Document Reviewed: 10/01/2013 °Elsevier Interactive Patient Education ©2016 [employer] ° °

## 2016-05-29 NOTE — Progress Notes (Signed)
Patient ID: Joshua Greene, male   DOB: [birth year], 55 y.o.   MRN: [id number]    Referring Physician(s): [facility]  Supervising Physician: Sandi Mariscal  Patient Status: OP  Chief Complaint:  "I'm here for a port a cath"  Subjective: Patient familiar to IR service from recent image guided right hepatic lobe liver lesion biopsy on 05/16/16. 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 He currently denies fever, headache, chest pain, abnormal bleeding. He does have some dyspnea with exertion, occasional cough, dysphagia, abdominal/back pain, occasional nausea/vomiting, weight loss, fatigue, diminished appetite.   Allergies: Review of patient's allergies indicates no known allergies.  Medications: Prior to Admission medications   Medication Sig Start Date End Date Taking? Authorizing Provider  albuterol (PROVENTIL HFA;VENTOLIN HFA) 108 (90 Base) MCG/ACT inhaler Inhale 2 puffs into the lungs every 6 (six) hours as needed for wheezing or shortness of breath. 03/25/16  Yes Johnn Hai, PA-C  dextromethorphan-guaiFENesin (MUCINEX DM) 30-600 MG 12hr tablet Take 1 tablet by mouth 2 (two) times daily. 02/09/16  Yes Fredia Sorrow, MD  disulfiram (ANTABUSE) 250 MG tablet Take 1 tablet (250 mg total) by mouth daily. 08/02/15  Yes Jearld Fenton, NP  Disulfiram 500 MG TABS Take 1 tablet (500 mg total) by mouth daily. 08/02/15  Yes Jearld Fenton, NP  furosemide (LASIX) 20 MG tablet Take 20 mg by mouth daily.   Yes Historical Provider, MD  loperamide (IMODIUM A-D) 2 MG tablet Take 1 tablet (2 mg total) by mouth 4 (four) times daily as needed for diarrhea or loose stools. 02/09/16  Yes Fredia Sorrow, MD  morphine (MSIR) 15 MG tablet Take 1 tablet (15 mg total) by mouth every 4 (four) hours as needed for severe pain. 05/19/16  Yes Patrici Ranks, MD  ondansetron (ZOFRAN) 8 MG tablet Take 1 tablet  (8 mg total) by mouth every 8 (eight) hours as needed for nausea or vomiting. 05/24/16  Yes Patrici Ranks, MD  oxycodone (OXY-IR) 5 MG capsule Take 5 mg by mouth every 4 (four) hours as needed for pain.   Yes Historical Provider, MD  potassium chloride SA (K-DUR,KLOR-CON) 20 MEQ tablet Take 20 mEq by mouth daily.   Yes Historical Provider, MD  benzonatate (TESSALON PERLES) 100 MG capsule Take 2 capsules (200 mg total) by mouth 3 (three) times daily as needed for cough. 03/25/16 03/25/17  Johnn Hai, PA-C  prochlorperazine (COMPAZINE) 10 MG tablet Take 1 tablet (10 mg total) by mouth every 6 (six) hours as needed for nausea or vomiting. 05/24/16   Patrici Ranks, MD  sildenafil (REVATIO) 20 MG tablet Take 2-5 tablets as needed for sexual activity 08/05/15   Jearld Fenton, NP     Vital Signs: BP 124/84 mmHg  Pulse 101  Temp(Src) 98.2 F (36.8 C) (Oral)  Resp 16  Ht '6\' 2"'$  (1.88 m)  Wt 170 lb (77.111 kg)  BMI 21.82 kg/m2  SpO2 98%  Physical Exam cachectic-appearing black male; chest with diminished breath sounds right base, left clear. Heart with slightly tachycardic but regular rhythm. Abdomen distended, positive bowel sounds, mild diffuse tenderness; 2-3+ bilateral lower extremity edema.  Imaging: Nm Pet Image Initial (pi) Skull Base To Thigh  05/26/2016  CLINICAL DATA:  Initial treatment strategy for pulmonary nodules and hepatic lesions. EXAM: NUCLEAR MEDICINE PET SKULL BASE TO THIGH TECHNIQUE: 9.1 mCi F-18 FDG was injected intravenously. Full-ring PET imaging was  performed from the skull base to thigh after the radiotracer. CT data was obtained and used for attenuation correction and anatomic localization. FASTING BLOOD GLUCOSE:  Value: 89 mg/dl COMPARISON:  05/15/2016 FINDINGS: NECK No hypermetabolic lymph nodes in the neck. CHEST There is hypermetabolic activity in esophagus several cm above the gastroesophageal junction with SUV max equal 8.7. There is adjacent hypermetabolic  paraesophageal lymph node with SUV max equal 8.9. There bilateral ground-glass nodules which are hypermetabolic. For example RIGHT upper lobe nodule measuring 16 mm with SUV max equal 2.7 (image 71, series 4). Several additional solid nodules within the lungs which are also hypermetabolic. For example LEFT lower lobe nodule measuring 7 mm on image 79 with SUV max 2.5. There is hypermetabolic RIGHT hilar lymph nodes with SUV max equal 6.7. ABDOMEN/PELVIS There is confluent abnormal hypermetabolic activity with underlying nodularity throughout the liver consistent with widespread hepatic metastasis. Individual lesions multi measure measure. One region with intense activity in the RIGHT hepatic lobe with SUV max equal 9.0. Lesion in the LEFT hepatic lobe with SUV max equal 8.8. There is haziness within the abdominal mesentery. No hypermetabolic abdominal pelvic lymph nodes. Hypermetabolic prevascular lymph node in the precordial space with SUV max equal 7.6 SKELETON Focal activity in the spinous process of the T1 vertebral body and inferior sternum with SUV max equal 4.4 concerning for skeletal metastasis IMPRESSION: 1. Evidence of bilateral PULMONARY METASTASIS and widespread HEPATIC METASTASIS. 2. Focal metabolic activity in the distal esophagus is suggestive of PRIMARY ESOPHAGEAL CARCINOMA 3. Hypermetabolic lower paraesophageal metastatic lymph node. 4. Metastatic RIGHT hilar lymph node and precordial lymph node. 5. Evidence of skeletal metastasis in the spinous process of T1 and the inferior sternum. Electronically Signed   By: Suzy Bouchard M.D.   On: 05/26/2016 15:01    Labs:  CBC:  Recent Labs  08/02/15 1126 05/03/16 2231 05/16/16 1220 05/29/16 1202  WBC 8.3 8.6 7.7 8.8  HGB 15.0 14.7 13.7 15.0  HCT 44.9 44.5 41.5 42.9  PLT 264.0 211 245 200    COAGS:  Recent Labs  05/04/16 0106 05/15/16 1237 05/16/16 1220  INR 1.09 1.13 1.19  APTT 31  --   --     BMP:  Recent Labs   08/02/15 1126 05/03/16 2231 05/16/16 1220  NA 138 130* 131*  K 4.2 4.4 4.0  CL 99 93* 95*  CO2 '29 27 27  '$ GLUCOSE 99 92 118*  BUN '12 15 9  '$ CALCIUM 9.8 8.7* 8.6*  CREATININE 0.81 0.62 0.55*  GFRNONAA  --  >60 >60  GFRAA  --  >60 >60    LIVER FUNCTION TESTS:  Recent Labs  08/02/15 1126 05/03/16 2231 05/15/16 1237 05/16/16 1220  BILITOT 1.0 4.1* 3.2* 3.4*  AST 39* 326* 265* 266*  ALT 43 142* 110* 112*  ALKPHOS 68 643* 544* 473*  PROT 8.3 8.0 7.7 7.2  ALBUMIN 4.8 3.3* 2.7* 2.6*    Assessment and Plan: Patient with history of newly diagnosed 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 Status post recent liver lesion biopsy by IR on 05/16/16. Recent PET scan on 6/2 reveals evidence of bilateral pulmonary metastasis as well as widespread hepatic metastasis with focal metabolic activity in the distal esophagus suggestive of primary esophageal carcinoma. There is also hypermetabolic lower paraesophageal metastatic lymph node, metastatic right hilar node and precordial lymph node with evidence of skeletal metastasis in the spinous process of T1 and the inferior sternum. He presents again today for Port-A-Cath placement for chemotherapy.Risks and  benefits discussed with the patient including, but not limited to bleeding, infection, pneumothorax, or fibrin sheath development and need for additional procedures.All of the patient's questions were answered, patient is agreeable to proceed.Consent signed and in chart.     Electronically Signed: D. Rowe Robert 05/29/2016, 12:38 PM   I spent a total of 20 minutes at the the patient's bedside AND on the patient's hospital floor or unit, greater than 50% of which was counseling/coordinating care for Port-A-Cath placement

## 2016-05-29 NOTE — Procedures (Signed)
Successful placement of right IJ approach port-a-cath with tip at the superior caval atrial junction. The catheter is ready for immediate use. Estimated Blood Loss: None No immediate post procedural complications.  Ronny Bacon, MD Pager #: [phone]

## 2016-05-30 ENCOUNTER — Encounter (HOSPITAL_COMMUNITY): Payer: Self-pay | Admitting: Internal Medicine

## 2016-05-30 ENCOUNTER — Telehealth: Payer: Self-pay | Admitting: Gastroenterology

## 2016-05-30 ENCOUNTER — Encounter (HOSPITAL_COMMUNITY): Payer: 59 | Attending: Hematology & Oncology

## 2016-05-30 DIAGNOSIS — Z823 Family history of stroke: Secondary | ICD-10-CM | POA: Insufficient documentation

## 2016-05-30 DIAGNOSIS — C787 Secondary malignant neoplasm of liver and intrahepatic bile duct: Secondary | ICD-10-CM | POA: Insufficient documentation

## 2016-05-30 DIAGNOSIS — G893 Neoplasm related pain (acute) (chronic): Secondary | ICD-10-CM | POA: Insufficient documentation

## 2016-05-30 DIAGNOSIS — Z809 Family history of malignant neoplasm, unspecified: Secondary | ICD-10-CM | POA: Insufficient documentation

## 2016-05-30 DIAGNOSIS — R64 Cachexia: Secondary | ICD-10-CM | POA: Insufficient documentation

## 2016-05-30 DIAGNOSIS — J42 Unspecified chronic bronchitis: Secondary | ICD-10-CM | POA: Insufficient documentation

## 2016-05-30 DIAGNOSIS — N529 Male erectile dysfunction, unspecified: Secondary | ICD-10-CM | POA: Insufficient documentation

## 2016-05-30 DIAGNOSIS — Z9889 Other specified postprocedural states: Secondary | ICD-10-CM | POA: Insufficient documentation

## 2016-05-30 DIAGNOSIS — I10 Essential (primary) hypertension: Secondary | ICD-10-CM | POA: Insufficient documentation

## 2016-05-30 DIAGNOSIS — Z8249 Family history of ischemic heart disease and other diseases of the circulatory system: Secondary | ICD-10-CM | POA: Insufficient documentation

## 2016-05-30 DIAGNOSIS — Z720 Tobacco use: Secondary | ICD-10-CM | POA: Insufficient documentation

## 2016-05-30 DIAGNOSIS — R490 Dysphonia: Secondary | ICD-10-CM | POA: Insufficient documentation

## 2016-05-30 DIAGNOSIS — Z79899 Other long term (current) drug therapy: Secondary | ICD-10-CM | POA: Insufficient documentation

## 2016-05-30 DIAGNOSIS — Z8719 Personal history of other diseases of the digestive system: Secondary | ICD-10-CM | POA: Insufficient documentation

## 2016-05-30 DIAGNOSIS — Z833 Family history of diabetes mellitus: Secondary | ICD-10-CM | POA: Insufficient documentation

## 2016-05-30 NOTE — Telephone Encounter (Signed)
Dr. Gala Romney,   Dr. Orene Desanctis (pathologist) finishing up stains that Dr. Saralyn Pilar started on this patient. She was speaking to me about another patient and wanted me to update you on this case. Basically said that it was difficult to differentiate between adenocarcinoma and squamous cell at times. She inquired about patient's history. Discussed with her patient has metastatic disease to the lung and liver with esophageal tumor. She states report would be out later today hopefully.  She encouraged her to call if he had any questions, her numbers [phone]  Routing to Dr. Whitney Muse for [facility].

## 2016-05-30 NOTE — Telephone Encounter (Signed)
Communication noted.  

## 2016-05-31 ENCOUNTER — Encounter (HOSPITAL_BASED_OUTPATIENT_CLINIC_OR_DEPARTMENT_OTHER): Payer: 59

## 2016-05-31 ENCOUNTER — Encounter (HOSPITAL_BASED_OUTPATIENT_CLINIC_OR_DEPARTMENT_OTHER): Payer: 59 | Admitting: Hematology & Oncology

## 2016-05-31 ENCOUNTER — Inpatient Hospital Stay (HOSPITAL_COMMUNITY): Payer: 59

## 2016-05-31 ENCOUNTER — Encounter: Payer: Self-pay | Admitting: Dietician

## 2016-05-31 VITALS — BP 129/71 | HR 125 | Temp 97.6°F | Resp 20 | Wt 182.0 lb

## 2016-05-31 VITALS — BP 137/95 | HR 88 | Temp 98.4°F | Resp 20

## 2016-05-31 DIAGNOSIS — Z79899 Other long term (current) drug therapy: Secondary | ICD-10-CM | POA: Diagnosis not present

## 2016-05-31 DIAGNOSIS — Z720 Tobacco use: Secondary | ICD-10-CM | POA: Diagnosis not present

## 2016-05-31 DIAGNOSIS — C159 Malignant neoplasm of esophagus, unspecified: Secondary | ICD-10-CM | POA: Diagnosis not present

## 2016-05-31 DIAGNOSIS — Z8719 Personal history of other diseases of the digestive system: Secondary | ICD-10-CM | POA: Diagnosis not present

## 2016-05-31 DIAGNOSIS — C787 Secondary malignant neoplasm of liver and intrahepatic bile duct: Secondary | ICD-10-CM

## 2016-05-31 DIAGNOSIS — R490 Dysphonia: Secondary | ICD-10-CM | POA: Diagnosis not present

## 2016-05-31 DIAGNOSIS — J42 Unspecified chronic bronchitis: Secondary | ICD-10-CM | POA: Diagnosis not present

## 2016-05-31 DIAGNOSIS — G893 Neoplasm related pain (acute) (chronic): Secondary | ICD-10-CM | POA: Diagnosis not present

## 2016-05-31 DIAGNOSIS — C801 Malignant (primary) neoplasm, unspecified: Secondary | ICD-10-CM

## 2016-05-31 DIAGNOSIS — R64 Cachexia: Secondary | ICD-10-CM | POA: Diagnosis not present

## 2016-05-31 DIAGNOSIS — Z833 Family history of diabetes mellitus: Secondary | ICD-10-CM | POA: Diagnosis not present

## 2016-05-31 DIAGNOSIS — Z5111 Encounter for antineoplastic chemotherapy: Secondary | ICD-10-CM

## 2016-05-31 DIAGNOSIS — Z9889 Other specified postprocedural states: Secondary | ICD-10-CM | POA: Diagnosis not present

## 2016-05-31 DIAGNOSIS — Z7289 Other problems related to lifestyle: Secondary | ICD-10-CM

## 2016-05-31 DIAGNOSIS — Z8249 Family history of ischemic heart disease and other diseases of the circulatory system: Secondary | ICD-10-CM | POA: Diagnosis not present

## 2016-05-31 DIAGNOSIS — Z809 Family history of malignant neoplasm, unspecified: Secondary | ICD-10-CM | POA: Diagnosis not present

## 2016-05-31 DIAGNOSIS — C155 Malignant neoplasm of lower third of esophagus: Secondary | ICD-10-CM

## 2016-05-31 DIAGNOSIS — I10 Essential (primary) hypertension: Secondary | ICD-10-CM | POA: Diagnosis not present

## 2016-05-31 DIAGNOSIS — N529 Male erectile dysfunction, unspecified: Secondary | ICD-10-CM | POA: Diagnosis not present

## 2016-05-31 DIAGNOSIS — Z823 Family history of stroke: Secondary | ICD-10-CM | POA: Diagnosis not present

## 2016-05-31 LAB — COMPREHENSIVE METABOLIC PANEL
ALT: 90 U/L — ABNORMAL HIGH (ref 17–63)
ANION GAP: 10 (ref 5–15)
AST: 371 U/L — ABNORMAL HIGH (ref 15–41)
Albumin: 2.3 g/dL — ABNORMAL LOW (ref 3.5–5.0)
Alkaline Phosphatase: 451 U/L — ABNORMAL HIGH (ref 38–126)
BILIRUBIN TOTAL: 5.3 mg/dL — AB (ref 0.3–1.2)
BUN: 11 mg/dL (ref 6–20)
CALCIUM: 8.1 mg/dL — AB (ref 8.9–10.3)
CO2: 25 mmol/L (ref 22–32)
Chloride: 94 mmol/L — ABNORMAL LOW (ref 101–111)
Creatinine, Ser: 0.67 mg/dL (ref 0.61–1.24)
GFR calc Af Amer: >60 mL/min (ref >60)
Glucose, Bld: 105 mg/dL — ABNORMAL HIGH (ref 65–99)
POTASSIUM: 4 mmol/L (ref 3.5–5.1)
Sodium: 129 mmol/L — ABNORMAL LOW (ref 135–145)
TOTAL PROTEIN: 7.6 g/dL (ref 6.5–8.1)

## 2016-05-31 LAB — CBC WITH DIFFERENTIAL/PLATELET
Basophils Absolute: 0 10*3/uL (ref 0.0–0.1)
Basophils Relative: 0 %
Eosinophils Absolute: 0 10*3/uL (ref 0.0–0.7)
Eosinophils Relative: 0 %
HEMATOCRIT: 40.7 % (ref 39.0–52.0)
Hemoglobin: 13.6 g/dL (ref 13.0–17.0)
LYMPHS ABS: 1 10*3/uL (ref 0.7–4.0)
LYMPHS PCT: 12 %
MCH: 29.2 pg (ref 26.0–34.0)
MCHC: 33.4 g/dL (ref 30.0–36.0)
MCV: 87.3 fL (ref 78.0–100.0)
MONO ABS: 1.2 10*3/uL — AB (ref 0.1–1.0)
MONOS PCT: 15 %
NEUTROS ABS: 5.7 10*3/uL (ref 1.7–7.7)
Neutrophils Relative %: 73 %
Platelets: 192 10*3/uL (ref 150–400)
RBC: 4.66 MIL/uL (ref 4.22–5.81)
RDW: 17.4 % — AB (ref 11.5–15.5)
WBC: 7.9 10*3/uL (ref 4.0–10.5)

## 2016-05-31 MED ORDER — DEXTROSE 5 % IV SOLN
Freq: Once | INTRAVENOUS | Status: AC
  Administered 2016-05-31: 12:00:00 via INTRAVENOUS

## 2016-05-31 MED ORDER — OXALIPLATIN CHEMO INJECTION 100 MG/20ML
65.0000 mg/m2 | Freq: Once | INTRAVENOUS | Status: AC
  Administered 2016-05-31: 130 mg via INTRAVENOUS
  Filled 2016-05-31: qty 26

## 2016-05-31 MED ORDER — LEUCOVORIN CALCIUM INJECTION 350 MG
400.0000 mg/m2 | Freq: Once | INTRAVENOUS | Status: AC
  Administered 2016-05-31: 804 mg via INTRAVENOUS
  Filled 2016-05-31: qty 40.2

## 2016-05-31 MED ORDER — SODIUM CHLORIDE 0.9 % IV SOLN
20.0000 mg | Freq: Once | INTRAVENOUS | Status: AC
  Administered 2016-05-31: 20 mg via INTRAVENOUS
  Filled 2016-05-31: qty 2

## 2016-05-31 MED ORDER — FENTANYL 25 MCG/HR TD PT72
25.0000 ug | MEDICATED_PATCH | TRANSDERMAL | Status: DC
  Administered 2016-05-31: 25 ug via TRANSDERMAL
  Filled 2016-05-31: qty 1

## 2016-05-31 MED ORDER — SODIUM CHLORIDE 0.9 % IV SOLN
INTRAVENOUS | Status: DC
  Administered 2016-05-31: 09:00:00 via INTRAVENOUS

## 2016-05-31 MED ORDER — SODIUM CHLORIDE 0.9 % IV SOLN
1680.0000 mg/m2 | INTRAVENOUS | Status: DC
  Administered 2016-05-31: 3400 mg via INTRAVENOUS
  Filled 2016-05-31: qty 68

## 2016-05-31 MED ORDER — SODIUM CHLORIDE 0.9% FLUSH: 10.0000 mL | INTRAVENOUS | Status: DC | PRN

## 2016-05-31 MED ORDER — PALONOSETRON HCL INJECTION 0.25 MG/5ML
0.2500 mg | Freq: Once | INTRAVENOUS | Status: AC
  Administered 2016-05-31: 0.25 mg via INTRAVENOUS
  Filled 2016-05-31: qty 5

## 2016-05-31 MED ORDER — MORPHINE SULFATE 15 MG PO TABS
15.0000 mg | ORAL_TABLET | ORAL | Status: AC
  Administered 2016-05-31: 15 mg via ORAL
  Filled 2016-05-31: qty 1

## 2016-05-31 MED ORDER — DEXAMETHASONE 4 MG PO TABS: 4.0000 mg | ORAL_TABLET | Freq: Three times a day (TID) | ORAL | Status: DC

## 2016-05-31 MED ORDER — HEPARIN SOD (PORK) LOCK FLUSH 100 UNIT/ML IV SOLN
500.0000 [IU] | Freq: Once | INTRAVENOUS | Status: DC | PRN
  Filled 2016-05-31: qty 5

## 2016-05-31 NOTE — Progress Notes (Signed)
1450:  Tolerated tx w/o adverse reaction.  Home infusion pump connected and infusing.  A&Ox4, VSS.  Discharged via wheelchair in c/o family for transport home.

## 2016-05-31 NOTE — Progress Notes (Signed)
Pt attended chemo class on 05/30/16 and started chemo on 05/31/16.

## 2016-05-31 NOTE — Progress Notes (Signed)
[place]  Progress Note  Patient Care Team: Jearld Fenton, NP as PCP - General (Internal Medicine)  CHIEF COMPLAINTS:    Esophageal cancer ([place])   05/04/2016 Miscellaneous ED Visit secondary to bilateral ankle swelling   05/09/2016 Imaging Enlarged liver with innumerable hepatic hypodense lesions most c/w metastatic disease. Diffuse mesenteric edema as well as multiple enlarged mesenteric and retroperitoneal LN   05/15/2016 Imaging Two posterior RUL nodules, indeterminate, 17 mm short axis RL paraesophageal node worrisome for nodal metastasis. Site of primary remains indeterminate   05/16/2016 Initial Biopsy Ultrasound guided biopsy of R sided liver lesion   05/16/2016 Pathology Results Metastatic poorly differentiated carcinoma, immunoprofile not entirely specific. Favor poorly differentiated squamous cell carcinoma,    05/25/2016 Procedure EGD with Dr. Gala Romney with esophageal varices, area of punch out ulceration that appears to be neoplastic   05/25/2016 Pathology Results The neoplasm stained positive for CK5/6, p63 (squamous cell markers), MOC-31, CK7 (patchy). CDX-2 (weak), TTF-1 (non specific stain), and negative stains for CK20 and [place] A. The immunostain pattern and morphologyfavored poorly diff sq cell carcinoma esoph    HISTORY OF PRESENTING ILLNESS:  Joshua Greene 55 y.o. 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 He has significant liver metastases.  Mr. Lamere is accompanied by his fiance. I personally reviewed and went over laboratory and pathology results with the patient at length. I personally reviewed and went over EGD and imaging studies with the patient.  He reports severe stomach pain and dark urine. Oral intake remains poor. He is anxious to begin therapy. I have discussed my concerns regarding therapy and his ability to tolerate therapy but he strongly desires to try treatment. We have opted for  dose reduced FOLFOX.   He is here today to begin.  MEDICAL HISTORY:  Past Medical History  Diagnosis Date  . Hypertension   . History of stomach ulcers   . Chicken pox   . Alcohol abuse   . Chronic bronchitis ([place])   . Erectile dysfunction     SURGICAL HISTORY: Past Surgical History  Procedure Laterality Date  . Appendectomy  1978  . Hernia repair    . Esophagogastroduodenoscopy N/A 05/25/2016    Procedure: ESOPHAGOGASTRODUODENOSCOPY (EGD);  Surgeon: Daneil Dolin, MD;  Location: [facility];  Service: Endoscopy;  Laterality: N/A;    SOCIAL HISTORY: Social History   Social History  . Marital Status: Single    Spouse Name: N/A  . Number of Children: N/A  . Years of Education: N/A   Occupational History  . Not on file.   Social History Main Topics  . Smoking status: Never Smoker   . Smokeless tobacco: Current User    Types: Chew     Comment: 5 years  . Alcohol Use: Yes     Comment: occ  . Drug Use: No  . Sexual Activity: Yes   Other Topics Concern  . Not on file   Social History Narrative   Fiance, engaged for 3 years and known each other their whole lives 1 son 1 grandchild Non smoker ETOH, a lot of beer. He has quit drinking for over a month.  Chews tobacco. He works in the [employer] and has for 3-4 months  FAMILY HISTORY: Family History  Problem Relation Age of Onset  . Hypertension Mother   . Diabetes Mother   . Cancer Neg Hx   . Heart disease Neg Hx   .  Stroke Neg Hx    Mother is still living at 39 yo Father is still living at 61 yo 3 sisters and 1 brother, healthy  ALLERGIES:  has No Known Allergies.  MEDICATIONS:  Current Outpatient Prescriptions  Medication Sig Dispense Refill  . albuterol (PROVENTIL HFA;VENTOLIN HFA) 108 (90 Base) MCG/ACT inhaler Inhale 2 puffs into the lungs every 6 (six) hours as needed for wheezing or shortness of breath. 1 Inhaler 2  . benzonatate (TESSALON PERLES) 100 MG capsule Take 2 capsules  (200 mg total) by mouth 3 (three) times daily as needed for cough. 30 capsule 0  . dexamethasone (DECADRON) 4 MG tablet Take 1 tablet (4 mg total) by mouth 3 (three) times daily. 90 tablet 0  . dextromethorphan-guaiFENesin (MUCINEX DM) 30-600 MG 12hr tablet Take 1 tablet by mouth 2 (two) times daily. 14 tablet 1  . disulfiram (ANTABUSE) 250 MG tablet Take 1 tablet (250 mg total) by mouth daily. 30 tablet 0  . Disulfiram 500 MG TABS Take 1 tablet (500 mg total) by mouth daily. 14 each 0  . fentaNYL (DURAGESIC - DOSED MCG/HR) 25 MCG/HR patch Place 1 patch (25 mcg total) onto the skin every 3 (three) days. 5 patch 0  . fluorouracil CALGB 29924 in sodium chloride 0.9 % 150 mL Inject into the vein. To start 06/05/16. To be given every 14 days. To infuse over 46 hours    . furosemide (LASIX) 20 MG tablet Take 20 mg by mouth daily.    Marland Kitchen leucovorin 50 MG injection Inject into the vein daily. To start 06/05/16. To be given every 14 days.    Marland Kitchen lidocaine-prilocaine (EMLA) cream Apply a quarter size amount to port site 1 hour prior to chemo. Do not rub in. Cover with plastic wrap. 30 g 3  . loperamide (IMODIUM A-D) 2 MG tablet Take 1 tablet (2 mg total) by mouth 4 (four) times daily as needed for diarrhea or loose stools. 30 tablet 0  . morphine (MSIR) 15 MG tablet Take 1 tablet (15 mg total) by mouth every 4 (four) hours as needed for severe pain. 60 tablet 0  . ondansetron (ZOFRAN) 8 MG tablet Take 1 tablet (8 mg total) by mouth every 8 (eight) hours as needed for nausea or vomiting. 30 tablet 2  . OXALIPLATIN IV Inject into the vein. To start 06/05/16. To be given every 14 days.    Marland Kitchen oxycodone (OXY-IR) 5 MG capsule Take 5 mg by mouth every 4 (four) hours as needed for pain.    . potassium chloride SA (K-DUR,KLOR-CON) 20 MEQ tablet Take 20 mEq by mouth daily.    . prochlorperazine (COMPAZINE) 10 MG tablet Take 1 tablet (10 mg total) by mouth every 6 (six) hours as needed for nausea or vomiting. 30 tablet 2  .  sildenafil (REVATIO) 20 MG tablet Take 2-5 tablets as needed for sexual activity 50 tablet 0   No current facility-administered medications for this visit.    Review of Systems  Constitutional: Positive for weight loss (10-15 lbs weight loss) and malaise/fatigue.       Loss of appetite.  HENT: Negative.  Negative for sore throat.   Eyes: Negative.   Respiratory: Negative.   Cardiovascular: Positive for leg swelling.  Gastrointestinal: Positive for vomiting and abdominal pain.       Abdominal swelling in the last couple of weeks. Severe liver pain.  Genitourinary: Negative.        Dark urine  Musculoskeletal: Negative.   Skin:  Negative.   Neurological: Positive for speech change and weakness.       Hoarse voice began a couple months ago.  Endo/Heme/Allergies: Negative.   Psychiatric/Behavioral: The patient has insomnia.        Trouble sleeping secondary to pain.  All other systems reviewed and are negative.  14 point ROS was done and is otherwise as detailed above or in HPI   PHYSICAL EXAMINATION: ECOG PERFORMANCE STATUS: 2 - Symptomatic, <50% confined to bed  Filed Vitals:   05/31/16 0854  BP: 129/71  Pulse: 125  Temp: 97.6 F (36.4 C)  Resp: 20   Filed Weights   05/31/16 0854  Weight: 182 lb (82.555 kg)   Physical Exam  Constitutional: He is oriented to person, place, and time.  Very frail. Cachectic. Hoarse voice.  HENT:  Head: Normocephalic and atraumatic.  Mouth/Throat: Oropharynx is clear and moist. No oropharyngeal exudate.  Eyes: Conjunctivae and EOM are normal. Pupils are equal, round, and reactive to light. Right eye exhibits no discharge. Left eye exhibits no discharge.  Neck: Normal range of motion. Neck supple. No tracheal deviation present. No thyromegaly present.  Palpable left submandibular node about 1.5 cm in size  Cardiovascular: Normal rate, regular rhythm and normal heart sounds.   No murmur heard. Pulmonary/Chest: Effort normal and breath  sounds normal. No respiratory distress. He has no wheezes. He has no rales. He exhibits no tenderness.  Abdominal: Soft. Bowel sounds are normal. He exhibits distension. There is tenderness.  Significant Hepatomegaly.   Musculoskeletal: Normal range of motion. He exhibits edema.  1 to 2+ pitting edema in bilateral lower extremities.  Lymphadenopathy:    He has cervical adenopathy.  Neurological: He is alert and oriented to person, place, and time. No cranial nerve deficit. Gait normal. Coordination normal.  Skin: Skin is warm and dry. No rash noted. He is not diaphoretic. No erythema.  Psychiatric: Mood, memory, affect and judgment normal.  Nursing note and vitals reviewed.    LABORATORY DATA:  I have reviewed the data as listed Lab Results  Component Value Date   WBC 7.9 05/31/2016   HGB 13.6 05/31/2016   HCT 40.7 05/31/2016   MCV 87.3 05/31/2016   PLT 192 05/31/2016   Results for CORNELIS, KLUVER (MRN [id number]) as of 06/05/2016 17:45  Ref. Range 05/31/2016 09:00  Sodium Latest Ref Range: 135-145 mmol/L 129 (L)  Potassium Latest Ref Range: 3.5-5.1 mmol/L 4.0  Chloride Latest Ref Range: 101-111 mmol/L 94 (L)  CO2 Latest Ref Range: 22-32 mmol/L 25  BUN Latest Ref Range: 6-20 mg/dL 11  Creatinine Latest Ref Range: 0.61-1.24 mg/dL 0.67  Calcium Latest Ref Range: 8.9-10.3 mg/dL 8.1 (L)  EGFR (Non-African Amer.) Latest Ref Range: >60 mL/min >60  EGFR (African American) Latest Ref Range: >60 mL/min >60  Glucose Latest Ref Range: 65-99 mg/dL 105 (H)  Anion gap Latest Ref Range: 5-15  10  Alkaline Phosphatase Latest Ref Range: 38-126 U/L 451 (H)  Albumin Latest Ref Range: 3.5-5.0 g/dL 2.3 (L)  AST Latest Ref Range: 15-41 U/L 371 (H)  ALT Latest Ref Range: 17-63 U/L 90 (H)  Total Protein Latest Ref Range: 6.5-8.1 g/dL 7.6  Total Bilirubin Latest Ref Range: 0.3-1.2 mg/dL 5.3 (H)   Results for DEVONTAYE, GROUND (MRN [id number]) as of 06/05/2016 17:45  Ref. Range 05/15/2016 12:37  LDH  Latest Ref Range: 98-192 U/L 1009 (H)     RADIOGRAPHIC STUDIES: I have personally reviewed the radiological images as listed and agreed with the findings in  the report. No results found. Study Result     CLINICAL DATA: Initial treatment strategy for pulmonary nodules and hepatic lesions.  EXAM: NUCLEAR MEDICINE PET SKULL BASE TO THIGH  TECHNIQUE: 9.1 mCi F-18 FDG was injected intravenously. Full-ring PET imaging was performed from the skull base to thigh after the radiotracer. CT data was obtained and used for attenuation correction and anatomic localization.  FASTING BLOOD GLUCOSE: Value: 89 mg/dl  COMPARISON: 05/15/2016  FINDINGS: NECK  No hypermetabolic lymph nodes in the neck.  CHEST  There is hypermetabolic activity in esophagus several cm above the gastroesophageal junction with SUV max equal 8.7. There is adjacent hypermetabolic paraesophageal lymph node with SUV max equal 8.9.  There bilateral ground-glass nodules which are hypermetabolic. For example RIGHT upper lobe nodule measuring 16 mm with SUV max equal 2.7 (image 71, series 4). Several additional solid nodules within the lungs which are also hypermetabolic. For example LEFT lower lobe nodule measuring 7 mm on image 79 with SUV max 2.5.  There is hypermetabolic RIGHT hilar lymph nodes with SUV max equal 6.7.  ABDOMEN/PELVIS  There is confluent abnormal hypermetabolic activity with underlying nodularity throughout the liver consistent with widespread hepatic metastasis. Individual lesions multi measure measure. One region with intense activity in the RIGHT hepatic lobe with SUV max equal 9.0. Lesion in the LEFT hepatic lobe with SUV max equal 8.8.  There is haziness within the abdominal mesentery. No hypermetabolic abdominal pelvic lymph nodes.  Hypermetabolic prevascular lymph node in the precordial space with SUV max equal 7.6  SKELETON  Focal activity in the spinous  process of the T1 vertebral body and inferior sternum with SUV max equal 4.4 concerning for skeletal metastasis  IMPRESSION: 1. Evidence of bilateral PULMONARY METASTASIS and widespread HEPATIC METASTASIS. 2. Focal metabolic activity in the distal esophagus is suggestive of PRIMARY ESOPHAGEAL CARCINOMA 3. Hypermetabolic lower paraesophageal metastatic lymph node. 4. Metastatic RIGHT hilar lymph node and precordial lymph node. 5. Evidence of skeletal metastasis in the spinous process of T1 and the inferior sternum.   Electronically Signed  By: Suzy Bouchard M.D.  On: 05/26/2016 15:01     PATHOLOGY   ASSESSMENT & PLAN:  Widespread Metastatic carcinoma of esophageal primary  Liver metastases Cancer cachexia Cancer related Pain Hoarseness Tobacco use Alcohol use   He will begin chemotherapy treatment today, Cycle #1 5-FU and oxaliplatin, he has been dose reduced. I discussed his case with Dr. Benay Spice who concurs with the plan. He was given 1 liter of fluids today and will return for fluids tomorrow, as needed. He will meet with nathan today.  End of life issues, living will, HCPOA, code status are all going to have to be addressed with the patient and will begin these discussions at follow-up next week.   I have written him a prescription for dexamethasone to manage liver capsular pain.  He will return in 1 week for follow up. He was advised to call with nausea, vomiting, worsening pain, diarrhea, fever.   ORDERS PLACED FOR THIS ENCOUNTER: Orders Placed This Encounter  Procedures  . CBC with Differential  . Comprehensive metabolic panel  . Ambulatory referral to Social Work   All questions were answered. The patient knows to call the clinic with any problems, questions or concerns.  This document serves as a record of services personally performed by Ancil Linsey, MD. It was created on her behalf by Arlyce Harman, a trained medical scribe. The creation of  this record is based on the scribe's personal  observations and the provider's statements to them. This document has been checked and approved by the attending provider.  I have reviewed the above documentation for accuracy and completeness, and I agree with the above.  This note was electronically signed.    Molli Hazard, MD  06/05/2016 5:47 PM

## 2016-05-31 NOTE — Progress Notes (Signed)
RD consulted for assessment on Oncology MD  Contacted Pt by Visiting at office appointment   Wt Readings from Last 10 Encounters:  05/31/16 182 lb (82.555 kg)  05/29/16 170 lb (77.111 kg)  05/19/16 167 lb (75.751 kg)  05/16/16 169 lb 12.8 oz (77.021 kg)  05/03/16 170 lb 4.8 oz (77.248 kg)  03/25/16 165 lb (74.844 kg)  02/09/16 165 lb (74.844 kg)  08/02/15 166 lb (75.297 kg)   Pt presents very ill appearing. He has very large, distended abdomen with BLE swelling. His weight measurement is not good indicator of status. Muscle/fat depletion is very visible in upper body and face.   Spoke with pt and his fiance.   Patient/Fiance reports oral intake as extremely poor and is suffering from symptoms including severe pain, alternating constipation and diarrhea, fatigue, nausea.  Per their report, pt largely lays in bed all day. He only drinks sports drinks (powerade) and eats some soup. His almost exclusively does very soft foods or liquids. He reports severe discomfort with PO intake-"it feels like I have to throw up". He also has no appetite. He has a BM every day, but notes it is watery/loose. His urine is very dark.   Given pt's disease severity, MD is proceeding with Chemo today. To be placed on 5FU and Oxaliplatin. RD gave Handouts for this drug is given and briefly discussed how he will have extreme sensitivity to cold foods. Should choose warm foods or foods at room temp.   RD went over best hydration beverages-skim milk (he should use whole), water, low sugar sports drinks.   It sounds like the main beverage patient is consuming is high sugar and not ideal.   RD gave examples of high kcal/protein foods that he should be able to tolerate: eggs, whole milk, cream soups, small pieces of meat. Recommend avoiding low kcal/pro food choices such as vegetables.  Unfortunately, given the pt/fiance report as well as MD report, pt's symptoms are extremely severe and related to his extent of  disease. MD reports only chemo will ameliorate these symptoms. As such, pt will likely have poor intake until his treatment takes affect.  Went over the [employer]. He was interested and accepting. Will order chocolate case of Ensure  Pt was extremely ill and did not communicate much. Most of information/history obtained from significant other   Left my contact info, coupons, and handouts titled "Increasing Calories and Protein" and  "Eloxatin (Oxaliplatin)"   Burtis Junes RD, LDN, [facility] Pager: [phone] 05/31/2016 10:25 AM

## 2016-05-31 NOTE — Patient Instructions (Addendum)
[place] at [facility] Discharge Instructions  RECOMMENDATIONS MADE BY THE CONSULTANT AND ANY TEST RESULTS WILL BE SENT TO YOUR REFERRING PHYSICIAN.  You will wear your pump for 46 hours. Your pump will be finished infusing around 12:50 on Friday. However we are bringing you in for IVF on Thursday and Friday to hydrate you.  We are calling in a steroid called Dexamethasone for your stomach pain. This will help decrease your pain.  Return tomorrow & Friday for IV fluids. We will remove your pump on Friday.  If you begin to feel sleepy, hallucinate - see things that aren't there, get really tired feeling, groggy ---- take it off and contact us.  Thank you for choosing [place] at [facility] to provide your oncology and hematology care.  To afford each patient quality time with our provider, please arrive at least 15 minutes before your scheduled appointment time.   Beginning January 23rd 2017 lab work for the [facility] will be done in the  Main lab at [facility] on 1st floor. If you have a lab appointment with the [place] please come in thru the  Main Entrance and check in at the main information desk  You need to re-schedule your appointment should you arrive 10 or more minutes late.  We strive to give you quality time with our providers, and arriving late affects you and other patients whose appointments are after yours.  Also, if you no show three or more times for appointments you may be dismissed from the clinic at the providers discretion.     Again, thank you for choosing [facility].  Our hope is that these requests will decrease the amount of time that you wait before being seen by our physicians.       _____________________________________________________________  Should you have questions after your visit to [facility], please contact our office at [phone] between the hours of 8:30 a.m.  and 4:30 p.m.  Voicemails left after 4:30 p.m. will not be returned until the following business day.  For prescription refill requests, have your pharmacy contact our office.         Resources For Cancer Patients and their Caregivers ? American Cancer Society: Can assist with transportation, wigs, general needs, runs Look Good Feel Better.        469-561-0911 ? Cancer Care: Provides financial assistance, online support groups, medication/co-pay assistance.  1-800-813-HOPE 646 249 6662) ? [place] Assists [place] cancer patients and their families through emotional , educational and financial support.  (628) 627-5072 ? [place] DSS Where to apply for food stamps, Medicaid and utility assistance. (908)419-4339 ? RCATS: Transportation to medical appointments. 512-145-5975 ? Social Security Administration: May apply for disability if have a Stage IV cancer. 367-014-8030 (787) 299-9736 ? [employer], Disability and Transit Services: Assists with nutrition, care and transit needs. [place] Support Programs: '@10RELATIVEDAYS'$ @ > Cancer Support Group  2nd Tuesday of the month 1pm-2pm, Journey Room  > Creative Journey  3rd Tuesday of the month 1130am-1pm, Journey Room  > Look Good Feel Better  1st Wednesday of the month 10am-12 noon, Journey Room (Call [place] to register [phone])

## 2016-05-31 NOTE — Patient Instructions (Signed)
[facility] Discharge Instructions for Patients Receiving Chemotherapy   Beginning January 23rd 2017 lab work for the [facility] will be done in the  Main lab at [facility] on 1st floor. If you have a lab appointment with the [facility] please come in thru the  Main Entrance and check in at the main information desk   Today you received the following chemotherapy agents:  Oxaliplatin, Leucovorin, and 5FU.  To help prevent nausea and vomiting after your treatment, we encourage you to take your nausea medication as prescribed.  If you develop nausea and vomiting, or diarrhea that is not controlled by your medication, call the clinic.  The clinic phone number is [phone]. Office hours are Monday-Friday 8:30am-5:00pm.  BELOW ARE SYMPTOMS THAT SHOULD BE REPORTED IMMEDIATELY:  *FEVER GREATER THAN 101.0 F  *CHILLS WITH OR WITHOUT FEVER  NAUSEA AND VOMITING THAT IS NOT CONTROLLED WITH YOUR NAUSEA MEDICATION  *UNUSUAL SHORTNESS OF BREATH  *UNUSUAL BRUISING OR BLEEDING  TENDERNESS IN MOUTH AND THROAT WITH OR WITHOUT PRESENCE OF ULCERS  *URINARY PROBLEMS  *BOWEL PROBLEMS  UNUSUAL RASH Items with * indicate a potential emergency and should be followed up as soon as possible. If you have an emergency after office hours please contact your primary care physician or go to the nearest emergency department.  Please call the clinic during office hours if you have any questions or concerns.   You may also contact the Patient Navigator at [phone] should you have any questions or need assistance in obtaining follow up care.      Resources For Cancer Patients and their Caregivers ? American Cancer Society: Can assist with transportation, wigs, general needs, runs Look Good Feel Better.        639-855-9032 ? Cancer Care: Provides financial assistance, online support groups, medication/co-pay assistance.  1-800-813-HOPE (503)091-9892) ? [place] Assists [place] cancer patients and their families through emotional , educational and financial support.  (432)461-2641 ? [place] DSS Where to apply for food stamps, Medicaid and utility assistance. 587 289 2802 ? RCATS: Transportation to medical appointments. (518) 362-7541 ? Social Security Administration: May apply for disability if have a Stage IV cancer. 405-389-6188 332-223-4731 ? [employer], Disability and Transit Services: Assists with nutrition, care and transit needs. 713-099-6305        Oxaliplatin Injection What is this medicine? OXALIPLATIN (ox AL i PLA tin) is a chemotherapy drug. It targets fast dividing cells, like cancer cells, and causes these cells to die. This medicine is used to treat cancers of the colon and rectum, and many other cancers. This medicine may be used for other purposes; ask your health care provider or pharmacist if you have questions. What should I tell my health care provider before I take this medicine? They need to know if you have any of these conditions: -kidney disease -an unusual or allergic reaction to oxaliplatin, other chemotherapy, other medicines, foods, dyes, or preservatives -pregnant or trying to get pregnant -breast-feeding How should I use this medicine? This drug is given as an infusion into a vein. It is administered in a hospital or clinic by a specially trained health care professional. Talk to your pediatrician regarding the use of this medicine in children. Special care may be needed. Overdosage: If you think you have taken too much of this medicine contact a poison control center or emergency room at once. NOTE: This medicine is only for you. Do not share this medicine with  others. What if I miss a dose? It is important not to miss a dose. Call your doctor or health care professional if you are unable to keep an appointment. What may interact with this medicine? -medicines to increase  blood counts like filgrastim, pegfilgrastim, sargramostim -probenecid -some antibiotics like amikacin, gentamicin, neomycin, polymyxin B, streptomycin, tobramycin -zalcitabine Talk to your doctor or health care professional before taking any of these medicines: -acetaminophen -aspirin -ibuprofen -ketoprofen -naproxen This list may not describe all possible interactions. Give your health care provider a list of all the medicines, herbs, non-prescription drugs, or dietary supplements you use. Also tell them if you smoke, drink alcohol, or use illegal drugs. Some items may interact with your medicine. What should I watch for while using this medicine? Your condition will be monitored carefully while you are receiving this medicine. You will need important blood work done while you are taking this medicine. This medicine can make you more sensitive to cold. Do not drink cold drinks or use ice. Cover exposed skin before coming in contact with cold temperatures or cold objects. When out in cold weather wear warm clothing and cover your mouth and nose to warm the air that goes into your lungs. Tell your doctor if you get sensitive to the cold. This drug may make you feel generally unwell. This is not uncommon, as chemotherapy can affect healthy cells as well as cancer cells. Report any side effects. Continue your course of treatment even though you feel ill unless your doctor tells you to stop. In some cases, you may be given additional medicines to help with side effects. Follow all directions for their use. Call your doctor or health care professional for advice if you get a fever, chills or sore throat, or other symptoms of a cold or flu. Do not treat yourself. This drug decreases your body's ability to fight infections. Try to avoid being around people who are sick. This medicine may increase your risk to bruise or bleed. Call your doctor or health care professional if you notice any unusual  bleeding. Be careful brushing and flossing your teeth or using a toothpick because you may get an infection or bleed more easily. If you have any dental work done, tell your dentist you are receiving this medicine. Avoid taking products that contain aspirin, acetaminophen, ibuprofen, naproxen, or ketoprofen unless instructed by your doctor. These medicines may hide a fever. Do not become pregnant while taking this medicine. Women should inform their doctor if they wish to become pregnant or think they might be pregnant. There is a potential for serious side effects to an unborn child. Talk to your health care professional or pharmacist for more information. Do not breast-feed an infant while taking this medicine. Call your doctor or health care professional if you get diarrhea. Do not treat yourself. What side effects may I notice from receiving this medicine? Side effects that you should report to your doctor or health care professional as soon as possible: -allergic reactions like skin rash, itching or hives, swelling of the face, lips, or tongue -low blood counts - This drug may decrease the number of white blood cells, red blood cells and platelets. You may be at increased risk for infections and bleeding. -signs of infection - fever or chills, cough, sore throat, pain or difficulty passing urine -signs of decreased platelets or bleeding - bruising, pinpoint red spots on the skin, black, tarry stools, nosebleeds -signs of decreased red blood cells - unusually weak or  tired, fainting spells, lightheadedness -breathing problems -chest pain, pressure -cough -diarrhea -jaw tightness -mouth sores -nausea and vomiting -pain, swelling, redness or irritation at the injection site -pain, tingling, numbness in the hands or feet -problems with balance, talking, walking -redness, blistering, peeling or loosening of the skin, including inside the mouth -trouble passing urine or change in the amount of  urine Side effects that usually do not require medical attention (report to your doctor or health care professional if they continue or are bothersome): -changes in vision -constipation -hair loss -loss of appetite -metallic taste in the mouth or changes in taste -stomach pain This list may not describe all possible side effects. Call your doctor for medical advice about side effects. You may report side effects to FDA at 1-800-FDA-1088. Where should I keep my medicine? This drug is given in a hospital or clinic and will not be stored at home. NOTE: This sheet is a summary. It may not cover all possible information. If you have questions about this medicine, talk to your doctor, pharmacist, or health care provider.    2016, Elsevier/Gold Standard. (2008-07-07 17:22:47) Leucovorin injection What is this medicine? LEUCOVORIN (loo koe VOR in) is used to prevent or treat the harmful effects of some medicines. This medicine is used to treat anemia caused by a low amount of folic acid in the body. It is also used with 5-fluorouracil (5-FU) to treat colon cancer. This medicine may be used for other purposes; ask your health care provider or pharmacist if you have questions. What should I tell my health care provider before I take this medicine? They need to know if you have any of these conditions: -anemia from low levels of vitamin B-12 in the blood -an unusual or allergic reaction to leucovorin, folic acid, other medicines, foods, dyes, or preservatives -pregnant or trying to get pregnant -breast-feeding How should I use this medicine? This medicine is for injection into a muscle or into a vein. It is given by a health care professional in a hospital or clinic setting. Talk to your pediatrician regarding the use of this medicine in children. Special care may be needed. Overdosage: If you think you have taken too much of this medicine contact a poison control center or emergency room at  once. NOTE: This medicine is only for you. Do not share this medicine with others. What if I miss a dose? This does not apply. What may interact with this medicine? -capecitabine -fluorouracil -phenobarbital -phenytoin -primidone -trimethoprim-sulfamethoxazole This list may not describe all possible interactions. Give your health care provider a list of all the medicines, herbs, non-prescription drugs, or dietary supplements you use. Also tell them if you smoke, drink alcohol, or use illegal drugs. Some items may interact with your medicine. What should I watch for while using this medicine? Your condition will be monitored carefully while you are receiving this medicine. This medicine may increase the side effects of 5-fluorouracil, 5-FU. Tell your doctor or health care professional if you have diarrhea or mouth sores that do not get better or that get worse. What side effects may I notice from receiving this medicine? Side effects that you should report to your doctor or health care professional as soon as possible: -allergic reactions like skin rash, itching or hives, swelling of the face, lips, or tongue -breathing problems -fever, infection -mouth sores -unusual bleeding or bruising -unusually weak or tired Side effects that usually do not require medical attention (report to your doctor or health care professional  if they continue or are bothersome): -constipation or diarrhea -loss of appetite -nausea, vomiting This list may not describe all possible side effects. Call your doctor for medical advice about side effects. You may report side effects to FDA at 1-800-FDA-1088. Where should I keep my medicine? This drug is given in a hospital or clinic and will not be stored at home. NOTE: This sheet is a summary. It may not cover all possible information. If you have questions about this medicine, talk to your doctor, pharmacist, or health care provider.    2016, Elsevier/Gold  Standard. (2008-06-16 16:50:29) Fluorouracil, 5-FU injection What is this medicine? FLUOROURACIL, 5-FU (flure oh YOOR a sil) is a chemotherapy drug. It slows the growth of cancer cells. This medicine is used to treat many types of cancer like breast cancer, colon or rectal cancer, pancreatic cancer, and stomach cancer. This medicine may be used for other purposes; ask your health care provider or pharmacist if you have questions. What should I tell my health care provider before I take this medicine? They need to know if you have any of these conditions: -blood disorders -dihydropyrimidine dehydrogenase (DPD) deficiency -infection (especially a virus infection such as chickenpox, cold sores, or herpes) -kidney disease -liver disease -malnourished, poor nutrition -recent or ongoing radiation therapy -an unusual or allergic reaction to fluorouracil, other chemotherapy, other medicines, foods, dyes, or preservatives -pregnant or trying to get pregnant -breast-feeding How should I use this medicine? This drug is given as an infusion or injection into a vein. It is administered in a hospital or clinic by a specially trained health care professional. Talk to your pediatrician regarding the use of this medicine in children. Special care may be needed. Overdosage: If you think you have taken too much of this medicine contact a poison control center or emergency room at once. NOTE: This medicine is only for you. Do not share this medicine with others. What if I miss a dose? It is important not to miss your dose. Call your doctor or health care professional if you are unable to keep an appointment. What may interact with this medicine? -allopurinol -cimetidine -dapsone -digoxin -hydroxyurea -leucovorin -levamisole -medicines for seizures like ethotoin, fosphenytoin, phenytoin -medicines to increase blood counts like filgrastim, pegfilgrastim, sargramostim -medicines that treat or prevent  blood clots like warfarin, enoxaparin, and dalteparin -methotrexate -metronidazole -pyrimethamine -some other chemotherapy drugs like busulfan, cisplatin, estramustine, vinblastine -trimethoprim -trimetrexate -vaccines Talk to your doctor or health care professional before taking any of these medicines: -acetaminophen -aspirin -ibuprofen -ketoprofen -naproxen This list may not describe all possible interactions. Give your health care provider a list of all the medicines, herbs, non-prescription drugs, or dietary supplements you use. Also tell them if you smoke, drink alcohol, or use illegal drugs. Some items may interact with your medicine. What should I watch for while using this medicine? Visit your doctor for checks on your progress. This drug may make you feel generally unwell. This is not uncommon, as chemotherapy can affect healthy cells as well as cancer cells. Report any side effects. Continue your course of treatment even though you feel ill unless your doctor tells you to stop. In some cases, you may be given additional medicines to help with side effects. Follow all directions for their use. Call your doctor or health care professional for advice if you get a fever, chills or sore throat, or other symptoms of a cold or flu. Do not treat yourself. This drug decreases your body's ability to fight infections.  Try to avoid being around people who are sick. This medicine may increase your risk to bruise or bleed. Call your doctor or health care professional if you notice any unusual bleeding. Be careful brushing and flossing your teeth or using a toothpick because you may get an infection or bleed more easily. If you have any dental work done, tell your dentist you are receiving this medicine. Avoid taking products that contain aspirin, acetaminophen, ibuprofen, naproxen, or ketoprofen unless instructed by your doctor. These medicines may hide a fever. Do not become pregnant while taking  this medicine. Women should inform their doctor if they wish to become pregnant or think they might be pregnant. There is a potential for serious side effects to an unborn child. Talk to your health care professional or pharmacist for more information. Do not breast-feed an infant while taking this medicine. Men should inform their doctor if they wish to father a child. This medicine may lower sperm counts. Do not treat diarrhea with over the counter products. Contact your doctor if you have diarrhea that lasts more than 2 days or if it is severe and watery. This medicine can make you more sensitive to the sun. Keep out of the sun. If you cannot avoid being in the sun, wear protective clothing and use sunscreen. Do not use sun lamps or tanning beds/booths. What side effects may I notice from receiving this medicine? Side effects that you should report to your doctor or health care professional as soon as possible: -allergic reactions like skin rash, itching or hives, swelling of the face, lips, or tongue -low blood counts - this medicine may decrease the number of white blood cells, red blood cells and platelets. You may be at increased risk for infections and bleeding. -signs of infection - fever or chills, cough, sore throat, pain or difficulty passing urine -signs of decreased platelets or bleeding - bruising, pinpoint red spots on the skin, black, tarry stools, blood in the urine -signs of decreased red blood cells - unusually weak or tired, fainting spells, lightheadedness -breathing problems -changes in vision -chest pain -mouth sores -nausea and vomiting -pain, swelling, redness at site where injected -pain, tingling, numbness in the hands or feet -redness, swelling, or sores on hands or feet -stomach pain -unusual bleeding Side effects that usually do not require medical attention (report to your doctor or health care professional if they continue or are bothersome): -changes in finger  or toe nails -diarrhea -dry or itchy skin -hair loss -headache -loss of appetite -sensitivity of eyes to the light -stomach upset -unusually teary eyes This list may not describe all possible side effects. Call your doctor for medical advice about side effects. You may report side effects to FDA at 1-800-FDA-1088. Where should I keep my medicine? This drug is given in a hospital or clinic and will not be stored at home. NOTE: This sheet is a summary. It may not cover all possible information. If you have questions about this medicine, talk to your doctor, pharmacist, or health care provider.    2016, Elsevier/Gold Standard. (2008-04-15 13:53:16)

## 2016-06-01 ENCOUNTER — Encounter (HOSPITAL_COMMUNITY): Payer: Self-pay

## 2016-06-01 ENCOUNTER — Inpatient Hospital Stay (HOSPITAL_COMMUNITY): Payer: 59

## 2016-06-01 ENCOUNTER — Encounter (HOSPITAL_BASED_OUTPATIENT_CLINIC_OR_DEPARTMENT_OTHER): Payer: 59

## 2016-06-01 VITALS — BP 114/78 | HR 80 | Temp 98.2°F | Resp 18

## 2016-06-01 DIAGNOSIS — Z5189 Encounter for other specified aftercare: Secondary | ICD-10-CM | POA: Diagnosis not present

## 2016-06-01 DIAGNOSIS — C155 Malignant neoplasm of lower third of esophagus: Secondary | ICD-10-CM

## 2016-06-01 DIAGNOSIS — C801 Malignant (primary) neoplasm, unspecified: Secondary | ICD-10-CM

## 2016-06-01 DIAGNOSIS — C787 Secondary malignant neoplasm of liver and intrahepatic bile duct: Secondary | ICD-10-CM

## 2016-06-01 MED ORDER — DEXAMETHASONE 4 MG PO TABS
4.0000 mg | ORAL_TABLET | Freq: Once | ORAL | Status: AC
  Administered 2016-06-01: 4 mg via ORAL
  Filled 2016-06-01: qty 1

## 2016-06-01 MED ORDER — SODIUM CHLORIDE 0.9 % IV SOLN
INTRAVENOUS | Status: DC
  Administered 2016-06-01: 09:00:00 via INTRAVENOUS

## 2016-06-01 MED ORDER — DEXAMETHASONE 4 MG PO TABS
4.0000 mg | ORAL_TABLET | Freq: Three times a day (TID) | ORAL | Status: DC
Start: 2016-06-01 — End: 2016-08-09

## 2016-06-01 NOTE — Patient Instructions (Signed)
[place] at [facility] Discharge Instructions  RECOMMENDATIONS MADE BY THE CONSULTANT AND ANY TEST RESULTS WILL BE SENT TO YOUR REFERRING PHYSICIAN.  IV fluids today (one liter of normal saline). Return tomorrow as scheduled for pump removal and port flush. Call the clinic should you have any questions or concerns.   Thank you for choosing [place] at [facility] to provide your oncology and hematology care.  To afford each patient quality time with our provider, please arrive at least 15 minutes before your scheduled appointment time.   Beginning January 23rd 2017 lab work for the [facility] will be done in the  Main lab at [facility] on 1st floor. If you have a lab appointment with the [place] please come in thru the  Main Entrance and check in at the main information desk  You need to re-schedule your appointment should you arrive 10 or more minutes late.  We strive to give you quality time with our providers, and arriving late affects you and other patients whose appointments are after yours.  Also, if you no show three or more times for appointments you may be dismissed from the clinic at the providers discretion.     Again, thank you for choosing [facility].  Our hope is that these requests will decrease the amount of time that you wait before being seen by our physicians.       _____________________________________________________________  Should you have questions after your visit to [facility], please contact our office at [phone] between the hours of 8:30 a.m. and 4:30 p.m.  Voicemails left after 4:30 p.m. will not be returned until the following business day.  For prescription refill requests, have your pharmacy contact our office.         Resources For Cancer Patients and their Caregivers ? American Cancer Society: Can assist with transportation, wigs, general needs, runs Look Good  Feel Better.        239-386-7799 ? Cancer Care: Provides financial assistance, online support groups, medication/co-pay assistance.  1-800-813-HOPE (601) 245-7079) ? [employer] Assists [place] cancer patients and their families through emotional , educational and financial support.  917-587-3510 ? [place] DSS Where to apply for food stamps, Medicaid and utility assistance. 773-650-4678 ? RCATS: Transportation to medical appointments. 9867386269 ? Social Security Administration: May apply for disability if have a Stage IV cancer. 941-039-6782 [phone] ? [employer], Disability and Transit Services: Assists with nutrition, care and transit needs. [place] Support Programs: '@10RELATIVEDAYS'$ @ > Cancer Support Group  2nd Tuesday of the month 1pm-2pm, Journey Room  > Creative Journey  3rd Tuesday of the month 1130am-1pm, Journey Room  > Look Good Feel Better  1st Wednesday of the month 10am-12 noon, Journey Room (Call [place] to register [phone])

## 2016-06-01 NOTE — Progress Notes (Signed)
1205:  Tolerated infusion w/o adverse reaction.  A&Ox4, in no distress. Discharged ambulatory in c/o family for transport home.

## 2016-06-02 ENCOUNTER — Encounter (HOSPITAL_COMMUNITY): Payer: 59

## 2016-06-02 ENCOUNTER — Encounter (HOSPITAL_BASED_OUTPATIENT_CLINIC_OR_DEPARTMENT_OTHER): Payer: 59

## 2016-06-02 VITALS — BP 114/67 | HR 82 | Temp 98.2°F | Resp 18

## 2016-06-02 DIAGNOSIS — C787 Secondary malignant neoplasm of liver and intrahepatic bile duct: Secondary | ICD-10-CM

## 2016-06-02 DIAGNOSIS — C159 Malignant neoplasm of esophagus, unspecified: Secondary | ICD-10-CM | POA: Diagnosis not present

## 2016-06-02 DIAGNOSIS — C155 Malignant neoplasm of lower third of esophagus: Secondary | ICD-10-CM

## 2016-06-02 MED ORDER — FENTANYL 25 MCG/HR TD PT72: 25.0000 ug | MEDICATED_PATCH | TRANSDERMAL | Status: DC

## 2016-06-02 MED ORDER — SODIUM CHLORIDE 0.9 % IV SOLN
INTRAVENOUS | Status: DC
  Administered 2016-06-02: 12:00:00 via INTRAVENOUS

## 2016-06-02 MED ORDER — HEPARIN SOD (PORK) LOCK FLUSH 100 UNIT/ML IV SOLN
500.0000 [IU] | Freq: Once | INTRAVENOUS | Status: AC | PRN
  Administered 2016-06-02: 500 [IU]

## 2016-06-02 MED ORDER — SODIUM CHLORIDE 0.9% FLUSH
10.0000 mL | INTRAVENOUS | Status: DC | PRN
  Administered 2016-06-02: 10 mL
  Filled 2016-06-02: qty 10

## 2016-06-02 NOTE — Progress Notes (Signed)
See infusion encounter.  

## 2016-06-02 NOTE — Patient Instructions (Signed)
[place] at [facility] Discharge Instructions  RECOMMENDATIONS MADE BY THE CONSULTANT AND ANY TEST RESULTS WILL BE SENT TO YOUR REFERRING PHYSICIAN.  IV fluids today (normal saline one liter). Pump disconnected and port flush today. Return as scheduled for office visit. Return as scheduled for chemotherapy. Call the clinic with any questions or concerns.   Thank you for choosing [place] at [facility] to provide your oncology and hematology care.  To afford each patient quality time with our provider, please arrive at least 15 minutes before your scheduled appointment time.   Beginning January 23rd 2017 lab work for the [facility] will be done in the  Main lab at [facility] on 1st floor. If you have a lab appointment with the [place] please come in thru the  Main Entrance and check in at the main information desk  You need to re-schedule your appointment should you arrive 10 or more minutes late.  We strive to give you quality time with our providers, and arriving late affects you and other patients whose appointments are after yours.  Also, if you no show three or more times for appointments you may be dismissed from the clinic at the providers discretion.     Again, thank you for choosing [facility].  Our hope is that these requests will decrease the amount of time that you wait before being seen by our physicians.       _____________________________________________________________  Should you have questions after your visit to [facility], please contact our office at [phone] between the hours of 8:30 a.m. and 4:30 p.m.  Voicemails left after 4:30 p.m. will not be returned until the following business day.  For prescription refill requests, have your pharmacy contact our office.         Resources For Cancer Patients and their Caregivers ? American Cancer Society: Can assist with  transportation, wigs, general needs, runs Look Good Feel Better.        986-161-6524 ? Cancer Care: Provides financial assistance, online support groups, medication/co-pay assistance.  1-800-813-HOPE 331-558-9884) ? [place] Assists [place] cancer patients and their families through emotional , educational and financial support.  5087544174 ? [place] DSS Where to apply for food stamps, Medicaid and utility assistance. 703-446-1522 ? RCATS: Transportation to medical appointments. 416-134-2116 ? Social Security Administration: May apply for disability if have a Stage IV cancer. 862-436-8486 2068185837 ? [employer], Disability and Transit Services: Assists with nutrition, care and transit needs. [place] Support Programs: '@10RELATIVEDAYS'$ @ > Cancer Support Group  2nd Tuesday of the month 1pm-2pm, Journey Room  > Creative Journey  3rd Tuesday of the month 1130am-1pm, Journey Room  > Look Good Feel Better  1st Wednesday of the month 10am-12 noon, Journey Room (Call [place] to register [phone])

## 2016-06-02 NOTE — Progress Notes (Signed)
Home infusion pump disconnected; NS 1L infusion initated at 333 ml/h.

## 2016-06-04 ENCOUNTER — Encounter (HOSPITAL_COMMUNITY): Payer: Self-pay | Admitting: Hematology & Oncology

## 2016-06-05 ENCOUNTER — Inpatient Hospital Stay (HOSPITAL_COMMUNITY): Payer: 59

## 2016-06-05 NOTE — Progress Notes (Signed)
24 hour follow up- pt states that he is feeling a little better since he has had chemotherapy.  He has a follow up appt in the morning

## 2016-06-06 ENCOUNTER — Encounter (HOSPITAL_BASED_OUTPATIENT_CLINIC_OR_DEPARTMENT_OTHER): Payer: 59 | Admitting: Oncology

## 2016-06-06 ENCOUNTER — Encounter (HOSPITAL_COMMUNITY): Payer: Self-pay | Admitting: Oncology

## 2016-06-06 ENCOUNTER — Encounter (HOSPITAL_COMMUNITY): Payer: 59

## 2016-06-06 VITALS — BP 115/67 | HR 78 | Temp 98.0°F | Wt 177.6 lb

## 2016-06-06 DIAGNOSIS — Z833 Family history of diabetes mellitus: Secondary | ICD-10-CM | POA: Diagnosis not present

## 2016-06-06 DIAGNOSIS — Z9889 Other specified postprocedural states: Secondary | ICD-10-CM | POA: Diagnosis not present

## 2016-06-06 DIAGNOSIS — I8500 Esophageal varices without bleeding: Secondary | ICD-10-CM

## 2016-06-06 DIAGNOSIS — C155 Malignant neoplasm of lower third of esophagus: Secondary | ICD-10-CM

## 2016-06-06 DIAGNOSIS — Z720 Tobacco use: Secondary | ICD-10-CM | POA: Diagnosis not present

## 2016-06-06 DIAGNOSIS — C787 Secondary malignant neoplasm of liver and intrahepatic bile duct: Secondary | ICD-10-CM | POA: Diagnosis not present

## 2016-06-06 DIAGNOSIS — Z809 Family history of malignant neoplasm, unspecified: Secondary | ICD-10-CM | POA: Diagnosis not present

## 2016-06-06 DIAGNOSIS — N529 Male erectile dysfunction, unspecified: Secondary | ICD-10-CM | POA: Diagnosis not present

## 2016-06-06 DIAGNOSIS — Z8719 Personal history of other diseases of the digestive system: Secondary | ICD-10-CM | POA: Diagnosis not present

## 2016-06-06 DIAGNOSIS — J42 Unspecified chronic bronchitis: Secondary | ICD-10-CM | POA: Diagnosis not present

## 2016-06-06 DIAGNOSIS — I10 Essential (primary) hypertension: Secondary | ICD-10-CM | POA: Diagnosis not present

## 2016-06-06 LAB — FERRITIN: FERRITIN: 428 ng/mL — AB (ref 24–336)

## 2016-06-06 LAB — CBC WITH DIFFERENTIAL/PLATELET
BASOS PCT: 0 %
Basophils Absolute: 0 10*3/uL (ref 0.0–0.1)
EOS ABS: 0 10*3/uL (ref 0.0–0.7)
Eosinophils Relative: 0 %
HEMATOCRIT: 43 % (ref 39.0–52.0)
Hemoglobin: 14.9 g/dL (ref 13.0–17.0)
LYMPHS ABS: 1.1 10*3/uL (ref 0.7–4.0)
Lymphocytes Relative: 13 %
MCH: 30.1 pg (ref 26.0–34.0)
MCHC: 34.7 g/dL (ref 30.0–36.0)
MCV: 86.9 fL (ref 78.0–100.0)
MONO ABS: 0.1 10*3/uL (ref 0.1–1.0)
MONOS PCT: 1 %
Neutro Abs: 7.2 10*3/uL (ref 1.7–7.7)
Neutrophils Relative %: 86 %
Platelets: 111 10*3/uL — ABNORMAL LOW (ref 150–400)
RBC: 4.95 MIL/uL (ref 4.22–5.81)
RDW: 17.1 % — AB (ref 11.5–15.5)
WBC: 8.4 10*3/uL (ref 4.0–10.5)

## 2016-06-06 LAB — COMPREHENSIVE METABOLIC PANEL
ALBUMIN: 2.1 g/dL — AB (ref 3.5–5.0)
ALT: 94 U/L — ABNORMAL HIGH (ref 17–63)
ANION GAP: 7 (ref 5–15)
AST: 187 U/L — ABNORMAL HIGH (ref 15–41)
Alkaline Phosphatase: 386 U/L — ABNORMAL HIGH (ref 38–126)
BILIRUBIN TOTAL: 3.7 mg/dL — AB (ref 0.3–1.2)
BUN: 13 mg/dL (ref 6–20)
CO2: 27 mmol/L (ref 22–32)
Calcium: 7.7 mg/dL — ABNORMAL LOW (ref 8.9–10.3)
Chloride: 93 mmol/L — ABNORMAL LOW (ref 101–111)
Creatinine, Ser: 0.53 mg/dL — ABNORMAL LOW (ref 0.61–1.24)
GLUCOSE: 119 mg/dL — AB (ref 65–99)
POTASSIUM: 4.4 mmol/L (ref 3.5–5.1)
Sodium: 127 mmol/L — ABNORMAL LOW (ref 135–145)
TOTAL PROTEIN: 6.9 g/dL (ref 6.5–8.1)

## 2016-06-06 NOTE — Assessment & Plan Note (Addendum)
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  Began palliative systemic chemotherapy on 05/31/2016 with [facility]X.  Oncology history updated.  Staging completed in [facility] problem list.  Labs today: CBC diff, CMET, ferritin.  Ferritin is admitted today due to EGD demonstrating esophageal varices and a decrease in hemoglobin although normal most recently.  Pre-treatment labs: CBC diff, CMET.  He tolerated his first cycle of chemotherapy well. He notes improvement in nausea and near resolution of vomiting. He reports that he is taking H2O by mouth without difficulty. He declines IV fluids this week. Pending on laboratory work, I may urge him to reconsider. He denies the need of any refills today.  His pain is much improved.  I personally reviewed and went over radiographic studies with the patient.  The results are noted within this dictation.  The patient and his fiance want to review the images together. We reviewed PET scan images and CT images in great detail.  His port site is well healed.  Steri-strips removed as they were barely in place.  He is advised that he may shower/bathe and gently clean site.    Code status and goals of care will need to be addressed in near future.  Return as scheduled for treatment and follow-up.

## 2016-06-06 NOTE — Patient Instructions (Signed)
[place] at [facility] Discharge Instructions  RECOMMENDATIONS MADE BY THE CONSULTANT AND ANY TEST RESULTS WILL BE SENT TO YOUR REFERRING PHYSICIAN.  Labs today: CBC Diff and CMET Chemo as scheduled next week Return as scheduled for follow up in 3 weeks Call with any issues  You may shower  Thank you for choosing [place] at [facility] to provide your oncology and hematology care.  To afford each patient quality time with our provider, please arrive at least 15 minutes before your scheduled appointment time.   Beginning January 23rd 2017 lab work for the [facility] will be done in the  Main lab at [facility] on 1st floor. If you have a lab appointment with the [place] please come in thru the  Main Entrance and check in at the main information desk  You need to re-schedule your appointment should you arrive 10 or more minutes late.  We strive to give you quality time with our providers, and arriving late affects you and other patients whose appointments are after yours.  Also, if you no show three or more times for appointments you may be dismissed from the clinic at the providers discretion.     Again, thank you for choosing [facility].  Our hope is that these requests will decrease the amount of time that you wait before being seen by our physicians.       _____________________________________________________________  Should you have questions after your visit to [facility], please contact our office at [phone] between the hours of 8:30 a.m. and 4:30 p.m.  Voicemails left after 4:30 p.m. will not be returned until the following business day.  For prescription refill requests, have your pharmacy contact our office.         Resources For Cancer Patients and their Caregivers ? American Cancer Society: Can assist with transportation, wigs, general needs, runs Look Good Feel Better.         682-413-4177 ? Cancer Care: Provides financial assistance, online support groups, medication/co-pay assistance.  1-800-813-HOPE 412-779-3788) ? [place] Assists [place] cancer patients and their families through emotional , educational and financial support.  [phone] ? [place] DSS Where to apply for food stamps, Medicaid and utility assistance. [phone] ? RCATS: Transportation to medical appointments. 951-282-4668 ? Social Security Administration: May apply for disability if have a Stage IV cancer. 7081075505 [phone] ? [employer], Disability and Transit Services: Assists with nutrition, care and transit needs. [place] Support Programs: '@10RELATIVEDAYS'$ @ > Cancer Support Group  2nd Tuesday of the month 1pm-2pm, Journey Room  > Creative Journey  3rd Tuesday of the month 1130am-1pm, Journey Room  > Look Good Feel Better  1st Wednesday of the month 10am-12 noon, Journey Room (Call [place] to register [phone])

## 2016-06-06 NOTE — Progress Notes (Signed)
Joshua Silversmith, NP [place] [address]  Malignant neoplasm of lower third of esophagus ([place]) - Plan: CBC with Differential, Comprehensive metabolic panel  Esophageal varices without bleeding, unspecified esophageal varices type ([place]) - Plan: Ferritin  CURRENT THERAPY: FOLFOX beginning on 05/31/2016  INTERVAL HISTORY: Joshua Greene 55 y.o. 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    Esophageal cancer ([place])   05/04/2016 Miscellaneous ED Visit secondary to bilateral ankle swelling   05/09/2016 Imaging Enlarged liver with innumerable hepatic hypodense lesions most c/w metastatic disease. Diffuse mesenteric edema as well as multiple enlarged mesenteric and retroperitoneal LN   05/15/2016 Imaging Two posterior RUL nodules, indeterminate, 17 mm short axis RL paraesophageal node worrisome for nodal metastasis. Site of primary remains indeterminate   05/16/2016 Initial Biopsy Ultrasound guided biopsy of R sided liver lesion   05/16/2016 Pathology Results Metastatic poorly differentiated carcinoma, immunoprofile not entirely specific. Favor poorly differentiated squamous cell carcinoma,    05/25/2016 Procedure EGD with Dr. Gala Romney with esophageal varices, area of punch out ulceration that appears to be neoplastic   05/25/2016 Pathology Results The neoplasm stained positive for CK5/6, p63 (squamous cell markers), MOC-31, CK7 (patchy). CDX-2 (weak), TTF-1 (non specific stain), and negative stains for CK20 and [place]. The immunostain pattern and morphologyfavored poorly diff sq cell carcinoma esoph   05/29/2016 Procedure Port-A-Cath placed by interventional radiology   05/31/2016 -  Chemotherapy FOLFOX   He tolerated his first cycle of chemotherapy well. He notes some fatigue but this is improved compared to prior to treatment. Improvement in nausea. He denies any vomiting. His nausea  predates chemotherapy and he reports a history of vomiting prior to chemotherapy as well. Both improved as mentioned.  He notes that he is able to drink. He feels as though he drinks enough water per day. He declines IV fluids this week.  His pain is much improved he reports.  His port was placed on 05/29/2016. He does have a Band-Aid and Steri-Strips in place. He asks when he can shower. I examined the site. I removed a Band-Aid and Steri-Strips that are barely in place. The site is completely healed. He may bathe today. He is asked to clean the site gently for approximately another week at which time he should be 100% healed.  He asked if I would review his imaging scans with him. We reviewed both the PET scan and CT imaging images together.  He and his fiance had a few questions about chemotherapy. I reviewed his chemotherapy plan in detail. He is educated that there is not an end date in mind and therefore he will continue with chemotherapy as long as it is working, he is tolerating it, and he wishes to pursue.  Review of Systems  Constitutional: Positive for malaise/fatigue. Negative for fever and chills.  HENT: Negative.   Eyes: Negative.   Respiratory: Negative.   Cardiovascular: Negative.   Gastrointestinal: Positive for nausea. Negative for vomiting.  Genitourinary: Negative.   Musculoskeletal: Negative.   Skin: Negative.   Neurological: Positive for weakness. Negative for focal weakness.  Endo/Heme/Allergies: Negative.   Psychiatric/Behavioral: Negative.     Past Medical History  Diagnosis Date  . Hypertension   . History of stomach ulcers   . Chicken pox   . Alcohol abuse   . Chronic bronchitis ([place])   . Erectile dysfunction   . Esophageal cancer ([place])  05/22/2016    Past Surgical History  Procedure Laterality Date  . Appendectomy  1978  . Hernia repair    . Esophagogastroduodenoscopy N/A 05/25/2016    Procedure: ESOPHAGOGASTRODUODENOSCOPY (EGD);  Surgeon: Daneil Dolin, MD;  Location: [facility];  Service: Endoscopy;  Laterality: N/A;    Family History  Problem Relation Age of Onset  . Hypertension Mother   . Diabetes Mother   . Cancer Neg Hx   . Heart disease Neg Hx   . Stroke Neg Hx     Social History   Social History  . Marital Status: Single    Spouse Name: N/A  . Number of Children: N/A  . Years of Education: N/A   Social History Main Topics  . Smoking status: Never Smoker   . Smokeless tobacco: Current User    Types: Chew     Comment: 5 years  . Alcohol Use: Yes     Comment: occ  . Drug Use: No  . Sexual Activity: Yes   Other Topics Concern  . None   Social History Narrative     PHYSICAL EXAMINATION  ECOG PERFORMANCE STATUS: 2 - Symptomatic, <50% confined to bed  Filed Vitals:   06/06/16 0816  BP: 115/67  Pulse: 78  Temp: 98 F (36.7 C)    GENERAL:alert, cachectic, smiling and tired/fatigued, large abdomen, accompanied by his fiancee.  SKIN: skin color, texture, turgor are normal, no rashes or significant lesions HEAD: Normocephalic EYES: Conjunctiva are pink and non-injected EARS: External ears normal OROPHARYNX:lips, buccal mucosa, and tongue normal and mucous membranes are moist  NECK: supple, trachea midline LYMPH:  not examined BREAST:not examined LUNGS: clear to auscultation and percussion HEART: regular rate & rhythm ABDOMEN:abdomen soft, obese, normal bowel sounds and hepatomegaly noted. BACK: Back symmetric, no curvature. EXTREMITIES:less then 2 second capillary refill, no skin discoloration, no cyanosis, positive findings:  edema B/L 2 + pitting edema in LE.  NEURO: alert & oriented x 3 with fluent speech, no focal motor/sensory deficits, gait normal   LABORATORY DATA: CBC    Component Value Date/Time   WBC 7.9 05/31/2016 0900   WBC 6.5 12/03/2014 1205   RBC 4.66 05/31/2016 0900   RBC 4.78 12/03/2014 1205   HGB 13.6 05/31/2016 0900   HGB 15.1 12/03/2014 1205   HCT 40.7 05/31/2016  0900   HCT 45.7 12/03/2014 1205   PLT 192 05/31/2016 0900   PLT 262 12/03/2014 1205   MCV 87.3 05/31/2016 0900   MCV 96 12/03/2014 1205   MCH 29.2 05/31/2016 0900   MCH 31.6 12/03/2014 1205   MCHC 33.4 05/31/2016 0900   MCHC 33.1 12/03/2014 1205   RDW 17.4* 05/31/2016 0900   RDW 13.3 12/03/2014 1205   LYMPHSABS 1.0 05/31/2016 0900   LYMPHSABS 1.0 01/14/2014 2000   MONOABS 1.2* 05/31/2016 0900   MONOABS 1.0 01/14/2014 2000   EOSABS 0.0 05/31/2016 0900   EOSABS 0.0 01/14/2014 2000   BASOSABS 0.0 05/31/2016 0900   BASOSABS 0.0 01/14/2014 2000      Chemistry      Component Value Date/Time   NA 129* 05/31/2016 0900   NA 137 12/03/2014 1205   K 4.0 05/31/2016 0900   K 3.8 12/03/2014 1205   CL 94* 05/31/2016 0900   CL 97* 12/03/2014 1205   CO2 25 05/31/2016 0900   CO2 30 12/03/2014 1205   BUN 11 05/31/2016 0900   BUN 8 12/03/2014 1205   CREATININE 0.67 05/31/2016 0900   CREATININE 0.69  12/03/2014 1205      Component Value Date/Time   CALCIUM 8.1* 05/31/2016 0900   CALCIUM 9.1 12/03/2014 1205   ALKPHOS 451* 05/31/2016 0900   ALKPHOS 71 12/03/2014 1205   AST 371* 05/31/2016 0900   AST 51* 12/03/2014 1205   ALT 90* 05/31/2016 0900   ALT 42 12/03/2014 1205   BILITOT 5.3* 05/31/2016 0900   BILITOT 0.9 12/03/2014 1205        PENDING LABS:   RADIOGRAPHIC STUDIES:  Ct Chest W Contrast  05/15/2016  CLINICAL DATA:  Staging, new hepatic lesions, upper abdominal pain x 2-3 months EXAM: CT CHEST WITH CONTRAST TECHNIQUE: Multidetector CT imaging of the chest was performed during intravenous contrast administration. CONTRAST:  32m ISOVUE-300 IOPAMIDOL (ISOVUE-300) INJECTION 61% COMPARISON:  CT abdomen dated 05/09/2016 FINDINGS: Mediastinum/Nodes: Heart is top-normal in size. No pericardial effusion. Coronary atherosclerosis in the LAD. No evidence of thoracic aortic aneurysm. 17 mm short axis right lower paraesophageal node (series 2/ image 96). Mildly prominent soft tissue in  the right perihilar region (series 2/image 79), poorly evaluated but possibly reflecting additional perihilar nodes. Otherwise, no suspicious mediastinal lymphadenopathy. Fluid/ debris in the distal esophagus. No definite primary esophageal malignancy is seen, although the distal esophagus is mildly thick-walled although underdistended. Lungs/Pleura: 5 mm nodule in the posterior right upper lobe (series 3/ image 49). Additional 3 mm posterior right upper lobe nodule (series 3/image 53). Mild patchy subpleural opacity in the superior segment right lower lobe (series 3/ image 59). Mild biapical pleural-parenchymal scarring. Mild eventration of the right hemidiaphragm with associated platelike atelectasis along the right middle lobe. Mild emphysematous changes with subpleural reticulation, upper lobe predominant. No focal consolidation. No pleural effusion or pneumothorax. Upper abdomen: Visualized upper abdomen is notable for multifocal hepatic lesions and heterogeneous perfusion, better evaluated on recent CT, suspicious for metastases. Musculoskeletal: Degenerative changes of the visualized thoracolumbar spine. IMPRESSION: Two posterior right upper lobe nodules measuring up to 5 mm, indeterminate. Pulmonary metastases are not excluded. 17 mm short axis right lower paraesophageal node, worrisome for nodal metastasis. Additional suspected right perihilar nodes. Suspected multifocal hepatic metastases, better evaluated on recent CT. Given the findings noted above, the site of primary malignancy remains indeterminate. The most prominent thoracic finding is the lower paraesophageal node, which may reflect nodal spread of abdominal malignancy. However, if the primary malignancy is in the chest, this would most likely be related to a nonvisualized lower esophageal tumor. Electronically Signed   By: SJulian HyM.D.   On: 05/15/2016 15:28   Ct Abdomen W Contrast  05/09/2016  CLINICAL DATA:  55year old male with  elevated lumen and ascites. Abdominal supplement and vomiting. EXAM: CT ABDOMEN WITH CONTRAST TECHNIQUE: Multidetector CT imaging of the abdomen was performed using the standard protocol following bolus administration of intravenous contrast. CONTRAST:  1029mISOVUE-300 IOPAMIDOL (ISOVUE-300) INJECTION 61% COMPARISON:  Abdominal ultrasound dated 01/15/2014 FINDINGS: The visualized lung bases are clear. No free air identified in the visualized abdomen. There is diffuse stranding and edema within the mesentery. Small perihepatic free fluid noted. The liver is enlarged and heterogeneous. The liver measures approximately 21 cm in the midclavicular length. There are innumerable hepatic hypodense masses throughout the liver replacing the majority of the liver parenchyma concerning for malignancy and most compatible with metastatic disease. Further evaluation with MRI without and with contrast recommended. There is nodularity of the hepatic contour. The gallbladder is predominantly contracted. There is a stone within the gallbladder. There is slight thickened appearance of the gallbladder  wall, likely related to underdistention. No significant pericholecystic fluid identified. Ultrasound may provide better evaluation of the gallbladder if clinically indicated. The pancreas appears unremarkable. There are multiple small hypodense lesions within the spleen. The adrenal glands appear unremarkable. There is a 1.1 cm exophytic hypodense lesion from the posterior cortex of the inferior pole of the right kidney which is incompletely characterized on this study but may represent a cyst. This can be characterized on the MRI performed to evaluate the liver. Set the visualized ureters appear unremarkable. There is mild compression of the stomach by the enlarged left lobe of the liver. Oral contrast noted within loops of small bowel. No bowel dilatation. The abdominal aorta and IVC appear unremarkable. The origins of the celiac axis,  SMA, IMA as well as the origins of the renal arteries are patent. The SMV, splenic vein, and the main portal vein are patent. No portal venous gas identified. There is diffuse stranding of the mesentery. Multiple mildly enlarged mesenteric lymph nodes noted. Mildly enlarged left periaortic lymph node measures approximately 1 cm in short axis. There is diffuse stranding of the subcutaneous soft tissues of the abdominal wall. There is a small fat containing umbilical hernia. There is degenerative changes of the spine. No acute fracture. IMPRESSION: Enlarged liver with innumerable hepatic hypodense lesions most compatible with metastatic disease. Further evaluation with MRI without and with contrast recommended. If there is no prior history of known malignancy further evaluation with PET-CT or tissue sampling recommended. Cholelithiasis. Multiple splenic hypodense lesions, incompletely characterized. Diffuse mesenteric edema as well as multiple enlarged mesenteric and retroperitoneal lymph nodes. Electronically Signed   By: Anner Crete M.D.   On: 05/09/2016 18:34   Nm Pet Image Initial (pi) Skull Base To Thigh  05/26/2016  CLINICAL DATA:  Initial treatment strategy for pulmonary nodules and hepatic lesions. EXAM: NUCLEAR MEDICINE PET SKULL BASE TO THIGH TECHNIQUE: 9.1 mCi F-18 FDG was injected intravenously. Full-ring PET imaging was performed from the skull base to thigh after the radiotracer. CT data was obtained and used for attenuation correction and anatomic localization. FASTING BLOOD GLUCOSE:  Value: 89 mg/dl COMPARISON:  05/15/2016 FINDINGS: NECK No hypermetabolic lymph nodes in the neck. CHEST There is hypermetabolic activity in esophagus several cm above the gastroesophageal junction with SUV max equal 8.7. There is adjacent hypermetabolic paraesophageal lymph node with SUV max equal 8.9. There bilateral ground-glass nodules which are hypermetabolic. For example RIGHT upper lobe nodule measuring 16 mm  with SUV max equal 2.7 (image 71, series 4). Several additional solid nodules within the lungs which are also hypermetabolic. For example LEFT lower lobe nodule measuring 7 mm on image 79 with SUV max 2.5. There is hypermetabolic RIGHT hilar lymph nodes with SUV max equal 6.7. ABDOMEN/PELVIS There is confluent abnormal hypermetabolic activity with underlying nodularity throughout the liver consistent with widespread hepatic metastasis. Individual lesions multi measure measure. One region with intense activity in the RIGHT hepatic lobe with SUV max equal 9.0. Lesion in the LEFT hepatic lobe with SUV max equal 8.8. There is haziness within the abdominal mesentery. No hypermetabolic abdominal pelvic lymph nodes. Hypermetabolic prevascular lymph node in the precordial space with SUV max equal 7.6 SKELETON Focal activity in the spinous process of the T1 vertebral body and inferior sternum with SUV max equal 4.4 concerning for skeletal metastasis IMPRESSION: 1. Evidence of bilateral PULMONARY METASTASIS and widespread HEPATIC METASTASIS. 2. Focal metabolic activity in the distal esophagus is suggestive of PRIMARY ESOPHAGEAL CARCINOMA 3. Hypermetabolic lower paraesophageal metastatic  lymph node. 4. Metastatic RIGHT hilar lymph node and precordial lymph node. 5. Evidence of skeletal metastasis in the spinous process of T1 and the inferior sternum. Electronically Signed   By: Suzy Bouchard M.D.   On: 05/26/2016 15:01   US Abdomen Limited  05/19/2016  CLINICAL DATA:  Abdominal distension question ascites, metastatic carcinoma to the liver EXAM: LIMITED ABDOMEN ULTRASOUND FOR ASCITES TECHNIQUE: Limited ultrasound survey for ascites was performed in all four abdominal quadrants. COMPARISON:  None FINDINGS: Small amount of ascites identified within pelvis. Minimal perihepatic fluid. No adequate pocket of ascites is identified for paracentesis. IMPRESSION: Insufficient ascites for paracentesis. Electronically Signed   By:  Lavonia Dana M.D.   On: 05/19/2016 16:39   US Biopsy  05/16/2016  INDICATION: 55 year old male with imaging diagnosis of liver metastases and no known primary. He has been referred for biopsy. EXAM: ULTRASOUND BIOPSY CORE LIVER MEDICATIONS: None. ANESTHESIA/SEDATION: Moderate (conscious) sedation was employed during this procedure. A total of Versed 1.5 mg and Fentanyl 50 mcg was administered intravenously. Moderate Sedation Time: 11 minutes. The patient's level of consciousness and vital signs were monitored continuously by radiology nursing throughout the procedure under my direct supervision. FLUOROSCOPY TIME:  None COMPLICATIONS: None PROCEDURE: The procedure, risks, benefits, and alternatives were explained to the patient. Questions regarding the procedure were encouraged and answered. The patient understands and consents to the procedure. Ultrasound survey was performed with images stored and sent to PACs. The right upper abdomen was prepped with chlorhexidine in a sterile fashion, and a sterile drape was applied covering the operative field. A sterile gown and sterile gloves were used for the procedure. Local anesthesia was provided with 1% Lidocaine. Once the patient is prepped and draped in usual sterile fashion and the skin and subcutaneous tissues were generously infiltrated with 1% lidocaine a small stab incision was made with an 11 blade scalpel. Using ultrasound guidance, 18 gauge 10 cm guide needle was advanced into the right liver lobe, targeting a mid right liver lobe hypodense lesion, 1 of many. Several 18 gauge core biopsy were achieved. Three Gel-Foam pledgets were then infused with a small amount of saline. Needle was withdrawn. Final image was stored after biopsy. Patient tolerated the procedure well and remained hemodynamically stable throughout. No complications were encountered and no significant blood loss was encounter IMPRESSION: Status post ultrasound-guided biopsy of right-sided liver  lesion with tissue specimen sent to pathology for complete histopathologic analysis. Signed, Dulcy Fanny. Earleen Newport, DO [facility] Electronically Signed   By: Corrie Mckusick D.O.   On: 05/16/2016 17:37   Ir Fluoro Guide Cv Line Right  05/29/2016  INDICATION: History of metastatic carcinoma of unknown primary. In need of durable intravenous access for chemotherapy administration. EXAM: IMPLANTED PORT A CATH PLACEMENT WITH ULTRASOUND AND FLUOROSCOPIC GUIDANCE COMPARISON:  None. MEDICATIONS: Ancef 2 gm IV; The antibiotic was administered within an appropriate time interval prior to skin puncture. ANESTHESIA/SEDATION: Moderate (conscious) sedation was employed during this procedure. A total of Versed 2 mg and Fentanyl 50 mcg was administered intravenously. Moderate Sedation Time: 24 minutes. The patient's level of consciousness and vital signs were monitored continuously by radiology nursing throughout the procedure under my direct supervision. CONTRAST:  None FLUOROSCOPY TIME:  18 seconds (4 mGy) COMPLICATIONS: None immediate. PROCEDURE: The procedure, risks, benefits, and alternatives were explained to the patient. Questions regarding the procedure were encouraged and answered. The patient understands and consents to the procedure. The right neck and chest were  prepped with chlorhexidine in a sterile fashion, and a sterile drape was applied covering the operative field. Maximum barrier sterile technique with sterile gowns and gloves were used for the procedure. A timeout was performed prior to the initiation of the procedure. Local anesthesia was provided with 1% lidocaine with epinephrine. After creating a small venotomy incision, a micropuncture kit was utilized to access the internal jugular vein under direct, real-time ultrasound guidance. Ultrasound image documentation was performed. The microwire was kinked to measure appropriate catheter length. A  subcutaneous port pocket was then created along the upper chest wall utilizing a combination of sharp and blunt dissection. The pocket was irrigated with sterile saline. A single lumen thin power injectable port was chosen for placement. The 8 Fr catheter was tunneled from the port pocket site to the venotomy incision. The port was placed in the pocket. The external catheter was trimmed to appropriate length. At the venotomy, an 8 Fr peel-away sheath was placed over a guidewire under fluoroscopic guidance. The catheter was then placed through the sheath and the sheath was removed. Final catheter positioning was confirmed and documented with a fluoroscopic spot radiograph. The port was accessed with a Huber needle, aspirated and flushed with heparinized saline. The venotomy site was closed with an interrupted 4-0 Vicryl suture. The port pocket incision was closed with interrupted 2-0 Vicryl suture and the skin was opposed with a running subcuticular 4-0 Vicryl suture. Dermabond and Steri-strips were applied to both incisions. Dressings were placed. The patient tolerated the procedure well without immediate post procedural complication. FINDINGS: After catheter placement, the tip lies within the superior cavoatrial junction. The catheter aspirates and flushes normally and is ready for immediate use. IMPRESSION: Successful placement of a right internal jugular approach power injectable Port-A-Cath. The catheter is ready for immediate use. Electronically Signed   By: Sandi Mariscal M.D.   On: 05/29/2016 16:17   Ir US Guide Vasc Access Right  05/29/2016  INDICATION: History of metastatic carcinoma of unknown primary. In need of durable intravenous access for chemotherapy administration. EXAM: IMPLANTED PORT A CATH PLACEMENT WITH ULTRASOUND AND FLUOROSCOPIC GUIDANCE COMPARISON:  None. MEDICATIONS: Ancef 2 gm IV; The antibiotic was administered within an appropriate time interval prior to skin puncture. ANESTHESIA/SEDATION:  Moderate (conscious) sedation was employed during this procedure. A total of Versed 2 mg and Fentanyl 50 mcg was administered intravenously. Moderate Sedation Time: 24 minutes. The patient's level of consciousness and vital signs were monitored continuously by radiology nursing throughout the procedure under my direct supervision. CONTRAST:  None FLUOROSCOPY TIME:  18 seconds (4 mGy) COMPLICATIONS: None immediate. PROCEDURE: The procedure, risks, benefits, and alternatives were explained to the patient. Questions regarding the procedure were encouraged and answered. The patient understands and consents to the procedure. The right neck and chest were prepped with chlorhexidine in a sterile fashion, and a sterile drape was applied covering the operative field. Maximum barrier sterile technique with sterile gowns and gloves were used for the procedure. A timeout was performed prior to the initiation of the procedure. Local anesthesia was provided with 1% lidocaine with epinephrine. After creating a small venotomy incision, a micropuncture kit was utilized to access the internal jugular vein under direct, real-time ultrasound guidance. Ultrasound image documentation was performed. The microwire was kinked to measure appropriate catheter length. A subcutaneous port pocket was then created along the upper chest wall utilizing a combination of sharp and blunt dissection. The pocket was irrigated with sterile saline. A single lumen thin power injectable  port was chosen for placement. The 8 Fr catheter was tunneled from the port pocket site to the venotomy incision. The port was placed in the pocket. The external catheter was trimmed to appropriate length. At the venotomy, an 8 Fr peel-away sheath was placed over a guidewire under fluoroscopic guidance. The catheter was then placed through the sheath and the sheath was removed. Final catheter positioning was confirmed and documented with a fluoroscopic spot radiograph. The  port was accessed with a Huber needle, aspirated and flushed with heparinized saline. The venotomy site was closed with an interrupted 4-0 Vicryl suture. The port pocket incision was closed with interrupted 2-0 Vicryl suture and the skin was opposed with a running subcuticular 4-0 Vicryl suture. Dermabond and Steri-strips were applied to both incisions. Dressings were placed. The patient tolerated the procedure well without immediate post procedural complication. FINDINGS: After catheter placement, the tip lies within the superior cavoatrial junction. The catheter aspirates and flushes normally and is ready for immediate use. IMPRESSION: Successful placement of a right internal jugular approach power injectable Port-A-Cath. The catheter is ready for immediate use. Electronically Signed   By: Sandi Mariscal M.D.   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  Began palliative systemic chemotherapy on 05/31/2016 with FOLFOX.  Oncology history updated.  Staging completed in [facility] problem list.  Labs today: CBC diff, CMET, ferritin.  Ferritin is admitted today due to EGD demonstrating esophageal varices and a decrease in hemoglobin although normal most recently.  Pre-treatment labs: CBC diff, CMET.  He tolerated his first cycle of chemotherapy well. He notes improvement in nausea and near resolution of vomiting. He reports that he is taking H2O by mouth without difficulty. He declines IV fluids this week. Pending on laboratory work, I may urge him to reconsider. He denies the need of any refills today.  His pain is much improved.  I personally reviewed and went over radiographic studies with the patient.  The results are noted within this dictation.  The patient and his fiance want to review the images together. We reviewed PET scan images and CT images in  great detail.  His port site is well healed.  Steri-strips removed as they were barely in place.  He is advised that he may shower/bathe and gently clean site.    Code status and goals of care will need to be addressed in near future.  Return as scheduled for treatment and follow-up.    ORDERS PLACED FOR THIS ENCOUNTER: Orders Placed This Encounter  Procedures  . CBC with Differential  . Comprehensive metabolic panel  . Ferritin    MEDICATIONS PRESCRIBED THIS ENCOUNTER: No orders of the defined types were placed in this encounter.    THERAPY PLAN:  Continue treatment as planned with supportive care.  All questions were answered. The patient knows to call the clinic with any problems, questions or concerns. We can certainly see the patient much sooner if necessary.  Patient and plan discussed with Dr. Ancil Linsey and she is in agreement with the aforementioned.   This note is electronically signed by: Doy Mince 06/06/2016 9:45 AM

## 2016-06-07 ENCOUNTER — Encounter (HOSPITAL_COMMUNITY): Payer: 59

## 2016-06-12 ENCOUNTER — Inpatient Hospital Stay (HOSPITAL_COMMUNITY): Payer: 59

## 2016-06-13 ENCOUNTER — Encounter: Payer: Self-pay | Admitting: *Deleted

## 2016-06-13 ENCOUNTER — Ambulatory Visit (HOSPITAL_COMMUNITY): Payer: 59 | Admitting: Oncology

## 2016-06-13 NOTE — Progress Notes (Signed)
[facility] Psychosocial Distress Screening Clinical Social Work  Clinical Social Work was referred by distress screening protocol.  The patient scored a 8 on the Psychosocial Distress Thermometer which indicates severe distress. Clinical Social Worker phoned pt at home to assess for distress and other psychosocial needs. Pt was somewhat hard to understand over the phone and appeared to be slurring his words at times. Pt had a lot of questions regarding his cancer and asked "what stage cancer do I have". CSW suggested pt make a list of questions for the medical team and ask them when he comes to his visit tomorrow. Pt stated understanding. He reports he has applied for ST disability through work. CSW explained he may want to consider applying for social security disability once he finds out his stage of cancer and if he will not be able to return to work. Pt reports to live with his fiance and only recently moved to the [place] area. He reports most of his family and his mother live in the [place] area and are supportive.   CSW explained possible need to complete HCPOA paperwork as he and his significant other are not married and based on [place] law those decisions default to next of kin or spouse unless formal paperwork is in place. Pt stated understanding and CSW explained how he could complete paperwork on a Tuesday when CSW at [facility]. Pt had several questions about MCD and thought he had applied. CSW will refer to financial counselor for follow up. CSW inquired about drinking and pt stated he has only "drank a few times in the last 6 months". 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 CSW stressed the importance of following medical team's suggestions of not drinking and offered assistance with this as needed. Pt denied needing help with not drinking alcohol. CSW reviewed additional resources in community, provided CSW contact and will mail him a list of local resources. Pt appreciated contact and agrees to  reach out as needed.   ONCBCN DISTRESS SCREENING 05/31/2016  Screening Type Initial Screening  Distress experienced in past week (1-10) 8  Practical problem type Insurance  Emotional problem type Nervousness/Anxiety;Adjusting to illness  Spiritual/Religous concerns type Relating to God  Physical Problem type Pain;Nausea/vomiting;Sleep/insomnia;Breathing;Mouth sores/swallowing;Loss of appetitie;Changes in urination;Skin dry/itchy  Physician notified of physical symptoms Yes  Referral to clinical social work Yes  Referral to financial advocate Yes    Clinical Social Worker follow up needed: Yes.    If yes, follow up plan: See above Loren Racer, [place] Worker [place]  [facility] Phone: [phone] Fax: [phone]

## 2016-06-14 ENCOUNTER — Encounter (HOSPITAL_BASED_OUTPATIENT_CLINIC_OR_DEPARTMENT_OTHER): Payer: 59

## 2016-06-14 ENCOUNTER — Encounter: Payer: Self-pay | Admitting: Dietician

## 2016-06-14 ENCOUNTER — Encounter (HOSPITAL_COMMUNITY): Payer: 59

## 2016-06-14 VITALS — BP 139/82 | HR 94 | Temp 98.0°F | Resp 18 | Wt 168.8 lb

## 2016-06-14 DIAGNOSIS — C159 Malignant neoplasm of esophagus, unspecified: Secondary | ICD-10-CM | POA: Diagnosis not present

## 2016-06-14 DIAGNOSIS — Z720 Tobacco use: Secondary | ICD-10-CM | POA: Diagnosis not present

## 2016-06-14 DIAGNOSIS — Z9889 Other specified postprocedural states: Secondary | ICD-10-CM | POA: Diagnosis not present

## 2016-06-14 DIAGNOSIS — C787 Secondary malignant neoplasm of liver and intrahepatic bile duct: Secondary | ICD-10-CM

## 2016-06-14 DIAGNOSIS — Z5111 Encounter for antineoplastic chemotherapy: Secondary | ICD-10-CM | POA: Diagnosis not present

## 2016-06-14 DIAGNOSIS — J42 Unspecified chronic bronchitis: Secondary | ICD-10-CM | POA: Diagnosis not present

## 2016-06-14 DIAGNOSIS — C155 Malignant neoplasm of lower third of esophagus: Secondary | ICD-10-CM

## 2016-06-14 DIAGNOSIS — Z833 Family history of diabetes mellitus: Secondary | ICD-10-CM | POA: Diagnosis not present

## 2016-06-14 DIAGNOSIS — Z809 Family history of malignant neoplasm, unspecified: Secondary | ICD-10-CM | POA: Diagnosis not present

## 2016-06-14 DIAGNOSIS — Z8719 Personal history of other diseases of the digestive system: Secondary | ICD-10-CM | POA: Diagnosis not present

## 2016-06-14 DIAGNOSIS — I10 Essential (primary) hypertension: Secondary | ICD-10-CM | POA: Diagnosis not present

## 2016-06-14 DIAGNOSIS — N529 Male erectile dysfunction, unspecified: Secondary | ICD-10-CM | POA: Diagnosis not present

## 2016-06-14 LAB — CBC WITH DIFFERENTIAL/PLATELET
Basophils Absolute: 0 10*3/uL (ref 0.0–0.1)
Basophils Relative: 0 %
EOS ABS: 0 10*3/uL (ref 0.0–0.7)
Eosinophils Relative: 0 %
HEMATOCRIT: 39.7 % (ref 39.0–52.0)
HEMOGLOBIN: 13.6 g/dL (ref 13.0–17.0)
LYMPHS ABS: 0.9 10*3/uL (ref 0.7–4.0)
Lymphocytes Relative: 26 %
MCH: 29.9 pg (ref 26.0–34.0)
MCHC: 34.3 g/dL (ref 30.0–36.0)
MCV: 87.3 fL (ref 78.0–100.0)
MONOS PCT: 7 %
Monocytes Absolute: 0.2 10*3/uL (ref 0.1–1.0)
NEUTROS PCT: 68 %
Neutro Abs: 2.5 10*3/uL (ref 1.7–7.7)
Platelets: 180 10*3/uL (ref 150–400)
RBC: 4.55 MIL/uL (ref 4.22–5.81)
RDW: 18.8 % — ABNORMAL HIGH (ref 11.5–15.5)
WBC: 3.7 10*3/uL — ABNORMAL LOW (ref 4.0–10.5)

## 2016-06-14 LAB — COMPREHENSIVE METABOLIC PANEL
ALK PHOS: 344 U/L — AB (ref 38–126)
ALT: 122 U/L — ABNORMAL HIGH (ref 17–63)
ANION GAP: 5 (ref 5–15)
AST: 127 U/L — ABNORMAL HIGH (ref 15–41)
Albumin: 2.2 g/dL — ABNORMAL LOW (ref 3.5–5.0)
BILIRUBIN TOTAL: 3.1 mg/dL — AB (ref 0.3–1.2)
BUN: 14 mg/dL (ref 6–20)
CALCIUM: 7.8 mg/dL — AB (ref 8.9–10.3)
CO2: 27 mmol/L (ref 22–32)
Chloride: 97 mmol/L — ABNORMAL LOW (ref 101–111)
Creatinine, Ser: 0.42 mg/dL — ABNORMAL LOW (ref 0.61–1.24)
GFR calc non Af Amer: >60 mL/min (ref >60)
Glucose, Bld: 170 mg/dL — ABNORMAL HIGH (ref 65–99)
Potassium: 3.9 mmol/L (ref 3.5–5.1)
Sodium: 129 mmol/L — ABNORMAL LOW (ref 135–145)
TOTAL PROTEIN: 6.4 g/dL — AB (ref 6.5–8.1)

## 2016-06-14 MED ORDER — DEXTROSE 5 % IV SOLN
65.0000 mg/m2 | Freq: Once | INTRAVENOUS | Status: AC
  Administered 2016-06-14: 130 mg via INTRAVENOUS
  Filled 2016-06-14: qty 26

## 2016-06-14 MED ORDER — DEXAMETHASONE SODIUM PHOSPHATE 100 MG/10ML IJ SOLN
20.0000 mg | Freq: Once | INTRAMUSCULAR | Status: AC
  Administered 2016-06-14: 20 mg via INTRAVENOUS
  Filled 2016-06-14: qty 2

## 2016-06-14 MED ORDER — DEXTROSE 5 % IV SOLN
Freq: Once | INTRAVENOUS | Status: AC
Start: 2016-06-14 — End: 2016-06-14
  Administered 2016-06-14: 11:00:00 via INTRAVENOUS

## 2016-06-14 MED ORDER — PALONOSETRON HCL INJECTION 0.25 MG/5ML
0.2500 mg | Freq: Once | INTRAVENOUS | Status: AC
  Administered 2016-06-14: 0.25 mg via INTRAVENOUS
  Filled 2016-06-14: qty 5

## 2016-06-14 MED ORDER — SODIUM CHLORIDE 0.9 % IV SOLN
1680.0000 mg/m2 | INTRAVENOUS | Status: DC
  Administered 2016-06-14: 3400 mg via INTRAVENOUS
  Filled 2016-06-14: qty 68

## 2016-06-14 MED ORDER — SODIUM CHLORIDE 0.9% FLUSH: 10.0000 mL | INTRAVENOUS | Status: DC | PRN

## 2016-06-14 MED ORDER — LEUCOVORIN CALCIUM INJECTION 350 MG
400.0000 mg/m2 | Freq: Once | INTRAMUSCULAR | Status: AC
  Administered 2016-06-14: 804 mg via INTRAVENOUS
  Filled 2016-06-14: qty 40.2

## 2016-06-14 NOTE — Progress Notes (Signed)
Follow up with patient  Contacted Joshua Greene by visiting during chemo  Wt Readings from Last 10 Encounters:  06/14/16 168 lb 12.8 oz (76.567 kg)  06/06/16 177 lb 9.6 oz (80.559 kg)  05/31/16 182 lb (82.555 kg)  05/29/16 170 lb (77.111 kg)  05/19/16 167 lb (75.751 kg)  05/16/16 169 lb 12.8 oz (77.021 kg)  05/03/16 170 lb 4.8 oz (77.248 kg)  03/25/16 165 lb (74.844 kg)  02/09/16 165 lb (74.844 kg)  08/02/15 166 lb (75.297 kg)   Patient weight has Decreased by 14 lbs since he was seen 2 weeks ago. However, he says he has noticed a decrease in his swelling which may make up a portion of his loss.   Patient reports oral intake as minimal and is still suffering from symptoms including poor appetite, pain with intake, constipation, vomiting.   He says he has stopped drinking alcohol and has not had any recently.   Joshua Greene unfortunately is eating very little. His first meal consists of just applesauce. From there on he will just "snack on and off" on items such as pudding, icecream, fruit cups. He drinks water, gatorade and juice. With what he reported, RD roughly estimated he likely is consuming <1000 kcals/day.   His main problem is that he gets abdominal pains when he eats more then just a small amount. He notes these pains are sometimes worse than they were initially.  In addition, he reports episodes of vomiting, though these are sudden episodes and he does not stay nauseated. He is constipated. Likely from poor hydration and pain medications.   Discussed with Joshua Greene that because he is only able to eat very little without pain, he needs to make the most of his intake. Rd to order him a case of Chocolate Ensure. He has tried some regular boost/ensure denied this causing any more pain then a clear supplement.   He has a myriad of non-nutrition related issues:   He notes trouble sleeping- "I slept awful last night". He also has trouble sleeping during the day due to his learned habits.    He stays  fatigued. He demonstrates anxiety about his current level of functioning. He says it is extremely difficult for him to stay in bed as he was an active person. "I cant stop thinking about my job". He says he "doesnt like talking" and is more action oriented.    He has a growth/callous on his foot that is painful and is hoping someone can remove this.    He had questions about his Port bandage and if he can keep this on. Explained the clear wrap is just while it is accessed and the needle is in place. Explained that the the MD/PA assessed his port last week and says it is ok to bathe "oh, yes that is right".   -Sleep, pain, bowel management were told to be brought up next MD appointment  -Painful growth was told to PA who will have an RN instruct him on how to get this removed  -Tried to settle his anxiety with his work. Asked him to try to be comfortable "being the patient" . Reccommended finding a hobby to keep his mind off his lack of activity.   Hopefully, further chemo will help to further diminish his cancer related symptoms and subsequently improve PO intake   RD to order Ensure. Will continue to follow.   Burtis Junes RD, LDN, [place] [facility] Pager: [phone] 06/14/2016 12:39 PM

## 2016-06-14 NOTE — Progress Notes (Signed)
Patient tolerated infusion well.  VSS.  Patient given case of ensure from pharmacy.

## 2016-06-14 NOTE — Patient Instructions (Signed)
[facility] Discharge Instructions for Patients Receiving Chemotherapy   Beginning January 23rd 2017 lab work for the [facility] will be done in the  Main lab at [facility] on 1st floor. If you have a lab appointment with the [place] please come in thru the  Main Entrance and check in at the main information desk   Today you received the following chemotherapy agents: oxaliplatin, leucovorin, and fluorouracil.     If you develop nausea and vomiting, or diarrhea that is not controlled by your medication, call the clinic.  The clinic phone number is [phone]. Office hours are Monday-Friday 8:30am-5:00pm.  BELOW ARE SYMPTOMS THAT SHOULD BE REPORTED IMMEDIATELY:  *FEVER GREATER THAN 101.0 F  *CHILLS WITH OR WITHOUT FEVER  NAUSEA AND VOMITING THAT IS NOT CONTROLLED WITH YOUR NAUSEA MEDICATION  *UNUSUAL SHORTNESS OF BREATH  *UNUSUAL BRUISING OR BLEEDING  TENDERNESS IN MOUTH AND THROAT WITH OR WITHOUT PRESENCE OF ULCERS  *URINARY PROBLEMS  *BOWEL PROBLEMS  UNUSUAL RASH Items with * indicate a potential emergency and should be followed up as soon as possible. If you have an emergency after office hours please contact your primary care physician or go to the nearest emergency department.  Please call the clinic during office hours if you have any questions or concerns.   You may also contact the Patient Navigator at [phone] should you have any questions or need assistance in obtaining follow up care.      Resources For Cancer Patients and their Caregivers ? American Cancer Society: Can assist with transportation, wigs, general needs, runs Look Good Feel Better.        (228)185-9722 ? Cancer Care: Provides financial assistance, online support groups, medication/co-pay assistance.  1-800-813-HOPE 570-453-1746) ? [employer] Assists [place] cancer patients and their families through emotional , educational and  financial support.  (510)773-8645 ? [place] DSS Where to apply for food stamps, Medicaid and utility assistance. 226-310-1305 ? RCATS: Transportation to medical appointments. 202 327 1017 ? Social Security Administration: May apply for disability if have a Stage IV cancer. 8634517430 (336)032-6933 ? [employer], Disability and Transit Services: Assists with nutrition, care and transit needs. 717-229-2388

## 2016-06-16 ENCOUNTER — Encounter (HOSPITAL_COMMUNITY): Payer: Self-pay

## 2016-06-16 ENCOUNTER — Encounter (HOSPITAL_BASED_OUTPATIENT_CLINIC_OR_DEPARTMENT_OTHER): Payer: 59

## 2016-06-16 VITALS — BP 115/73 | HR 106 | Temp 98.0°F | Resp 18

## 2016-06-16 DIAGNOSIS — C159 Malignant neoplasm of esophagus, unspecified: Secondary | ICD-10-CM

## 2016-06-16 DIAGNOSIS — C787 Secondary malignant neoplasm of liver and intrahepatic bile duct: Secondary | ICD-10-CM

## 2016-06-16 DIAGNOSIS — C155 Malignant neoplasm of lower third of esophagus: Secondary | ICD-10-CM

## 2016-06-16 DIAGNOSIS — Z452 Encounter for adjustment and management of vascular access device: Secondary | ICD-10-CM

## 2016-06-16 MED ORDER — HEPARIN SOD (PORK) LOCK FLUSH 100 UNIT/ML IV SOLN
500.0000 [IU] | Freq: Once | INTRAVENOUS | Status: AC | PRN
  Administered 2016-06-16: 500 [IU]

## 2016-06-16 MED ORDER — SODIUM CHLORIDE 0.9% FLUSH
10.0000 mL | INTRAVENOUS | Status: DC | PRN
  Administered 2016-06-16: 10 mL
  Filled 2016-06-16: qty 10

## 2016-06-16 NOTE — Progress Notes (Signed)
Joshua Greene presents to have home infusion pump d/c'd and for port-a-cath deaccess with flush. Portacath located right chest wall accessed with  H 20 needle.  Good blood return present. Portacath flushed with NS and 500U/62m Heparin, and needle removed intact.  Procedure tolerated well and without incident.

## 2016-06-16 NOTE — Patient Instructions (Signed)
[place] at [facility] Discharge Instructions  RECOMMENDATIONS MADE BY THE CONSULTANT AND ANY TEST RESULTS WILL BE SENT TO YOUR REFERRING PHYSICIAN.  Pump removal and port flush today. Return as scheduled for chemotherapy and office visit. Call the clinic should you have any questions or concerns prior to your next visit.   Thank you for choosing [place] at [facility] to provide your oncology and hematology care.  To afford each patient quality time with our provider, please arrive at least 15 minutes before your scheduled appointment time.   Beginning January 23rd 2017 lab work for the [facility] will be done in the  Main lab at [facility] on 1st floor. If you have a lab appointment with the [facility] [place] please come in thru the  Main Entrance and check in at the main information desk  You need to re-schedule your appointment should you arrive 10 or more minutes late.  We strive to give you quality time with our providers, and arriving late affects you and other patients whose appointments are after yours.  Also, if you no show three or more times for appointments you may be dismissed from the clinic at the providers discretion.     Again, thank you for choosing [facility].  Our hope is that these requests will decrease the amount of time that you wait before being seen by our physicians.       _____________________________________________________________  Should you have questions after your visit to [facility], please contact our office at [phone] between the hours of 8:30 a.m. and 4:30 p.m.  Voicemails left after 4:30 p.m. will not be returned until the following business day.  For prescription refill requests, have your pharmacy contact our office.         Resources For Cancer Patients and their Caregivers ? American Cancer Society: Can assist with transportation, wigs, general needs, runs  Look Good Feel Better.        (718)459-8537 ? Cancer Care: Provides financial assistance, online support groups, medication/co-pay assistance.  1-800-813-HOPE (229)396-3612) ? [employer] Assists [place] cancer patients and their families through emotional , educational and financial support.  507-141-9660 ? [place] DSS Where to apply for food stamps, Medicaid and utility assistance. 248 107 8167 ? RCATS: Transportation to medical appointments. (484)840-9839 ? Social Security Administration: May apply for disability if have a Stage IV cancer. (718)481-1058 580-389-4532 ? [employer], Disability and Transit Services: Assists with nutrition, care and transit needs. [place] Support Programs: '@10RELATIVEDAYS'$ @ > Cancer Support Group  2nd Tuesday of the month 1pm-2pm, Journey Room  > Creative Journey  3rd Tuesday of the month 1130am-1pm, Journey Room  > Look Good Feel Better  1st Wednesday of the month 10am-12 noon, Journey Room (Call [place] to register [phone])

## 2016-06-17 ENCOUNTER — Encounter (HOSPITAL_COMMUNITY): Payer: Self-pay | Admitting: *Deleted

## 2016-06-17 NOTE — Progress Notes (Signed)
Patient had to return to clinic a 2nd time on Friday 06/16/16 to have port needle remove from port a cath. Prior nurse, Ebony Hail, removed his pump and flushed him but did not remove the needle. Needle removed and bandaid applied to pt's port site. Site WNL. Gas card to be given to patient since he had to return to clinic.

## 2016-06-19 ENCOUNTER — Inpatient Hospital Stay (HOSPITAL_COMMUNITY): Payer: 59

## 2016-06-19 LAB — TISSUE HYBRIDIZATION TO NCBH

## 2016-06-21 ENCOUNTER — Encounter (HOSPITAL_COMMUNITY): Payer: 59

## 2016-06-21 ENCOUNTER — Encounter (HOSPITAL_COMMUNITY): Payer: Self-pay

## 2016-06-26 ENCOUNTER — Inpatient Hospital Stay (HOSPITAL_COMMUNITY): Payer: 59

## 2016-06-27 NOTE — Progress Notes (Signed)
Joshua Silversmith, NP [place] [address]  Malignant neoplasm of lower third of esophagus ([place]) - Plan: fentaNYL (DURAGESIC - DOSED MCG/HR) 25 MCG/HR patch, fentaNYL (DURAGESIC - DOSED MCG/HR) 12 MCG/HR, morphine (MSIR) 15 MG tablet  Liver metastases ([facility]) - Plan: fentaNYL (DURAGESIC - DOSED MCG/HR) 25 MCG/HR patch, fentaNYL (DURAGESIC - DOSED MCG/HR) 12 MCG/HR, morphine (MSIR) 15 MG tablet  Hypokalemia - Plan: potassium chloride SA (K-DUR,KLOR-CON) 20 MEQ tablet, DISCONTINUED: potassium chloride SA (K-DUR,KLOR-CON) CR tablet 40 mEq  CURRENT THERAPY: FOLFOX beginning on 05/31/2016  INTERVAL HISTORY: Joshua Greene 55 y.o. 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    Esophageal cancer ([place])   05/04/2016 Miscellaneous ED Visit secondary to bilateral ankle swelling   05/09/2016 Imaging Enlarged liver with innumerable hepatic hypodense lesions most c/w metastatic disease. Diffuse mesenteric edema as well as multiple enlarged mesenteric and retroperitoneal LN   05/15/2016 Imaging Two posterior RUL nodules, indeterminate, 17 mm short axis RL paraesophageal node worrisome for nodal metastasis. Site of primary remains indeterminate   05/16/2016 Initial Biopsy Ultrasound guided biopsy of R sided liver lesion   05/16/2016 Pathology Results Metastatic poorly differentiated carcinoma, immunoprofile not entirely specific. Favor poorly differentiated squamous cell carcinoma,    05/25/2016 Procedure EGD with Dr. Gala Romney with esophageal varices, area of punch out ulceration that appears to be neoplastic   05/25/2016 Pathology Results The neoplasm stained positive for CK5/6, p63 (squamous cell markers), MOC-31, CK7 (patchy). CDX-2 (weak), TTF-1 (non specific stain), and negative stains for CK20 and Nap A. The immunostain pattern and morphologyfavored poorly diff sq cell carcinoma esoph   05/29/2016 Procedure Port-A-Cath placed by interventional radiology   05/31/2016 -  Chemotherapy FOLFOX   He is tolerating treatment well.  We learned from the patient's wife that he feels so good that he is back to drinking EtOH.  We discussed his EtOH use.  He is advised to abstain, but to be frank, he will return back to EtOH consumption.  He thinks he can keep his EtOH consumption to one 12 oz can of beer per week.    He otherwise reports that his pain persists, but it better controlled. He notes that his pain averages a 6-7/10 throughout the day.  I will alter his pain medication, long-acting, slightly.  Review of Systems  Constitutional: Negative for fever and chills.  HENT: Negative.   Eyes: Negative.   Respiratory: Negative.   Cardiovascular: Negative.   Gastrointestinal: Positive for abdominal pain.       Abdominal distension  Genitourinary: Negative.   Musculoskeletal: Negative.   Skin: Negative.   Neurological: Negative.  Negative for weakness.  Endo/Heme/Allergies: Negative.   Psychiatric/Behavioral: Negative.      Past Medical History  Diagnosis Date  . Hypertension   . History of stomach ulcers   . Chicken pox   . Alcohol abuse   . Chronic bronchitis ([place])   . Erectile dysfunction   . Esophageal cancer ([facility]) 05/22/2016    Past Surgical History  Procedure Laterality Date  . Appendectomy  1978  . Hernia repair    . Esophagogastroduodenoscopy N/A 05/25/2016    Procedure: ESOPHAGOGASTRODUODENOSCOPY (EGD);  Surgeon: Daneil Dolin, MD;  Location: [facility];  Service: Endoscopy;  Laterality: N/A;    Family History  Problem Relation Age of Onset  . Hypertension Mother   . Diabetes Mother   . Cancer Neg  Hx   . Heart disease Neg Hx   . Stroke Neg Hx     Social History   Social History  . Marital Status: Single    Spouse Name: N/A  . Number of Children: N/A  . Years of Education: N/A   Social History Main Topics  . Smoking status: Never Smoker   .  Smokeless tobacco: Current User    Types: Chew     Comment: 5 years  . Alcohol Use: Yes     Comment: occ  . Drug Use: No  . Sexual Activity: Yes   Other Topics Concern  . None   Social History Narrative     PHYSICAL EXAMINATION  ECOG PERFORMANCE STATUS: 2 - Symptomatic, <50% confined to bed  There were no vitals filed for this visit.  BP 120/74 P 99 R 20 T 98.6 F O2 100% RA.  GENERAL:alert, cachectic, smiling, appearing much improved, more talkative and alert than in the past, accompanied by his fiancee.  SKIN: skin color, texture, turgor are normal, no rashes or significant lesions HEAD: Normocephalic EYES: Conjunctiva are pink and non-injected EARS: External ears normal OROPHARYNX:lips, buccal mucosa, and tongue normal and mucous membranes are moist  NECK: supple, trachea midline LYMPH:  not examined BREAST:not examined LUNGS: clear to auscultation and percussion HEART: regular rate & rhythm without murmur, rub, or gallop. ABDOMEN:abdomen soft, distended, normal bowel sounds and hepatomegaly noted. BACK: Back symmetric, no curvature. EXTREMITIES:less then 2 second capillary refill, no skin discoloration, no cyanosis, positive findings:  edema B/L 1 + pitting edema in LE (much improved).  NEURO: alert & oriented x 3 with fluent speech, no focal motor/sensory deficits, gait normal   LABORATORY DATA: CBC    Component Value Date/Time   WBC 7.6 06/28/2016 0952   WBC 6.5 12/03/2014 1205   RBC 4.19* 06/28/2016 0952   RBC 4.78 12/03/2014 1205   HGB 12.7* 06/28/2016 0952   HGB 15.1 12/03/2014 1205   HCT 37.1* 06/28/2016 0952   HCT 45.7 12/03/2014 1205   PLT 135* 06/28/2016 0952   PLT 262 12/03/2014 1205   MCV 88.5 06/28/2016 0952   MCV 96 12/03/2014 1205   MCH 30.3 06/28/2016 0952   MCH 31.6 12/03/2014 1205   MCHC 34.2 06/28/2016 0952   MCHC 33.1 12/03/2014 1205   RDW 19.7* 06/28/2016 0952   RDW 13.3 12/03/2014 1205   LYMPHSABS 2.8 06/28/2016 0952   LYMPHSABS  1.0 01/14/2014 2000   MONOABS 1.4* 06/28/2016 0952   MONOABS 1.0 01/14/2014 2000   EOSABS 0.1 06/28/2016 0952   EOSABS 0.0 01/14/2014 2000   BASOSABS 0.1 06/28/2016 0952   BASOSABS 0.0 01/14/2014 2000      Chemistry      Component Value Date/Time   NA 131* 06/28/2016 0952   NA 137 12/03/2014 1205   K 3.3* 06/28/2016 0952   K 3.8 12/03/2014 1205   CL 98* 06/28/2016 0952   CL 97* 12/03/2014 1205   CO2 27 06/28/2016 0952   CO2 30 12/03/2014 1205   BUN 9 06/28/2016 0952   BUN 8 12/03/2014 1205   CREATININE 0.40* 06/28/2016 0952   CREATININE 0.69 12/03/2014 1205      Component Value Date/Time   CALCIUM 7.7* 06/28/2016 0952   CALCIUM 9.1 12/03/2014 1205   ALKPHOS 245* 06/28/2016 0952   ALKPHOS 71 12/03/2014 1205   AST 81* 06/28/2016 0952   AST 51* 12/03/2014 1205   ALT 59 06/28/2016 0952   ALT 42 12/03/2014 1205  BILITOT 2.0* 06/28/2016 0952   BILITOT 0.9 12/03/2014 1205        PENDING LABS:   RADIOGRAPHIC STUDIES:  Ir Fluoro Guide Cv Line Right  05/29/2016  INDICATION: History of metastatic carcinoma of unknown primary. In need of durable intravenous access for chemotherapy administration. EXAM: IMPLANTED PORT A CATH PLACEMENT WITH ULTRASOUND AND FLUOROSCOPIC GUIDANCE COMPARISON:  None. MEDICATIONS: Ancef 2 gm IV; The antibiotic was administered within an appropriate time interval prior to skin puncture. ANESTHESIA/SEDATION: Moderate (conscious) sedation was employed during this procedure. A total of Versed 2 mg and Fentanyl 50 mcg was administered intravenously. Moderate Sedation Time: 24 minutes. The patient's level of consciousness and vital signs were monitored continuously by radiology nursing throughout the procedure under my direct supervision. CONTRAST:  None FLUOROSCOPY TIME:  18 seconds (4 mGy) COMPLICATIONS: None immediate. PROCEDURE: The procedure, risks, benefits, and alternatives were explained to the patient. Questions regarding the procedure were encouraged  and answered. The patient understands and consents to the procedure. The right neck and chest were prepped with chlorhexidine in a sterile fashion, and a sterile drape was applied covering the operative field. Maximum barrier sterile technique with sterile gowns and gloves were used for the procedure. A timeout was performed prior to the initiation of the procedure. Local anesthesia was provided with 1% lidocaine with epinephrine. After creating a small venotomy incision, a micropuncture kit was utilized to access the internal jugular vein under direct, real-time ultrasound guidance. Ultrasound image documentation was performed. The microwire was kinked to measure appropriate catheter length. A subcutaneous port pocket was then created along the upper chest wall utilizing a combination of sharp and blunt dissection. The pocket was irrigated with sterile saline. A single lumen thin power injectable port was chosen for placement. The 8 Fr catheter was tunneled from the port pocket site to the venotomy incision. The port was placed in the pocket. The external catheter was trimmed to appropriate length. At the venotomy, an 8 Fr peel-away sheath was placed over a guidewire under fluoroscopic guidance. The catheter was then placed through the sheath and the sheath was removed. Final catheter positioning was confirmed and documented with a fluoroscopic spot radiograph. The port was accessed with a Huber needle, aspirated and flushed with heparinized saline. The venotomy site was closed with an interrupted 4-0 Vicryl suture. The port pocket incision was closed with interrupted 2-0 Vicryl suture and the skin was opposed with a running subcuticular 4-0 Vicryl suture. Dermabond and Steri-strips were applied to both incisions. Dressings were placed. The patient tolerated the procedure well without immediate post procedural complication. FINDINGS: After catheter placement, the tip lies within the superior cavoatrial junction.  The catheter aspirates and flushes normally and is ready for immediate use. IMPRESSION: Successful placement of a right internal jugular approach power injectable Port-A-Cath. The catheter is ready for immediate use. Electronically Signed   By: Sandi Mariscal M.D.   On: 05/29/2016 16:17   Ir US Guide Vasc Access Right  05/29/2016  INDICATION: History of metastatic carcinoma of unknown primary. In need of durable intravenous access for chemotherapy administration. EXAM: IMPLANTED PORT A CATH PLACEMENT WITH ULTRASOUND AND FLUOROSCOPIC GUIDANCE COMPARISON:  None. MEDICATIONS: Ancef 2 gm IV; The antibiotic was administered within an appropriate time interval prior to skin puncture. ANESTHESIA/SEDATION: Moderate (conscious) sedation was employed during this procedure. A total of Versed 2 mg and Fentanyl 50 mcg was administered intravenously. Moderate Sedation Time: 24 minutes. The patient's level of consciousness and vital signs were monitored continuously  by radiology nursing throughout the procedure under my direct supervision. CONTRAST:  None FLUOROSCOPY TIME:  18 seconds (4 mGy) COMPLICATIONS: None immediate. PROCEDURE: The procedure, risks, benefits, and alternatives were explained to the patient. Questions regarding the procedure were encouraged and answered. The patient understands and consents to the procedure. The right neck and chest were prepped with chlorhexidine in a sterile fashion, and a sterile drape was applied covering the operative field. Maximum barrier sterile technique with sterile gowns and gloves were used for the procedure. A timeout was performed prior to the initiation of the procedure. Local anesthesia was provided with 1% lidocaine with epinephrine. After creating a small venotomy incision, a micropuncture kit was utilized to access the internal jugular vein under direct, real-time ultrasound guidance. Ultrasound image documentation was performed. The microwire was kinked to measure  appropriate catheter length. A subcutaneous port pocket was then created along the upper chest wall utilizing a combination of sharp and blunt dissection. The pocket was irrigated with sterile saline. A single lumen thin power injectable port was chosen for placement. The 8 Fr catheter was tunneled from the port pocket site to the venotomy incision. The port was placed in the pocket. The external catheter was trimmed to appropriate length. At the venotomy, an 8 Fr peel-away sheath was placed over a guidewire under fluoroscopic guidance. The catheter was then placed through the sheath and the sheath was removed. Final catheter positioning was confirmed and documented with a fluoroscopic spot radiograph. The port was accessed with a Huber needle, aspirated and flushed with heparinized saline. The venotomy site was closed with an interrupted 4-0 Vicryl suture. The port pocket incision was closed with interrupted 2-0 Vicryl suture and the skin was opposed with a running subcuticular 4-0 Vicryl suture. Dermabond and Steri-strips were applied to both incisions. Dressings were placed. The patient tolerated the procedure well without immediate post procedural complication. FINDINGS: After catheter placement, the tip lies within the superior cavoatrial junction. The catheter aspirates and flushes normally and is ready for immediate use. IMPRESSION: Successful placement of a right internal jugular approach power injectable Port-A-Cath. The catheter is ready for immediate use. Electronically Signed   By: Sandi Mariscal M.D.   On: 05/29/2016 16:17     PATHOLOGY:    ASSESSMENT AND PLAN:  Esophageal cancer ([place]) Stage IV poorly differentiated squamous cell carcinoma of the esophagus with significant liver metastases.  Began palliative systemic chemotherapy on 05/31/2016 with FOLFOX.  Oncotype ID was sent off and reported back as Sarcoma.  This was discussed in [employer]  and pathology is very confident, as are with  regards to response with therapy, that we are not dealing with a sarcoma.  Oncology history updated.  Labs today: CBC diff, CMET.  I personally reviewed and went over laboratory results with the patient.  The results are noted within this dictation.  Liver function continues to improve from a laboratory standpoint.  Hypokalemia is noted.  I have printed a new K+ prescription for the patient to take 20 mEq daily.  In the clinic, I will give him 40 mEq of Kdur.  Order is signed and held.  CBC is great.  We will need to watch his platelet count moving forward.  We learned from the patient's fiancee that he is feeling better; so much so that he had a friend take him to the liquor store to get a "40."  He notes that he is consuming one 12 oz beer per week.  He is advised to  not exceed this given his extensive liver disease.  He notes that his pain is much improved, but reports a persistence of pain at 7/10.  He is currently on Fentanyl 25 mcg/hr.  I will increase to 37.5 mcg.  I will also refill his short-acting pain medication.  He is advised that he is not to receive pain medication from any other provider.  I have reviewed his pain medication refills on [place] Controlled Substance Reporting System website.  Clinically, he looks much improved.  He is smiling.  He is more talkative and alert.  He feels better as well; evidence of this is his desire to return to EtOH consumption.  Code status and goals of care will need to be addressed in near future.  Return in 2 weeks for follow-up and next cycle of chemotherapy.    ORDERS PLACED FOR THIS ENCOUNTER: No orders of the defined types were placed in this encounter.    MEDICATIONS PRESCRIBED THIS ENCOUNTER: Meds ordered this encounter  Medications  . fentaNYL (DURAGESIC - DOSED MCG/HR) 25 MCG/HR patch    Sig: Place 1 patch (25 mcg total) onto the skin every 3 (three) days.    Dispense:  10 patch    Refill:  0    37.5 mcg    Order Specific Question:   Supervising Provider    Answer:  Patrici Ranks [id number]  . fentaNYL (DURAGESIC - DOSED MCG/HR) 12 MCG/HR    Sig: Place 1 patch (12.5 mcg total) onto the skin every 3 (three) days.    Dispense:  10 patch    Refill:  0    37.5 mcg    Order Specific Question:  Supervising Provider    Answer:  Patrici Ranks [id number]  . morphine (MSIR) 15 MG tablet    Sig: Take 1 tablet (15 mg total) by mouth every 6 (six) hours as needed for severe pain.    Dispense:  60 tablet    Refill:  0    Order Specific Question:  Supervising Provider    Answer:  Patrici Ranks [id number]  . potassium chloride SA (K-DUR,KLOR-CON) 20 MEQ tablet    Sig: Take 1 tablet (20 mEq total) by mouth daily.    Dispense:  30 tablet    Refill:  1    Order Specific Question:  Supervising Provider    Answer:  Patrici Ranks [id number]    THERAPY PLAN:  Continue treatment as planned with supportive care.  All questions were answered. The patient knows to call the clinic with any problems, questions or concerns. We can certainly see the patient much sooner if necessary.  Patient and plan discussed with Dr. Ancil Linsey and she is in agreement with the aforementioned.   This note is electronically signed by: Doy Mince 06/28/2016 11:43 AM

## 2016-06-27 NOTE — Assessment & Plan Note (Addendum)
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  Began palliative systemic chemotherapy on 05/31/2016 with FOLFOX.  Oncotype ID was sent off and reported back as Sarcoma.  This was discussed in [employer]  and pathology is very confident, as are with regards to response with therapy, that we are not dealing with a sarcoma.  Oncology history updated.  Labs today: CBC diff, CMET.  I personally reviewed and went over laboratory results with the patient.  The results are noted within this dictation.  Liver function continues to improve from a laboratory standpoint.  Hypokalemia is noted.  I have printed a new K+ prescription for the patient to take 20 mEq daily.  In the clinic, I will give him 40 mEq of Kdur.  Order is signed and held.  CBC is great.  We will need to watch his platelet count moving forward.  We learned from the patient's fiancee that he is feeling better; so much so that he had a friend take him to the liquor store to get a "40."  He notes that he is consuming one 12 oz beer per week.  He is advised to not exceed this given his extensive liver disease.  He notes that his pain is much improved, but reports a persistence of pain at 7/10.  He is currently on Fentanyl 25 mcg/hr.  I will increase to 37.5 mcg.  I will also refill his short-acting pain medication.  He is advised that he is not to receive pain medication from any other provider.  I have reviewed his pain medication refills on [place] Controlled Substance Reporting System website.  Clinically, he looks much improved.  He is smiling.  He is more talkative and alert.  He feels better as well; evidence of this is his desire to return to EtOH consumption.  Code status and goals of care will need to be addressed in near future.  Return in 2 weeks for follow-up and next cycle of chemotherapy.

## 2016-06-28 ENCOUNTER — Encounter (HOSPITAL_COMMUNITY): Payer: Self-pay | Admitting: Oncology

## 2016-06-28 ENCOUNTER — Encounter (HOSPITAL_BASED_OUTPATIENT_CLINIC_OR_DEPARTMENT_OTHER): Payer: 59

## 2016-06-28 ENCOUNTER — Encounter (HOSPITAL_COMMUNITY): Payer: Self-pay

## 2016-06-28 ENCOUNTER — Encounter (HOSPITAL_COMMUNITY): Payer: 59

## 2016-06-28 ENCOUNTER — Inpatient Hospital Stay (HOSPITAL_COMMUNITY): Payer: 59

## 2016-06-28 ENCOUNTER — Encounter (HOSPITAL_COMMUNITY): Payer: 59 | Attending: Hematology & Oncology | Admitting: Oncology

## 2016-06-28 VITALS — BP 129/83 | HR 97 | Temp 98.9°F | Resp 20 | Wt 165.4 lb

## 2016-06-28 DIAGNOSIS — C155 Malignant neoplasm of lower third of esophagus: Secondary | ICD-10-CM | POA: Diagnosis not present

## 2016-06-28 DIAGNOSIS — Z833 Family history of diabetes mellitus: Secondary | ICD-10-CM | POA: Diagnosis not present

## 2016-06-28 DIAGNOSIS — Z5111 Encounter for antineoplastic chemotherapy: Secondary | ICD-10-CM | POA: Diagnosis not present

## 2016-06-28 DIAGNOSIS — R64 Cachexia: Secondary | ICD-10-CM | POA: Insufficient documentation

## 2016-06-28 DIAGNOSIS — G893 Neoplasm related pain (acute) (chronic): Secondary | ICD-10-CM | POA: Insufficient documentation

## 2016-06-28 DIAGNOSIS — Z720 Tobacco use: Secondary | ICD-10-CM | POA: Insufficient documentation

## 2016-06-28 DIAGNOSIS — Z8249 Family history of ischemic heart disease and other diseases of the circulatory system: Secondary | ICD-10-CM | POA: Diagnosis not present

## 2016-06-28 DIAGNOSIS — Z8719 Personal history of other diseases of the digestive system: Secondary | ICD-10-CM | POA: Diagnosis not present

## 2016-06-28 DIAGNOSIS — I10 Essential (primary) hypertension: Secondary | ICD-10-CM | POA: Insufficient documentation

## 2016-06-28 DIAGNOSIS — Z809 Family history of malignant neoplasm, unspecified: Secondary | ICD-10-CM | POA: Insufficient documentation

## 2016-06-28 DIAGNOSIS — Z823 Family history of stroke: Secondary | ICD-10-CM | POA: Insufficient documentation

## 2016-06-28 DIAGNOSIS — R490 Dysphonia: Secondary | ICD-10-CM | POA: Diagnosis not present

## 2016-06-28 DIAGNOSIS — E876 Hypokalemia: Secondary | ICD-10-CM | POA: Diagnosis not present

## 2016-06-28 DIAGNOSIS — C787 Secondary malignant neoplasm of liver and intrahepatic bile duct: Secondary | ICD-10-CM | POA: Insufficient documentation

## 2016-06-28 DIAGNOSIS — C159 Malignant neoplasm of esophagus, unspecified: Secondary | ICD-10-CM | POA: Diagnosis not present

## 2016-06-28 DIAGNOSIS — Z9889 Other specified postprocedural states: Secondary | ICD-10-CM | POA: Insufficient documentation

## 2016-06-28 DIAGNOSIS — N529 Male erectile dysfunction, unspecified: Secondary | ICD-10-CM | POA: Insufficient documentation

## 2016-06-28 DIAGNOSIS — J42 Unspecified chronic bronchitis: Secondary | ICD-10-CM | POA: Insufficient documentation

## 2016-06-28 DIAGNOSIS — Z79899 Other long term (current) drug therapy: Secondary | ICD-10-CM | POA: Diagnosis not present

## 2016-06-28 LAB — CBC WITH DIFFERENTIAL/PLATELET
BASOS ABS: 0.1 10*3/uL (ref 0.0–0.1)
Basophils Relative: 1 %
EOS ABS: 0.1 10*3/uL (ref 0.0–0.7)
Eosinophils Relative: 1 %
HCT: 37.1 % — ABNORMAL LOW (ref 39.0–52.0)
Hemoglobin: 12.7 g/dL — ABNORMAL LOW (ref 13.0–17.0)
LYMPHS ABS: 2.8 10*3/uL (ref 0.7–4.0)
LYMPHS PCT: 37 %
MCH: 30.3 pg (ref 26.0–34.0)
MCHC: 34.2 g/dL (ref 30.0–36.0)
MCV: 88.5 fL (ref 78.0–100.0)
Monocytes Absolute: 1.4 10*3/uL — ABNORMAL HIGH (ref 0.1–1.0)
Monocytes Relative: 18 %
NEUTROS PCT: 43 %
Neutro Abs: 3.3 10*3/uL (ref 1.7–7.7)
PLATELETS: 135 10*3/uL — AB (ref 150–400)
RBC: 4.19 MIL/uL — AB (ref 4.22–5.81)
RDW: 19.7 % — ABNORMAL HIGH (ref 11.5–15.5)
WBC: 7.6 10*3/uL (ref 4.0–10.5)

## 2016-06-28 LAB — COMPREHENSIVE METABOLIC PANEL
ALBUMIN: 2.1 g/dL — AB (ref 3.5–5.0)
ALT: 59 U/L (ref 17–63)
AST: 81 U/L — AB (ref 15–41)
Alkaline Phosphatase: 245 U/L — ABNORMAL HIGH (ref 38–126)
Anion gap: 6 (ref 5–15)
BUN: 9 mg/dL (ref 6–20)
CHLORIDE: 98 mmol/L — AB (ref 101–111)
CO2: 27 mmol/L (ref 22–32)
CREATININE: 0.4 mg/dL — AB (ref 0.61–1.24)
Calcium: 7.7 mg/dL — ABNORMAL LOW (ref 8.9–10.3)
GFR calc Af Amer: >60 mL/min (ref >60)
GLUCOSE: 101 mg/dL — AB (ref 65–99)
POTASSIUM: 3.3 mmol/L — AB (ref 3.5–5.1)
Sodium: 131 mmol/L — ABNORMAL LOW (ref 135–145)
Total Bilirubin: 2 mg/dL — ABNORMAL HIGH (ref 0.3–1.2)
Total Protein: 6.2 g/dL — ABNORMAL LOW (ref 6.5–8.1)

## 2016-06-28 MED ORDER — POTASSIUM CHLORIDE CRYS ER 20 MEQ PO TBCR: 20.0000 meq | EXTENDED_RELEASE_TABLET | Freq: Every day | ORAL | Status: DC

## 2016-06-28 MED ORDER — POTASSIUM CHLORIDE CRYS ER 20 MEQ PO TBCR
40.0000 meq | EXTENDED_RELEASE_TABLET | Freq: Once | ORAL | Status: AC
  Administered 2016-06-28: 40 meq via ORAL
  Filled 2016-06-28: qty 2

## 2016-06-28 MED ORDER — FENTANYL 12 MCG/HR TD PT72: 12.5000 ug | MEDICATED_PATCH | TRANSDERMAL | Status: DC

## 2016-06-28 MED ORDER — SODIUM CHLORIDE 0.9 % IV SOLN
1680.0000 mg/m2 | INTRAVENOUS | Status: DC
  Administered 2016-06-28: 3400 mg via INTRAVENOUS
  Filled 2016-06-28: qty 68

## 2016-06-28 MED ORDER — LEUCOVORIN CALCIUM INJECTION 350 MG
400.0000 mg/m2 | Freq: Once | INTRAVENOUS | Status: AC
  Administered 2016-06-28: 804 mg via INTRAVENOUS
  Filled 2016-06-28: qty 40.2

## 2016-06-28 MED ORDER — OXALIPLATIN CHEMO INJECTION 100 MG/20ML
65.0000 mg/m2 | Freq: Once | INTRAVENOUS | Status: AC
  Administered 2016-06-28: 130 mg via INTRAVENOUS
  Filled 2016-06-28: qty 26

## 2016-06-28 MED ORDER — SODIUM CHLORIDE 0.9% FLUSH: 10.0000 mL | INTRAVENOUS | Status: DC | PRN

## 2016-06-28 MED ORDER — MORPHINE SULFATE 15 MG PO TABS: 15.0000 mg | ORAL_TABLET | Freq: Four times a day (QID) | ORAL | Status: DC | PRN

## 2016-06-28 MED ORDER — DEXTROSE 5 % IV SOLN
Freq: Once | INTRAVENOUS | Status: AC
  Administered 2016-06-28: 11:00:00 via INTRAVENOUS

## 2016-06-28 MED ORDER — SODIUM CHLORIDE 0.9 % IV SOLN
20.0000 mg | Freq: Once | INTRAVENOUS | Status: AC
  Administered 2016-06-28: 20 mg via INTRAVENOUS
  Filled 2016-06-28: qty 2

## 2016-06-28 MED ORDER — PALONOSETRON HCL INJECTION 0.25 MG/5ML
INTRAVENOUS | Status: AC
  Filled 2016-06-28: qty 5

## 2016-06-28 MED ORDER — HEPARIN SOD (PORK) LOCK FLUSH 100 UNIT/ML IV SOLN: 500.0000 [IU] | Freq: Once | INTRAVENOUS | Status: DC | PRN

## 2016-06-28 MED ORDER — PALONOSETRON HCL INJECTION 0.25 MG/5ML
0.2500 mg | Freq: Once | INTRAVENOUS | Status: AC
  Administered 2016-06-28: 0.25 mg via INTRAVENOUS

## 2016-06-28 MED ORDER — FENTANYL 25 MCG/HR TD PT72: 25.0000 ug | MEDICATED_PATCH | TRANSDERMAL | Status: DC

## 2016-06-28 NOTE — Progress Notes (Signed)
Tolerated FOLFOX without any problems. 21f pump connected @ 1423.

## 2016-06-30 ENCOUNTER — Encounter (HOSPITAL_BASED_OUTPATIENT_CLINIC_OR_DEPARTMENT_OTHER): Payer: 59

## 2016-06-30 VITALS — BP 129/75 | HR 99 | Temp 98.1°F | Resp 18 | Wt 168.0 lb

## 2016-06-30 DIAGNOSIS — C787 Secondary malignant neoplasm of liver and intrahepatic bile duct: Secondary | ICD-10-CM

## 2016-06-30 DIAGNOSIS — C155 Malignant neoplasm of lower third of esophagus: Secondary | ICD-10-CM | POA: Diagnosis not present

## 2016-06-30 DIAGNOSIS — C159 Malignant neoplasm of esophagus, unspecified: Secondary | ICD-10-CM | POA: Diagnosis not present

## 2016-06-30 DIAGNOSIS — Z452 Encounter for adjustment and management of vascular access device: Secondary | ICD-10-CM | POA: Diagnosis not present

## 2016-06-30 MED ORDER — HEPARIN SOD (PORK) LOCK FLUSH 100 UNIT/ML IV SOLN
500.0000 [IU] | Freq: Once | INTRAVENOUS | Status: AC | PRN
  Administered 2016-06-30: 500 [IU]

## 2016-06-30 MED ORDER — HEPARIN SOD (PORK) LOCK FLUSH 100 UNIT/ML IV SOLN
INTRAVENOUS | Status: AC
  Filled 2016-06-30: qty 5

## 2016-06-30 MED ORDER — SODIUM CHLORIDE 0.9% FLUSH
10.0000 mL | INTRAVENOUS | Status: DC | PRN
  Administered 2016-06-30: 10 mL
  Filled 2016-06-30: qty 10

## 2016-06-30 NOTE — Progress Notes (Signed)
[facility] [place] presented for Portacath access and flush. Portacath located right chest wall deaccessed 20 gauge needle. Good blood return present. Portacath flushed with 31m NS and 500U/566mHeparin and needle removed intact. Procedure without incident. Patient tolerated procedure well. Pump removed

## 2016-06-30 NOTE — Patient Instructions (Signed)
[place] at [facility] Discharge Instructions  RECOMMENDATIONS MADE BY THE CONSULTANT AND ANY TEST RESULTS WILL BE SENT TO YOUR REFERRING PHYSICIAN.  Pump removed and port flushed. Follow up as scheduled Call for any concerns or questions  Thank you for choosing [place] at [facility] to provide your oncology and hematology care.  To afford each patient quality time with our provider, please arrive at least 15 minutes before your scheduled appointment time.   Beginning January 23rd 2017 lab work for the [facility] will be done in the  Main lab at [facility] on 1st floor. If you have a lab appointment with the [facility] please come in thru the  Main Entrance and check in at the main information desk  You need to re-schedule your appointment should you arrive 10 or more minutes late.  We strive to give you quality time with our providers, and arriving late affects you and other patients whose appointments are after yours.  Also, if you no show three or more times for appointments you may be dismissed from the clinic at the providers discretion.     Again, thank you for choosing [facility].  Our hope is that these requests will decrease the amount of time that you wait before being seen by our physicians.       _____________________________________________________________  Should you have questions after your visit to [facility], please contact our office at [phone] between the hours of 8:30 a.m. and 4:30 p.m.  Voicemails left after 4:30 p.m. will not be returned until the following business day.  For prescription refill requests, have your pharmacy contact our office.         Resources For Cancer Patients and their Caregivers ? American Cancer Society: Can assist with transportation, wigs, general needs, runs Look Good Feel Better.        947-052-0975 ? Cancer Care: Provides financial  assistance, online support groups, medication/co-pay assistance.  1-800-813-HOPE 564-373-9150) ? [place] Assists [place] cancer patients and their families through emotional , educational and financial support.  412-843-3015 ? [place] DSS Where to apply for food stamps, Medicaid and utility assistance. (305) 341-7998 ? [facility]: Transportation to medical appointments. (775)523-9963 ? Social Security Administration: May apply for disability if have a Stage IV cancer. 615-508-0042 6572755696 ? [employer], Disability and Transit Services: Assists with nutrition, care and transit needs. [place] Support Programs: '@10RELATIVEDAYS'$ @ > Cancer Support Group  2nd Tuesday of the month 1pm-2pm, Journey Room  > Creative Journey  3rd Tuesday of the month 1130am-1pm, Journey Room  > Look Good Feel Better  1st Wednesday of the month 10am-12 noon, Journey Room (Call [facility] to register [phone])

## 2016-07-03 ENCOUNTER — Inpatient Hospital Stay (HOSPITAL_COMMUNITY): Payer: 59

## 2016-07-03 ENCOUNTER — Ambulatory Visit (HOSPITAL_COMMUNITY): Payer: 59 | Admitting: Hematology & Oncology

## 2016-07-05 ENCOUNTER — Other Ambulatory Visit (HOSPITAL_COMMUNITY): Payer: Self-pay | Admitting: Oncology

## 2016-07-05 ENCOUNTER — Ambulatory Visit (HOSPITAL_COMMUNITY): Payer: 59 | Admitting: Hematology & Oncology

## 2016-07-05 ENCOUNTER — Encounter (HOSPITAL_COMMUNITY): Payer: 59

## 2016-07-10 ENCOUNTER — Telehealth (HOSPITAL_COMMUNITY): Payer: Self-pay | Admitting: *Deleted

## 2016-07-10 ENCOUNTER — Inpatient Hospital Stay (HOSPITAL_COMMUNITY): Payer: 59

## 2016-07-12 ENCOUNTER — Encounter (HOSPITAL_BASED_OUTPATIENT_CLINIC_OR_DEPARTMENT_OTHER): Payer: 59

## 2016-07-12 ENCOUNTER — Encounter: Payer: Self-pay | Admitting: Dietician

## 2016-07-12 ENCOUNTER — Inpatient Hospital Stay (HOSPITAL_COMMUNITY): Payer: 59

## 2016-07-12 ENCOUNTER — Encounter (HOSPITAL_COMMUNITY): Payer: Self-pay | Admitting: Hematology & Oncology

## 2016-07-12 ENCOUNTER — Encounter (HOSPITAL_BASED_OUTPATIENT_CLINIC_OR_DEPARTMENT_OTHER): Payer: 59 | Admitting: Hematology & Oncology

## 2016-07-12 ENCOUNTER — Encounter (HOSPITAL_COMMUNITY): Payer: 59

## 2016-07-12 ENCOUNTER — Telehealth: Payer: Self-pay | Admitting: *Deleted

## 2016-07-12 VITALS — BP 125/73 | HR 98 | Temp 98.2°F | Resp 18 | Wt 171.2 lb

## 2016-07-12 VITALS — BP 144/90 | HR 92 | Temp 97.7°F | Resp 18

## 2016-07-12 DIAGNOSIS — R197 Diarrhea, unspecified: Secondary | ICD-10-CM

## 2016-07-12 DIAGNOSIS — R490 Dysphonia: Secondary | ICD-10-CM

## 2016-07-12 DIAGNOSIS — C159 Malignant neoplasm of esophagus, unspecified: Secondary | ICD-10-CM | POA: Diagnosis not present

## 2016-07-12 DIAGNOSIS — Z9889 Other specified postprocedural states: Secondary | ICD-10-CM | POA: Diagnosis not present

## 2016-07-12 DIAGNOSIS — C787 Secondary malignant neoplasm of liver and intrahepatic bile duct: Secondary | ICD-10-CM

## 2016-07-12 DIAGNOSIS — Z8719 Personal history of other diseases of the digestive system: Secondary | ICD-10-CM | POA: Diagnosis not present

## 2016-07-12 DIAGNOSIS — J42 Unspecified chronic bronchitis: Secondary | ICD-10-CM | POA: Diagnosis not present

## 2016-07-12 DIAGNOSIS — G893 Neoplasm related pain (acute) (chronic): Secondary | ICD-10-CM | POA: Diagnosis not present

## 2016-07-12 DIAGNOSIS — R739 Hyperglycemia, unspecified: Secondary | ICD-10-CM

## 2016-07-12 DIAGNOSIS — Z720 Tobacco use: Secondary | ICD-10-CM

## 2016-07-12 DIAGNOSIS — Z7289 Other problems related to lifestyle: Secondary | ICD-10-CM

## 2016-07-12 DIAGNOSIS — I10 Essential (primary) hypertension: Secondary | ICD-10-CM | POA: Diagnosis not present

## 2016-07-12 DIAGNOSIS — Z5111 Encounter for antineoplastic chemotherapy: Secondary | ICD-10-CM | POA: Diagnosis not present

## 2016-07-12 DIAGNOSIS — N529 Male erectile dysfunction, unspecified: Secondary | ICD-10-CM | POA: Diagnosis not present

## 2016-07-12 DIAGNOSIS — C155 Malignant neoplasm of lower third of esophagus: Secondary | ICD-10-CM

## 2016-07-12 LAB — CBC WITH DIFFERENTIAL/PLATELET
Basophils Absolute: 0 10*3/uL (ref 0.0–0.1)
Basophils Relative: 0 %
EOS PCT: 0 %
Eosinophils Absolute: 0 10*3/uL (ref 0.0–0.7)
HEMATOCRIT: 37.3 % — AB (ref 39.0–52.0)
HEMOGLOBIN: 12.6 g/dL — AB (ref 13.0–17.0)
LYMPHS ABS: 1.7 10*3/uL (ref 0.7–4.0)
LYMPHS PCT: 17 %
MCH: 30.7 pg (ref 26.0–34.0)
MCHC: 33.8 g/dL (ref 30.0–36.0)
MCV: 90.8 fL (ref 78.0–100.0)
MONO ABS: 1.7 10*3/uL — AB (ref 0.1–1.0)
Monocytes Relative: 17 %
Neutro Abs: 6.8 10*3/uL (ref 1.7–7.7)
Neutrophils Relative %: 66 %
Platelets: 145 10*3/uL — ABNORMAL LOW (ref 150–400)
RBC: 4.11 MIL/uL — ABNORMAL LOW (ref 4.22–5.81)
RDW: 20.5 % — ABNORMAL HIGH (ref 11.5–15.5)
WBC: 10.2 10*3/uL (ref 4.0–10.5)

## 2016-07-12 LAB — GASTROINTESTINAL PANEL BY PCR, STOOL (REPLACES STOOL CULTURE)
ADENOVIRUS F40/41: NOT DETECTED
Astrovirus: NOT DETECTED
CAMPYLOBACTER SPECIES: NOT DETECTED
CRYPTOSPORIDIUM: NOT DETECTED
Cyclospora cayetanensis: NOT DETECTED
E. coli O157: NOT DETECTED
ENTEROAGGREGATIVE E COLI (EAEC): NOT DETECTED
ENTEROPATHOGENIC E COLI (EPEC): DETECTED — AB
Entamoeba histolytica: NOT DETECTED
Enterotoxigenic E coli (ETEC): NOT DETECTED
GIARDIA LAMBLIA: NOT DETECTED
NOROVIRUS GI/GII: NOT DETECTED
PLESIMONAS SHIGELLOIDES: NOT DETECTED
ROTAVIRUS A: NOT DETECTED
SALMONELLA SPECIES: NOT DETECTED
SHIGA LIKE TOXIN PRODUCING E COLI (STEC): NOT DETECTED
SHIGELLA/ENTEROINVASIVE E COLI (EIEC): NOT DETECTED
Sapovirus (I, II, IV, and V): NOT DETECTED
Vibrio cholerae: NOT DETECTED
Vibrio species: NOT DETECTED
YERSINIA ENTEROCOLITICA: NOT DETECTED

## 2016-07-12 LAB — COMPREHENSIVE METABOLIC PANEL
ALBUMIN: 2.3 g/dL — AB (ref 3.5–5.0)
ALK PHOS: 236 U/L — AB (ref 38–126)
ALT: 53 U/L (ref 17–63)
ANION GAP: 6 (ref 5–15)
AST: 55 U/L — ABNORMAL HIGH (ref 15–41)
BILIRUBIN TOTAL: 2.2 mg/dL — AB (ref 0.3–1.2)
BUN: 16 mg/dL (ref 6–20)
CALCIUM: 7.8 mg/dL — AB (ref 8.9–10.3)
CO2: 30 mmol/L (ref 22–32)
Chloride: 93 mmol/L — ABNORMAL LOW (ref 101–111)
GLUCOSE: 269 mg/dL — AB (ref 65–99)
Potassium: 3.3 mmol/L — ABNORMAL LOW (ref 3.5–5.1)
Sodium: 129 mmol/L — ABNORMAL LOW (ref 135–145)
TOTAL PROTEIN: 6.5 g/dL (ref 6.5–8.1)

## 2016-07-12 MED ORDER — LEUCOVORIN CALCIUM INJECTION 350 MG
400.0000 mg/m2 | Freq: Once | INTRAMUSCULAR | Status: AC
  Administered 2016-07-12: 804 mg via INTRAVENOUS
  Filled 2016-07-12: qty 25

## 2016-07-12 MED ORDER — OXALIPLATIN CHEMO INJECTION 100 MG/20ML
65.0000 mg/m2 | Freq: Once | INTRAVENOUS | Status: AC
  Administered 2016-07-12: 130 mg via INTRAVENOUS
  Filled 2016-07-12: qty 26

## 2016-07-12 MED ORDER — SODIUM CHLORIDE 0.9% FLUSH
10.0000 mL | INTRAVENOUS | Status: DC | PRN
Start: 2016-07-12 — End: 2016-07-12

## 2016-07-12 MED ORDER — PALONOSETRON HCL INJECTION 0.25 MG/5ML
0.2500 mg | Freq: Once | INTRAVENOUS | Status: AC
Start: 2016-07-12 — End: 2016-07-12
  Administered 2016-07-12: 0.25 mg via INTRAVENOUS
  Filled 2016-07-12: qty 5

## 2016-07-12 MED ORDER — SODIUM CHLORIDE 0.9 % IV SOLN
1680.0000 mg/m2 | INTRAVENOUS | Status: DC
  Administered 2016-07-12: 3400 mg via INTRAVENOUS
  Filled 2016-07-12: qty 68

## 2016-07-12 MED ORDER — DEXTROSE 5 % IV SOLN
Freq: Once | INTRAVENOUS | Status: AC
  Administered 2016-07-12: 10:00:00 via INTRAVENOUS

## 2016-07-12 MED ORDER — SODIUM CHLORIDE 0.9 % IV SOLN
20.0000 mg | Freq: Once | INTRAVENOUS | Status: AC
  Administered 2016-07-12: 20 mg via INTRAVENOUS
  Filled 2016-07-12: qty 2

## 2016-07-12 MED ORDER — INSULIN ASPART 100 UNIT/ML ~~LOC~~ SOLN
6.0000 [IU] | Freq: Once | SUBCUTANEOUS | Status: AC
  Administered 2016-07-12: 6 [IU] via SUBCUTANEOUS
  Filled 2016-07-12: qty 0.06

## 2016-07-12 NOTE — Progress Notes (Signed)
Follow up with high risk patient  Contacted Pt by visiting during his infusion  Wt Readings from Last 10 Encounters:  07/12/16 171 lb 3.2 oz (77.656 kg)  06/30/16 168 lb (76.204 kg)  06/28/16 165 lb 6.4 oz (75.025 kg)  06/14/16 168 lb 12.8 oz (76.567 kg)  06/06/16 177 lb 9.6 oz (80.559 kg)  05/31/16 182 lb (82.555 kg)  05/29/16 170 lb (77.111 kg)  05/19/16 167 lb (75.751 kg)  05/16/16 169 lb 12.8 oz (77.021 kg)  05/03/16 170 lb 4.8 oz (77.248 kg)   Patient weight has increased by 3 lbs. However, his weight is a poor indicator of nutrition status given his abdominal swelling. Today, he does report that his swelling is no different.   Overall he says that he is doing well. He states the he feels better overall. He says he is eating 3 meals each day, with snacks in between each one. He does not like the Ensure that was provided. "Id rather just drink milk". RD went over how to make homemade ensure from condensed milk and whole milk. He can flavor it with items such as chocolate syrup. He may prefer this over Ensure. He says he does like Boost. RD provided coupons for these .   He has acute issues of nausea, but nothing prolonged. Denies Constipation or vomiting. He does report diarrhea. When asked how many times a day he goes to bathroom, he says "too numerous to count". When asked what color he states it varies from brown to "lime" green. A stool sample was taken today. He denies any provoking or palliating factors; does not worsen with PO intake, nor is there a time of the day it is typically worse.   RD went through dietary recall. The foods he are choosing are good for the most part. He has a high protein source at most meals. His snacks could use improvement as they are largely just carbs such as chips or cookies. When asked if he has been adding "extras" to foods, he says "yes" and gave example of adding syrup and butter to his pancakes each day.   Provided the cause is non infectious, RD  went over how to combat diarrhea with his diet. RD reccommended eating starches and pectin containing foods, avoiding insoluble fiber containing /gas forming foods and limiting high simple sugar beverages. He does say he has been drinking a lot of soda. He does endorse symptoms of dehydration such as dizziness and light headedness. RD reccommended consuming good hydrating beverages such as milk or an ORS. RD explained how to make ORS with salt and G2.   Summary: Nutrition wise, pt appears to be doing well, he says he feels improved overall.  He is eating well w/ minimal side effects. He is choosing correct foods for the most part.  RD provided education on making fortified milk, hydration and dietary management for diarrhea.   RD will continue to monitor this high risk patient and will f/u at next chemo session if able.   Burtis Junes RD, LDN, [facility] Pager: [phone] 07/12/2016 10:28 AM

## 2016-07-12 NOTE — Progress Notes (Signed)
1225:  Tolerated tx w/o adverse reaction.  A&Ox4, in no distress.  VSS .  Discharged ambulatory.

## 2016-07-12 NOTE — Patient Instructions (Signed)
[facility] Discharge Instructions for Patients Receiving Chemotherapy   Beginning January 23rd 2017 lab work for the [facility] will be done in the  Main lab at [facility] on 1st floor. If you have a lab appointment with the [place] please come in thru the  Main Entrance and check in at the main information desk   Today you received the following chemotherapy agents:  Oxaliplatin, leucovorin, and 27f  If you develop nausea and vomiting, or diarrhea that is not controlled by your medication, call the clinic.  The clinic phone number is [phone] Office hours are Monday-Friday 8:30am-5:00pm.  BELOW ARE SYMPTOMS THAT SHOULD BE REPORTED IMMEDIATELY:  *FEVER GREATER THAN 101.0 F  *CHILLS WITH OR WITHOUT FEVER  NAUSEA AND VOMITING THAT IS NOT CONTROLLED WITH YOUR NAUSEA MEDICATION  *UNUSUAL SHORTNESS OF BREATH  *UNUSUAL BRUISING OR BLEEDING  TENDERNESS IN MOUTH AND THROAT WITH OR WITHOUT PRESENCE OF ULCERS  *URINARY PROBLEMS  *BOWEL PROBLEMS  UNUSUAL RASH Items with * indicate a potential emergency and should be followed up as soon as possible. If you have an emergency after office hours please contact your primary care physician or go to the nearest emergency department.  Please call the clinic during office hours if you have any questions or concerns.   You may also contact the Patient Navigator at [phone] you have any questions or need assistance in obtaining follow up care.      Resources For Cancer Patients and their Caregivers ? American Cancer Society: Can assist with transportation, wigs, general needs, runs Look Good Feel Better.        1726-345-9109? Cancer Care: Provides financial assistance, online support groups, medication/co-pay assistance.  1-800-813-HOPE (210-747-2604 ? [employer] [place] cancer patients and their families through emotional , educational and financial support.   3313-754-6020? [place] DSS Where to apply for food stamps, Medicaid and utility assistance. 3715 623 2621? RCATS: Transportation to medical appointments. 3(443)031-1960? Social Security Administration: May apply for disability if have a Stage IV cancer. [phone]? [employer] Disability and Transit Services: Assists with nutrition, care and transit needs. [phone]

## 2016-07-12 NOTE — Progress Notes (Signed)
[facility]  Progress Note  Patient Care Team: Jearld Fenton, NP as PCP - General (Internal Medicine)  CHIEF COMPLAINTS:    Esophageal cancer ([place])   05/04/2016 Miscellaneous ED Visit secondary to bilateral ankle swelling   05/09/2016 Imaging Enlarged liver with innumerable hepatic hypodense lesions most c/w metastatic disease. Diffuse mesenteric edema as well as multiple enlarged mesenteric and retroperitoneal LN   05/15/2016 Imaging Two posterior RUL nodules, indeterminate, 17 mm short axis RL paraesophageal node worrisome for nodal metastasis. Site of primary remains indeterminate   05/16/2016 Initial Biopsy Ultrasound guided biopsy of R sided liver lesion   05/16/2016 Pathology Results Metastatic poorly differentiated carcinoma, immunoprofile not entirely specific. Favor poorly differentiated squamous cell carcinoma,    05/25/2016 Procedure EGD with Dr. Gala Romney with esophageal varices, area of punch out ulceration that appears to be neoplastic   05/25/2016 Pathology Results The neoplasm stained positive for CK5/6, p63 (squamous cell markers), MOC-31, CK7 (patchy). CDX-2 (weak), TTF-1 (non specific stain), and negative stains for CK20 and [place] A. The immunostain pattern and morphologyfavored poorly diff sq cell carcinoma esoph   05/29/2016 Procedure Port-A-Cath placed by interventional radiology   05/31/2016 -  Chemotherapy FOLFOX    HISTORY OF PRESENTING ILLNESS:  Joshua Greene 55 y.o. 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 He has significant liver metastases. He is on dose reduced FOLFOX and has done well but unfortunately has resumed drinking.  Joshua Greene is accompanied by his fiance. He is here for Cycle #4 FOLFOX.  Reports his pain is managed much better. This has allowed him to walk and gain strength. States he is eating. He sleeps off and on. He often gets up to eat.   Reports diarrhea, stating he does not go  too far from a bathroom. This diarrhea occurs, "all the time" and through the night too. He believes this diarrhea began with treatment. He typically goes about five times daily. He has not taken any imodium, asking "what's that?". States this diarrhea smells like normal stool and denies blood in stool.   He denies fever.  Admits he has been drinking though "Not a whole lot". His fiance says first he started off with one can every other day, however he started drinking a 40 oz every other day and has been increasing the amount over time. Her fiance would talk to him about it but "I'm tired". The patient states, "I'm an alcoholic" and says he has been drinking less. He admits "It should have stopped", later saying "It's getting better".    MEDICAL HISTORY:  Past Medical History  Diagnosis Date  . Hypertension   . History of stomach ulcers   . Chicken pox   . Alcohol abuse   . Chronic bronchitis ([place])   . Erectile dysfunction   . Esophageal cancer ([place]) 05/22/2016    SURGICAL HISTORY: Past Surgical History  Procedure Laterality Date  . Appendectomy  1978  . Hernia repair    . Esophagogastroduodenoscopy N/A 05/25/2016    Procedure: ESOPHAGOGASTRODUODENOSCOPY (EGD);  Surgeon: Daneil Dolin, MD;  Location: [facility];  Service: Endoscopy;  Laterality: N/A;    SOCIAL HISTORY: Social History   Social History  . Marital Status: Single    Spouse Name: N/A  . Number of Children: N/A  . Years of Education: N/A   Occupational History  . Not on file.   Social History Main Topics  . Smoking status: Never  Smoker   . Smokeless tobacco: Current User    Types: Chew     Comment: 5 years  . Alcohol Use: Yes     Comment: occ  . Drug Use: No  . Sexual Activity: Yes   Other Topics Concern  . Not on file   Social History Narrative   Fiance, engaged for 3 years and known each other their whole lives 1 son 1 grandchild Non smoker ETOH, a lot of beer. He has quit drinking for over a  month.  Chews tobacco. He works in the [employer] and has for 3-4 months  FAMILY HISTORY: Family History  Problem Relation Age of Onset  . Hypertension Mother   . Diabetes Mother   . Cancer Neg Hx   . Heart disease Neg Hx   . Stroke Neg Hx    Mother is still living at 69 yo Father is still living at 3 yo 3 sisters and 1 brother, healthy  ALLERGIES:  has No Known Allergies.  MEDICATIONS:  Current Outpatient Prescriptions  Medication Sig Dispense Refill  . albuterol (PROVENTIL HFA;VENTOLIN HFA) 108 (90 Base) MCG/ACT inhaler Inhale 2 puffs into the lungs every 6 (six) hours as needed for wheezing or shortness of breath. (Patient not taking: Reported on 07/12/2016) 1 Inhaler 2  . benzonatate (TESSALON PERLES) 100 MG capsule Take 2 capsules (200 mg total) by mouth 3 (three) times daily as needed for cough. (Patient not taking: Reported on 07/12/2016) 30 capsule 0  . dexamethasone (DECADRON) 4 MG tablet Take 1 tablet (4 mg total) by mouth 3 (three) times daily. 90 tablet 0  . dextromethorphan-guaiFENesin (MUCINEX DM) 30-600 MG 12hr tablet Take 1 tablet by mouth 2 (two) times daily. 14 tablet 1  . disulfiram (ANTABUSE) 250 MG tablet Take 1 tablet (250 mg total) by mouth daily. 30 tablet 0  . Disulfiram 500 MG TABS Take 1 tablet (500 mg total) by mouth daily. 14 each 0  . fentaNYL (DURAGESIC - DOSED MCG/HR) 12 MCG/HR Place 1 patch (12.5 mcg total) onto the skin every 3 (three) days. 10 patch 0  . fentaNYL (DURAGESIC - DOSED MCG/HR) 25 MCG/HR patch Place 1 patch (25 mcg total) onto the skin every 3 (three) days. 10 patch 0  . fluorouracil CALGB 98119 in sodium chloride 0.9 % 150 mL Inject into the vein. To start 06/05/16. To be given every 14 days. To infuse over 46 hours    . furosemide (LASIX) 20 MG tablet Take 20 mg by mouth daily.    Marland Kitchen leucovorin 50 MG injection Inject into the vein daily. To start 06/05/16. To be given every 14 days.    Marland Kitchen lidocaine-prilocaine (EMLA) cream  Apply a quarter size amount to port site 1 hour prior to chemo. Do not rub in. Cover with plastic wrap. 30 g 3  . loperamide (IMODIUM A-D) 2 MG tablet Take 1 tablet (2 mg total) by mouth 4 (four) times daily as needed for diarrhea or loose stools. 30 tablet 0  . morphine (MSIR) 15 MG tablet Take 1 tablet (15 mg total) by mouth every 6 (six) hours as needed for severe pain. 60 tablet 0  . ondansetron (ZOFRAN) 8 MG tablet Take 1 tablet (8 mg total) by mouth every 8 (eight) hours as needed for nausea or vomiting. 30 tablet 2  . OXALIPLATIN IV Inject into the vein. To start 06/05/16. To be given every 14 days.    Marland Kitchen oxycodone (OXY-IR) 5 MG capsule Take 5  mg by mouth every 4 (four) hours as needed for pain.    . potassium chloride SA (K-DUR,KLOR-CON) 20 MEQ tablet Take 1 tablet (20 mEq total) by mouth daily. 30 tablet 1  . prochlorperazine (COMPAZINE) 10 MG tablet Take 1 tablet (10 mg total) by mouth every 6 (six) hours as needed for nausea or vomiting. 30 tablet 2  . sildenafil (REVATIO) 20 MG tablet Take 2-5 tablets as needed for sexual activity 50 tablet 0   No current facility-administered medications for this visit.    Review of Systems  Constitutional: Positive for malaise/fatigue.  HENT: Negative.   Eyes: Negative.   Respiratory: Negative.   Gastrointestinal: Positive for abdominal pain and diarrhea.       Abdominal swelling. Abdominal pain improved. Frequent diarrhea secondary to treatment  Genitourinary: Negative.   Musculoskeletal: Negative.   Skin: Negative.   Neurological: Positive for weakness. Negative for speech change.  Endo/Heme/Allergies: Negative.   Psychiatric/Behavioral: The patient has insomnia.   All other systems reviewed and are negative.  14 point ROS was done and is otherwise as detailed above or in HPI   PHYSICAL EXAMINATION: ECOG PERFORMANCE STATUS: 2 - Symptomatic, <50% confined to bed  Filed Vitals:   07/12/16 0817  BP: 125/73  Pulse: 98  Temp: 98.2 F  (36.8 C)  Resp: 18   Filed Weights   07/12/16 0817  Weight: 171 lb 3.2 oz (77.656 kg)   Physical Exam  Constitutional: He is oriented to person, place, and time.  Cachectic. Marland Kitchen  HENT:  Head: Normocephalic and atraumatic.  Mouth/Throat: Oropharynx is clear and moist. No oropharyngeal exudate.  Eyes: Conjunctivae and EOM are normal. Pupils are equal, round, and reactive to light. Right eye exhibits no discharge. Left eye exhibits no discharge.  Neck: Normal range of motion. Neck supple. No tracheal deviation present. No thyromegaly present.  Palpable left submandibular node about 1.5 cm in size  Cardiovascular: Normal rate, regular rhythm and normal heart sounds.   No murmur heard. Pulmonary/Chest: Effort normal and breath sounds normal. No respiratory distress. He has no wheezes. He has no rales. He exhibits no tenderness.  Abdominal: Soft. Bowel sounds are normal. He exhibits distension. There is no tenderness. There is no rebound.  Hepatomegaly, abdomen however softer  Musculoskeletal: Normal range of motion. He exhibits edema.  Markedly improved   Lymphadenopathy:    He has cervical adenopathy.  Neurological: He is alert and oriented to person, place, and time. No cranial nerve deficit. Gait normal. Coordination normal.  Skin: Skin is warm and dry. No rash noted. He is not diaphoretic. No erythema.  Psychiatric: Mood, memory, affect and judgment normal.  Nursing note and vitals reviewed.    LABORATORY DATA:  I have reviewed the data as listed  Results for FERRON, ISHMAEL (MRN [id number]) as of 07/12/2016 08:46  Ref. Range 06/28/2016 09:52  Sodium Latest Ref Range: 135-145 mmol/L 131 (L)  Potassium Latest Ref Range: 3.5-5.1 mmol/L 3.3 (L)  Chloride Latest Ref Range: 101-111 mmol/L 98 (L)  CO2 Latest Ref Range: 22-32 mmol/L 27  BUN Latest Ref Range: 6-20 mg/dL 9  Creatinine Latest Ref Range: 0.61-1.24 mg/dL 0.40 (L)  Calcium Latest Ref Range: 8.9-10.3 mg/dL 7.7 (L)  EGFR  (Non-African Amer.) Latest Ref Range: >60 mL/min >60  EGFR (African American) Latest Ref Range: >60 mL/min >60  Glucose Latest Ref Range: 65-99 mg/dL 101 (H)  Anion gap Latest Ref Range: 5-15  6  Alkaline Phosphatase Latest Ref Range: 38-126 U/L 245 (H)  Albumin Latest Ref Range: 3.5-5.0 g/dL 2.1 (L)  AST Latest Ref Range: 15-41 U/L 81 (H)  ALT Latest Ref Range: 17-63 U/L 59  Total Protein Latest Ref Range: 6.5-8.1 g/dL 6.2 (L)  Total Bilirubin Latest Ref Range: 0.3-1.2 mg/dL 2.0 (H)  WBC Latest Ref Range: 4.0-10.5 K/uL 7.6  RBC Latest Ref Range: 4.22-5.81 MIL/uL 4.19 (L)  Hemoglobin Latest Ref Range: 13.0-17.0 g/dL 12.7 (L)  HCT Latest Ref Range: 39.0-52.0 % 37.1 (L)  MCV Latest Ref Range: 78.0-100.0 fL 88.5  MCH Latest Ref Range: 26.0-34.0 pg 30.3  MCHC Latest Ref Range: 30.0-36.0 g/dL 34.2  RDW Latest Ref Range: 11.5-15.5 % 19.7 (H)  Platelets Latest Ref Range: 150-400 K/uL 135 (L)  Neutrophils Latest Units: % 43  Lymphocytes Latest Units: % 37  Monocytes Relative Latest Units: % 18  Eosinophil Latest Units: % 1  Basophil Latest Units: % 1  NEUT# Latest Ref Range: 1.7-7.7 K/uL 3.3  Lymphocyte # Latest Ref Range: 0.7-4.0 K/uL 2.8  Monocyte # Latest Ref Range: 0.1-1.0 K/uL 1.4 (H)  Eosinophils Absolute Latest Ref Range: 0.0-0.7 K/uL 0.1  Basophils Absolute Latest Ref Range: 0.0-0.1 K/uL 0.1  WBC Morphology Unknown ATYPICAL LYMPHOCYTES      RADIOGRAPHIC STUDIES: I have personally reviewed the radiological images as listed and agreed with the findings in the report. No results found. Study Result     CLINICAL DATA: Initial treatment strategy for pulmonary nodules and hepatic lesions.  EXAM: NUCLEAR MEDICINE PET SKULL BASE TO THIGH  TECHNIQUE: 9.1 mCi F-18 FDG was injected intravenously. Full-ring PET imaging was performed from the skull base to thigh after the radiotracer. CT data was obtained and used for attenuation correction and  anatomic localization.  FASTING BLOOD GLUCOSE: Value: 89 mg/dl  COMPARISON: 05/15/2016  FINDINGS: NECK  No hypermetabolic lymph nodes in the neck.  CHEST  There is hypermetabolic activity in esophagus several cm above the gastroesophageal junction with SUV max equal 8.7. There is adjacent hypermetabolic paraesophageal lymph node with SUV max equal 8.9.  There bilateral ground-glass nodules which are hypermetabolic. For example RIGHT upper lobe nodule measuring 16 mm with SUV max equal 2.7 (image 71, series 4). Several additional solid nodules within the lungs which are also hypermetabolic. For example LEFT lower lobe nodule measuring 7 mm on image 79 with SUV max 2.5.  There is hypermetabolic RIGHT hilar lymph nodes with SUV max equal 6.7.  ABDOMEN/PELVIS  There is confluent abnormal hypermetabolic activity with underlying nodularity throughout the liver consistent with widespread hepatic metastasis. Individual lesions multi measure measure. One region with intense activity in the RIGHT hepatic lobe with SUV max equal 9.0. Lesion in the LEFT hepatic lobe with SUV max equal 8.8.  There is haziness within the abdominal mesentery. No hypermetabolic abdominal pelvic lymph nodes.  Hypermetabolic prevascular lymph node in the precordial space with SUV max equal 7.6  SKELETON  Focal activity in the spinous process of the T1 vertebral body and inferior sternum with SUV max equal 4.4 concerning for skeletal metastasis  IMPRESSION: 1. Evidence of bilateral PULMONARY METASTASIS and widespread HEPATIC METASTASIS. 2. Focal metabolic activity in the distal esophagus is suggestive of PRIMARY ESOPHAGEAL CARCINOMA 3. Hypermetabolic lower paraesophageal metastatic lymph node. 4. Metastatic RIGHT hilar lymph node and precordial lymph node. 5. Evidence of skeletal metastasis in the spinous process of T1 and the inferior sternum.   Electronically Signed  By:  Suzy Bouchard M.D.  On: 05/26/2016 15:01     PATHOLOGY   ASSESSMENT & PLAN:  Widespread Metastatic carcinoma of esophageal primary  Liver metastases Cancer cachexia Cancer related Pain Hoarseness Tobacco use Alcohol use  He will proceed with cycle #4 today, I am not going to increase his dose of chemotherapy secondary to his diarrhea, I am not completely sure if chemotherapy related. I am sending stool cultures. Will start on low dose K supplement.  We discussed his alcohol use and he was advised to discontinue his alcohol use. We discussed a multitude of reasons why he needs to quit.   End of life issues, living will, HCPOA, code status were all addressed with the patient. I discussed that he has stage IV disease which is not curable.   The patient is here for Cycle #4 FOLFOX.  The patient was given an imodium care sheet.  He does not need any refills at this time.   He will return for follow up with his next cycle of treatment.  All questions were answered. The patient knows to call the clinic with any problems, questions or concerns.  This document serves as a record of services personally performed by Ancil Linsey, MD. It was created on her behalf by Arlyce Harman, a trained medical scribe. The creation of this record is based on the scribe's personal observations and the provider's statements to them. This document has been checked and approved by the attending provider.  I have reviewed the above documentation for accuracy and completeness, and I agree with the above.  This note was electronically signed.    Molli Hazard, MD  07/12/2016 8:46 AM

## 2016-07-12 NOTE — Patient Instructions (Addendum)
[place] at [facility] Discharge Instructions  RECOMMENDATIONS MADE BY THE CONSULTANT AND ANY TEST RESULTS WILL BE SENT TO YOUR REFERRING PHYSICIAN.  Exam done and seen today by Dr. Whitney Muse Get a fasting lab draw tomorrow morning Return to see the doctor in two weeks with chemo and labs Please call the clinic if you have any questions or concerns  Thank you for choosing [place] at [facility] to provide your oncology and hematology care.  To afford each patient quality time with our provider, please arrive at least 15 minutes before your scheduled appointment time.   Beginning January 23rd 2017 lab work for the [facility] will be done in the  Main lab at [facility] on 1st floor. If you have a lab appointment with the [facility] please come in thru the  Main Entrance and check in at the main information desk  You need to re-schedule your appointment should you arrive 10 or more minutes late.  We strive to give you quality time with our providers, and arriving late affects you and other patients whose appointments are after yours.  Also, if you no show three or more times for appointments you may be dismissed from the clinic at the providers discretion.     Again, thank you for choosing [facility].  Our hope is that these requests will decrease the amount of time that you wait before being seen by our physicians.       _____________________________________________________________  Should you have questions after your visit to [facility], please contact our office at [phone] between the hours of 8:30 a.m. and 4:30 p.m.  Voicemails left after 4:30 p.m. will not be returned until the following business day.  For prescription refill requests, have your pharmacy contact our office.         Resources For Cancer Patients and their Caregivers ? American Cancer Society: Can assist with transportation,  wigs, general needs, runs Look Good Feel Better.        [phone] ? Cancer Care: Provides financial assistance, online support groups, medication/co-pay assistance.  1-800-813-HOPE 570-237-7153) ? [place] Assists [place] cancer patients and their families through emotional , educational and financial support.  360-532-2930 ? [place] DSS Where to apply for food stamps, Medicaid and utility assistance. 463-092-0617 ? [employer]ATS: Transportation to medical appointments. (715)355-6622 ? Social Security Administration: May apply for disability if have a Stage IV cancer. [phone] 989-866-7230 ? [employer], Disability and Transit Services: Assists with nutrition, care and transit needs. [place] Support Programs: '@10RELATIVEDAYS'$ @ > Cancer Support Group  2nd Tuesday of the month 1pm-2pm, Journey Room  > Creative Journey  3rd Tuesday of the month 1130am-1pm, Journey Room  > Look Good Feel Better  1st Wednesday of the month 10am-12 noon, Journey Room (Call [facility] to register [phone])

## 2016-07-13 ENCOUNTER — Encounter (HOSPITAL_COMMUNITY): Payer: 59

## 2016-07-13 ENCOUNTER — Other Ambulatory Visit (HOSPITAL_COMMUNITY): Payer: Self-pay | Admitting: *Deleted

## 2016-07-13 DIAGNOSIS — Z720 Tobacco use: Secondary | ICD-10-CM | POA: Diagnosis not present

## 2016-07-13 DIAGNOSIS — A049 Bacterial intestinal infection, unspecified: Secondary | ICD-10-CM

## 2016-07-13 DIAGNOSIS — C155 Malignant neoplasm of lower third of esophagus: Secondary | ICD-10-CM

## 2016-07-13 DIAGNOSIS — R739 Hyperglycemia, unspecified: Secondary | ICD-10-CM

## 2016-07-13 DIAGNOSIS — J42 Unspecified chronic bronchitis: Secondary | ICD-10-CM | POA: Diagnosis not present

## 2016-07-13 DIAGNOSIS — C787 Secondary malignant neoplasm of liver and intrahepatic bile duct: Secondary | ICD-10-CM | POA: Diagnosis not present

## 2016-07-13 DIAGNOSIS — R490 Dysphonia: Secondary | ICD-10-CM | POA: Diagnosis not present

## 2016-07-13 DIAGNOSIS — Z9889 Other specified postprocedural states: Secondary | ICD-10-CM | POA: Diagnosis not present

## 2016-07-13 DIAGNOSIS — I10 Essential (primary) hypertension: Secondary | ICD-10-CM | POA: Diagnosis not present

## 2016-07-13 DIAGNOSIS — G893 Neoplasm related pain (acute) (chronic): Secondary | ICD-10-CM | POA: Diagnosis not present

## 2016-07-13 DIAGNOSIS — Z8719 Personal history of other diseases of the digestive system: Secondary | ICD-10-CM | POA: Diagnosis not present

## 2016-07-13 DIAGNOSIS — N529 Male erectile dysfunction, unspecified: Secondary | ICD-10-CM | POA: Diagnosis not present

## 2016-07-13 LAB — GLUCOSE, RANDOM: Glucose, Bld: 180 mg/dL — ABNORMAL HIGH (ref 65–99)

## 2016-07-13 MED ORDER — FUROSEMIDE 20 MG PO TABS
20.0000 mg | ORAL_TABLET | Freq: Every day | ORAL | Status: AC
Start: 2016-07-13 — End: ?

## 2016-07-13 MED ORDER — CIPROFLOXACIN HCL 500 MG PO TABS: 500.0000 mg | ORAL_TABLET | Freq: Two times a day (BID) | ORAL | Status: DC

## 2016-07-14 ENCOUNTER — Encounter (HOSPITAL_BASED_OUTPATIENT_CLINIC_OR_DEPARTMENT_OTHER): Payer: 59

## 2016-07-14 VITALS — BP 119/77 | HR 114 | Temp 98.4°F | Resp 18

## 2016-07-14 DIAGNOSIS — Z452 Encounter for adjustment and management of vascular access device: Secondary | ICD-10-CM

## 2016-07-14 DIAGNOSIS — C787 Secondary malignant neoplasm of liver and intrahepatic bile duct: Secondary | ICD-10-CM | POA: Diagnosis not present

## 2016-07-14 DIAGNOSIS — C159 Malignant neoplasm of esophagus, unspecified: Secondary | ICD-10-CM

## 2016-07-14 DIAGNOSIS — C155 Malignant neoplasm of lower third of esophagus: Secondary | ICD-10-CM

## 2016-07-14 LAB — FECAL LACTOFERRIN, QUANT: Lactoferrin, Fecal, Quant.: 2.91 ug/mL(g) (ref 0.00–7.24)

## 2016-07-14 MED ORDER — SODIUM CHLORIDE 0.9% FLUSH
10.0000 mL | INTRAVENOUS | Status: DC | PRN
  Administered 2016-07-14: 10 mL
  Filled 2016-07-14: qty 10

## 2016-07-14 MED ORDER — HEPARIN SOD (PORK) LOCK FLUSH 100 UNIT/ML IV SOLN
500.0000 [IU] | Freq: Once | INTRAVENOUS | Status: AC | PRN
  Administered 2016-07-14: 500 [IU]

## 2016-07-14 MED ORDER — HEPARIN SOD (PORK) LOCK FLUSH 100 UNIT/ML IV SOLN
INTRAVENOUS | Status: AC
  Filled 2016-07-14: qty 5

## 2016-07-14 NOTE — Patient Instructions (Signed)
[place] at [facility] Discharge Instructions  RECOMMENDATIONS MADE BY THE CONSULTANT AND ANY TEST RESULTS WILL BE SENT TO YOUR REFERRING PHYSICIAN.  Removed continuous infusion pump. Return as scheduled.  Thank you for choosing [place] at [facility] to provide your oncology and hematology care.  To afford each patient quality time with our provider, please arrive at least 15 minutes before your scheduled appointment time.   Beginning January 23rd 2017 lab work for the [facility] will be done in the  Main lab at [facility] on 1st floor. If you have a lab appointment with the [facility] please come in thru the  Main Entrance and check in at the main information desk  You need to re-schedule your appointment should you arrive 10 or more minutes late.  We strive to give you quality time with our providers, and arriving late affects you and other patients whose appointments are after yours.  Also, if you no show three or more times for appointments you may be dismissed from the clinic at the providers discretion.     Again, thank you for choosing [facility].  Our hope is that these requests will decrease the amount of time that you wait before being seen by our physicians.       _____________________________________________________________  Should you have questions after your visit to [facility], please contact our office at [phone] between the hours of 8:30 a.m. and 4:30 p.m.  Voicemails left after 4:30 p.m. will not be returned until the following business day.  For prescription refill requests, have your pharmacy contact our office.         Resources For Cancer Patients and their Caregivers ? American Cancer Society: Can assist with transportation, wigs, general needs, runs Look Good Feel Better.        ([phone] ? Cancer Care: Provides financial assistance, online support groups,  medication/co-pay assistance.  1-800-813-HOPE 5672561343) ? [place] Assists [place] cancer patients and their families through emotional , educational and financial support.  775-593-7958 ? [place] DSS Where to apply for food stamps, Medicaid and utility assistance. ([phone] 148-2194 ? RCATS: Transportation to medical appointments. 763-382-6853 ? Social Security Administration: May apply for disability if have a Stage IV cancer. [phone] 231-373-6094 ? [employer], Disability and Transit Services: Assists with nutrition, care and transit needs. [place] Support Programs: '@10RELATIVEDAYS'$ @ > Cancer Support Group  2nd Tuesday of the month 1pm-2pm, Journey Room  > Creative Journey  3rd Tuesday of the month 1130am-1pm, Journey Room  > Look Good Feel Better  1st Wednesday of the month 10am-12 noon, Journey Room (Call [place] to register [phone])

## 2016-07-14 NOTE — Progress Notes (Signed)
Continuous infusion pump completed on arrival. Disconnected pump from patient.  Joshua Greene presented for Portacath access and flush. Proper placement of portacath confirmed by CXR. Portacath located right chest wall. Portacath flushed with 11m NS and 500U/562mHeparin and needle removed intact. Procedure without incident. Patient tolerated procedure well.  Denies complaints post chemo. Ambulatory on discharge home with wife.

## 2016-07-15 ENCOUNTER — Telehealth (HOSPITAL_COMMUNITY): Payer: Self-pay | Admitting: Oncology

## 2016-07-15 NOTE — Telephone Encounter (Signed)
FAXED DISABILITY FORMS TO [employer]

## 2016-07-17 ENCOUNTER — Inpatient Hospital Stay (HOSPITAL_COMMUNITY): Payer: 59

## 2016-07-17 ENCOUNTER — Ambulatory Visit (HOSPITAL_COMMUNITY): Payer: 59 | Admitting: Oncology

## 2016-07-19 ENCOUNTER — Encounter (HOSPITAL_COMMUNITY): Payer: 59

## 2016-07-20 ENCOUNTER — Other Ambulatory Visit (HOSPITAL_COMMUNITY): Payer: Self-pay | Admitting: *Deleted

## 2016-07-20 DIAGNOSIS — R739 Hyperglycemia, unspecified: Secondary | ICD-10-CM

## 2016-07-20 DIAGNOSIS — C159 Malignant neoplasm of esophagus, unspecified: Secondary | ICD-10-CM | POA: Diagnosis not present

## 2016-07-20 DIAGNOSIS — C787 Secondary malignant neoplasm of liver and intrahepatic bile duct: Secondary | ICD-10-CM | POA: Diagnosis not present

## 2016-07-24 ENCOUNTER — Encounter (HOSPITAL_COMMUNITY): Payer: 59

## 2016-07-24 ENCOUNTER — Inpatient Hospital Stay (HOSPITAL_COMMUNITY): Payer: 59

## 2016-07-24 DIAGNOSIS — G893 Neoplasm related pain (acute) (chronic): Secondary | ICD-10-CM | POA: Diagnosis not present

## 2016-07-24 DIAGNOSIS — Z720 Tobacco use: Secondary | ICD-10-CM | POA: Diagnosis not present

## 2016-07-24 DIAGNOSIS — Z8719 Personal history of other diseases of the digestive system: Secondary | ICD-10-CM | POA: Diagnosis not present

## 2016-07-24 DIAGNOSIS — N529 Male erectile dysfunction, unspecified: Secondary | ICD-10-CM | POA: Diagnosis not present

## 2016-07-24 DIAGNOSIS — C787 Secondary malignant neoplasm of liver and intrahepatic bile duct: Secondary | ICD-10-CM | POA: Diagnosis not present

## 2016-07-24 DIAGNOSIS — R490 Dysphonia: Secondary | ICD-10-CM | POA: Diagnosis not present

## 2016-07-24 DIAGNOSIS — Z9889 Other specified postprocedural states: Secondary | ICD-10-CM | POA: Diagnosis not present

## 2016-07-24 DIAGNOSIS — R739 Hyperglycemia, unspecified: Secondary | ICD-10-CM

## 2016-07-24 DIAGNOSIS — J42 Unspecified chronic bronchitis: Secondary | ICD-10-CM | POA: Diagnosis not present

## 2016-07-24 DIAGNOSIS — I10 Essential (primary) hypertension: Secondary | ICD-10-CM | POA: Diagnosis not present

## 2016-07-24 LAB — GLUCOSE, RANDOM: Glucose, Bld: 163 mg/dL — ABNORMAL HIGH (ref 65–99)

## 2016-07-26 ENCOUNTER — Encounter (HOSPITAL_COMMUNITY): Payer: 59 | Attending: Hematology & Oncology | Admitting: Oncology

## 2016-07-26 ENCOUNTER — Encounter: Payer: Self-pay | Admitting: Dietician

## 2016-07-26 ENCOUNTER — Encounter (HOSPITAL_COMMUNITY): Payer: Self-pay | Admitting: Oncology

## 2016-07-26 ENCOUNTER — Encounter (HOSPITAL_BASED_OUTPATIENT_CLINIC_OR_DEPARTMENT_OTHER): Payer: 59

## 2016-07-26 VITALS — BP 110/94 | HR 87 | Temp 98.3°F | Resp 18

## 2016-07-26 DIAGNOSIS — J42 Unspecified chronic bronchitis: Secondary | ICD-10-CM | POA: Diagnosis not present

## 2016-07-26 DIAGNOSIS — C159 Malignant neoplasm of esophagus, unspecified: Secondary | ICD-10-CM | POA: Diagnosis not present

## 2016-07-26 DIAGNOSIS — R64 Cachexia: Secondary | ICD-10-CM | POA: Insufficient documentation

## 2016-07-26 DIAGNOSIS — Z9889 Other specified postprocedural states: Secondary | ICD-10-CM | POA: Diagnosis not present

## 2016-07-26 DIAGNOSIS — Z720 Tobacco use: Secondary | ICD-10-CM | POA: Diagnosis not present

## 2016-07-26 DIAGNOSIS — Z8249 Family history of ischemic heart disease and other diseases of the circulatory system: Secondary | ICD-10-CM | POA: Insufficient documentation

## 2016-07-26 DIAGNOSIS — R490 Dysphonia: Secondary | ICD-10-CM | POA: Insufficient documentation

## 2016-07-26 DIAGNOSIS — Z809 Family history of malignant neoplasm, unspecified: Secondary | ICD-10-CM | POA: Insufficient documentation

## 2016-07-26 DIAGNOSIS — C155 Malignant neoplasm of lower third of esophagus: Secondary | ICD-10-CM

## 2016-07-26 DIAGNOSIS — Z5111 Encounter for antineoplastic chemotherapy: Secondary | ICD-10-CM | POA: Diagnosis present

## 2016-07-26 DIAGNOSIS — I10 Essential (primary) hypertension: Secondary | ICD-10-CM | POA: Insufficient documentation

## 2016-07-26 DIAGNOSIS — R05 Cough: Secondary | ICD-10-CM | POA: Diagnosis not present

## 2016-07-26 DIAGNOSIS — Z823 Family history of stroke: Secondary | ICD-10-CM | POA: Diagnosis not present

## 2016-07-26 DIAGNOSIS — Z833 Family history of diabetes mellitus: Secondary | ICD-10-CM | POA: Diagnosis not present

## 2016-07-26 DIAGNOSIS — C787 Secondary malignant neoplasm of liver and intrahepatic bile duct: Secondary | ICD-10-CM | POA: Diagnosis not present

## 2016-07-26 DIAGNOSIS — Z79899 Other long term (current) drug therapy: Secondary | ICD-10-CM | POA: Insufficient documentation

## 2016-07-26 DIAGNOSIS — G893 Neoplasm related pain (acute) (chronic): Secondary | ICD-10-CM | POA: Insufficient documentation

## 2016-07-26 DIAGNOSIS — N529 Male erectile dysfunction, unspecified: Secondary | ICD-10-CM | POA: Insufficient documentation

## 2016-07-26 DIAGNOSIS — Z8719 Personal history of other diseases of the digestive system: Secondary | ICD-10-CM | POA: Insufficient documentation

## 2016-07-26 DIAGNOSIS — E876 Hypokalemia: Secondary | ICD-10-CM

## 2016-07-26 LAB — COMPREHENSIVE METABOLIC PANEL
ALK PHOS: 243 U/L — AB (ref 38–126)
ALT: 37 U/L (ref 17–63)
ANION GAP: 6 (ref 5–15)
AST: 64 U/L — ABNORMAL HIGH (ref 15–41)
Albumin: 2.3 g/dL — ABNORMAL LOW (ref 3.5–5.0)
BILIRUBIN TOTAL: 1.2 mg/dL (ref 0.3–1.2)
BUN: 8 mg/dL (ref 6–20)
CALCIUM: 7.7 mg/dL — AB (ref 8.9–10.3)
CO2: 26 mmol/L (ref 22–32)
Chloride: 99 mmol/L — ABNORMAL LOW (ref 101–111)
Creatinine, Ser: 0.5 mg/dL — ABNORMAL LOW (ref 0.61–1.24)
Glucose, Bld: 221 mg/dL — ABNORMAL HIGH (ref 65–99)
Potassium: 2.8 mmol/L — ABNORMAL LOW (ref 3.5–5.1)
SODIUM: 131 mmol/L — AB (ref 135–145)
TOTAL PROTEIN: 6.7 g/dL (ref 6.5–8.1)

## 2016-07-26 LAB — CBC WITH DIFFERENTIAL/PLATELET
Basophils Absolute: 0 10*3/uL (ref 0.0–0.1)
Basophils Relative: 0 %
EOS ABS: 0 10*3/uL (ref 0.0–0.7)
Eosinophils Relative: 0 %
HEMATOCRIT: 34.7 % — AB (ref 39.0–52.0)
HEMOGLOBIN: 11.7 g/dL — AB (ref 13.0–17.0)
LYMPHS ABS: 1.1 10*3/uL (ref 0.7–4.0)
Lymphocytes Relative: 12 %
MCH: 30.8 pg (ref 26.0–34.0)
MCHC: 33.7 g/dL (ref 30.0–36.0)
MCV: 91.3 fL (ref 78.0–100.0)
MONOS PCT: 10 %
Monocytes Absolute: 0.9 10*3/uL (ref 0.1–1.0)
NEUTROS PCT: 78 %
Neutro Abs: 6.8 10*3/uL (ref 1.7–7.7)
Platelets: 176 10*3/uL (ref 150–400)
RBC: 3.8 MIL/uL — ABNORMAL LOW (ref 4.22–5.81)
RDW: 20.7 % — ABNORMAL HIGH (ref 11.5–15.5)
WBC: 8.9 10*3/uL (ref 4.0–10.5)

## 2016-07-26 MED ORDER — POTASSIUM CHLORIDE 10 MEQ/100ML IV SOLN
10.0000 meq | INTRAVENOUS | Status: AC
Start: 2016-07-26 — End: 2016-07-26
  Filled 2016-07-26 (×3): qty 100

## 2016-07-26 MED ORDER — OXALIPLATIN CHEMO INJECTION 100 MG/20ML
65.0000 mg/m2 | Freq: Once | INTRAVENOUS | Status: AC
  Administered 2016-07-26: 130 mg via INTRAVENOUS
  Filled 2016-07-26: qty 16

## 2016-07-26 MED ORDER — DEXAMETHASONE SODIUM PHOSPHATE 100 MG/10ML IJ SOLN
20.0000 mg | Freq: Once | INTRAMUSCULAR | Status: AC
  Administered 2016-07-26: 20 mg via INTRAVENOUS
  Filled 2016-07-26: qty 2

## 2016-07-26 MED ORDER — PALONOSETRON HCL INJECTION 0.25 MG/5ML
INTRAVENOUS | Status: AC
  Filled 2016-07-26: qty 5

## 2016-07-26 MED ORDER — LEUCOVORIN CALCIUM INJECTION 350 MG
400.0000 mg/m2 | Freq: Once | INTRAVENOUS | Status: AC
  Administered 2016-07-26: 804 mg via INTRAVENOUS
  Filled 2016-07-26: qty 40.2

## 2016-07-26 MED ORDER — SODIUM CHLORIDE 0.9 % IV SOLN
1680.0000 mg/m2 | INTRAVENOUS | Status: DC
  Administered 2016-07-26: 3400 mg via INTRAVENOUS
  Filled 2016-07-26: qty 33

## 2016-07-26 MED ORDER — POTASSIUM CHLORIDE CRYS ER 20 MEQ PO TBCR
20.0000 meq | EXTENDED_RELEASE_TABLET | Freq: Once | ORAL | Status: AC
  Administered 2016-07-26: 20 meq via ORAL
  Filled 2016-07-26: qty 1

## 2016-07-26 MED ORDER — DEXTROSE 5 % IV SOLN
Freq: Once | INTRAVENOUS | Status: AC
  Administered 2016-07-26: 11:00:00 via INTRAVENOUS

## 2016-07-26 MED ORDER — SODIUM CHLORIDE 0.9% FLUSH: 10.0000 mL | INTRAVENOUS | Status: DC | PRN

## 2016-07-26 MED ORDER — PALONOSETRON HCL INJECTION 0.25 MG/5ML
0.2500 mg | Freq: Once | INTRAVENOUS | Status: AC
  Administered 2016-07-26: 0.25 mg via INTRAVENOUS

## 2016-07-26 MED ORDER — POTASSIUM CHLORIDE 2 MEQ/ML IV SOLN
Freq: Once | INTRAVENOUS | Status: AC
  Administered 2016-07-26: 11:00:00 via INTRAVENOUS
  Filled 2016-07-26: qty 250

## 2016-07-26 MED ORDER — POTASSIUM CHLORIDE CRYS ER 20 MEQ PO TBCR: 60.0000 meq | EXTENDED_RELEASE_TABLET | Freq: Every day | ORAL | 1 refills | Status: DC

## 2016-07-26 NOTE — Patient Instructions (Addendum)
[facility] Discharge Instructions for Patients Receiving Chemotherapy   Beginning January 23rd 2017 lab work for the [facility] will be done in the  Main lab at [facility] on 1st floor. If you have a lab appointment with the [place] please come in thru the  Main Entrance and check in at the main information desk   Today you received the following chemotherapy agents: Oxaliplatin, leucovorin, and fluorouracil.   You were given IV potassium as well during your appointment today.   You also have a prescription for potassium that you need to get from your pharmacy.  Please take two tablets in the morning and two in the evening.  Potassium is important for heart and muscle function.   Tom has sent a prescription for something in to your pharmacy for sleep as well.      If you develop nausea and vomiting, or diarrhea that is not controlled by your medication, call the clinic.  The clinic phone number is [phone]. Office hours are Monday-Friday 8:30am-5:00pm.  BELOW ARE SYMPTOMS THAT SHOULD BE REPORTED IMMEDIATELY:  *FEVER GREATER THAN 101.0 F  *CHILLS WITH OR WITHOUT FEVER  NAUSEA AND VOMITING THAT IS NOT CONTROLLED WITH YOUR NAUSEA MEDICATION  *UNUSUAL SHORTNESS OF BREATH  *UNUSUAL BRUISING OR BLEEDING  TENDERNESS IN MOUTH AND THROAT WITH OR WITHOUT PRESENCE OF ULCERS  *URINARY PROBLEMS  *BOWEL PROBLEMS  UNUSUAL RASH Items with * indicate a potential emergency and should be followed up as soon as possible. If you have an emergency after office hours please contact your primary care physician or go to the nearest emergency department.  Please call the clinic during office hours if you have any questions or concerns.   You may also contact the Patient Navigator at [phone] should you have any questions or need assistance in obtaining follow up care.      Resources For Cancer Patients and their Caregivers ? American Cancer Society: Can  assist with transportation, wigs, general needs, runs Look Good Feel Better.        ([phone] ? Cancer Care: Provides financial assistance, online support groups, medication/co-pay assistance.  1-800-813-HOPE 727-711-7778) ? [place] Assists [place] cancer patients and their families through emotional , educational and financial support.  5136281204 ? [place] DSS Where to apply for food stamps, Medicaid and utility assistance. 8484592043 ? RCATS: Transportation to medical appointments. [phone] ? Social Security Administration: May apply for disability if have a Stage IV cancer. [phone] ? [employer], Disability and Transit Services: Assists with nutrition, care and transit needs. [phone]

## 2016-07-26 NOTE — Assessment & Plan Note (Addendum)
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  Began palliative systemic chemotherapy on 05/31/2016 with FOLFOX.  Oncotype ID was sent off and reported back as Sarcoma.  This was discussed in [employer]  and pathology is very confident, as are with regards to response with therapy, that we are not dealing with a sarcoma.  Oncology history updated.  Labs today: CBC diff, CMET.  I personally reviewed and went over laboratory results with the patient.  The results are noted within this dictation.  Labs satisfy treatment parameters.  Hypokalemia is noted and therefore, Kdur 60 mEq is escribed to his pharmacy daily.  Orders placed for K+ and Magnesium on Monday.  He continues to consume 1 beer per week.  I have encouraged non-alcoholic beer, but he notes that he does not like the taste.  EtOH abstinence is encouraged and advised.  "Can I chew?"  He is advised to avoid tobacco products as well.  "Is that a drug?"  He has a productive cough of clear sputum.  I have recommended OTC Delsym.  He is having issues with sleeping.  I have recommended OTC products such as Benadryl, Melatonin, etc.  If these options are not effective, I would not hesitate to try Restoril for sleep.  He discusses a decreased sex drive.  At this time, he does not want to try PDE5 inhibitor due to cost.  He does not have a primary care provider.  We will get him set-up for CT imaging following cycle #6 of FOLFOX.  Orders are placed for CT CAP with contrast.  Return in 2 weeks for follow-up and next cycle of chemotherapy.

## 2016-07-26 NOTE — Progress Notes (Signed)
Follow up  Contacted Pt by visiting him during chemo infusion  Wt Readings from Last 10 Encounters:  07/26/16 169 lb 12.8 oz (77 kg)  07/12/16 171 lb 3.2 oz (77.7 kg)  06/30/16 168 lb (76.2 kg)  06/28/16 165 lb 6.4 oz (75 kg)  05/29/16 170 lb (77.1 kg)  05/19/16 167 lb (75.8 kg)  05/16/16 169 lb 12.8 oz (77 kg)  05/03/16 170 lb 4.8 oz (77.2 kg)  03/25/16 165 lb (74.8 kg)  02/09/16 165 lb (74.8 kg)   Patient weight has seems to have stabilized.  Though his weight is not a good nutrition indicator due to extensive abdominal swelling. Pt says he believes his abdominal swelling is unchanged.   Patient reports oral intake as good. He is eating at least 3 meals a day.   Pt was suffering from diarrhea at last appointment. Workup identified a GI pathogen and he states he was given medication to a great effect. "Its much better than it was". Denies any issues with n/v/c. He will occasionally get these, but they have little overall affect on him. He does not a lot of gas/bloating, but this is unchanged and doesn't seem to be affected by what he eats.   RD went through his 3 meals to make sure he is consuming protein at each meal and adding extra calories when can. From his dietary recall, he is eating protein at each meal and making sure to add "extras" such as condiments, dressings, syrup, butter etc to his meals.   He states he is also feeling stronger and having more energy. He is largely back to eating regular meals and declined Ensure. Nutritionally, he continues to improve.   He does indicate he has a high level of stress relating to his finances. He was recently told he will not be able to go back to work and he will need to apply for long term disability. He is not quite sure to go about this.   RD will express this need to CSW who may be able to offer assistance.   Given pt's progress and apparent stability, do not feel he needs monitored as closely with a sceduled followed up. RD will  continue to periodically check up on patient remotely to identify if he needs a revisit.   Burtis Junes RD, LDN, [place] [facility] Pager: [phone] 07/26/2016 11:41 AM

## 2016-07-26 NOTE — Addendum Note (Signed)
Addended by: Baird Cancer on: 07/26/2016 06:50 PM   Modules accepted: Orders

## 2016-07-26 NOTE — Patient Instructions (Signed)
[place] at [facility] Discharge Instructions  RECOMMENDATIONS MADE BY THE CONSULTANT AND ANY TEST RESULTS WILL BE SENT TO YOUR REFERRING PHYSICIAN.  Exam done and seen today by [facility] today, treatment today pending labs Abstinance from ETOH ecouraged Tobacco abuse discouraged Recommended OTC sleep meds: unisom, benadryl, melatonin For cough we recommend over the counter delsym Return to see the Doctor in 2 weeks for follow up and treatment. Call the clinic for any concerns or questions.   Thank you for choosing [place] at [facility] to provide your oncology and hematology care.  To afford each patient quality time with our provider, please arrive at least 15 minutes before your scheduled appointment time.   Beginning January 23rd 2017 lab work for the [facility] will be done in the  Main lab at [facility] on 1st floor. If you have a lab appointment with the [place] please come in thru the  Main Entrance and check in at the main information desk  You need to re-schedule your appointment should you arrive 10 or more minutes late.  We strive to give you quality time with our providers, and arriving late affects you and other patients whose appointments are after yours.  Also, if you no show three or more times for appointments you may be dismissed from the clinic at the providers discretion.     Again, thank you for choosing [facility].  Our hope is that these requests will decrease the amount of time that you wait before being seen by our physicians.       _____________________________________________________________  Should you have questions after your visit to [facility], please contact our office at [phone] between the hours of 8:30 a.m. and 4:30 p.m.  Voicemails left after 4:30 p.m. will not be returned until the following business day.  For prescription refill requests, have your  pharmacy contact our office.         Resources For Cancer Patients and their Caregivers ? American Cancer Society: Can assist with transportation, wigs, general needs, runs Look Good Feel Better.        ([phone] ? Cancer Care: Provides financial assistance, online support groups, medication/co-pay assistance.  1-800-813-HOPE (309)074-3608) ? [place] Assists [place] cancer patients and their families through emotional , educational and financial support.  [phone] ? [place] DSS Where to apply for food stamps, Medicaid and utility assistance. 705-048-2790 ? RCATS: Transportation to medical appointments. 254-811-5997 ? Social Security Administration: May apply for disability if have a Stage IV cancer. [phone] [phone])[phone] ? [employer], Disability and Transit Services: Assists with nutrition, care and transit needs. [place] Support Programs: '@10RELATIVEDAYS'$ @ > Cancer Support Group  2nd Tuesday of the month 1pm-2pm, Journey Room  > Creative Journey  3rd Tuesday of the month 1130am-1pm, Journey Room  > Look Good Feel Better  1st Wednesday of the month 10am-12 noon, Journey Room (Call [place] to register [phone])

## 2016-07-26 NOTE — Progress Notes (Signed)
Patient tolerated infusion well.  VSS.   

## 2016-07-26 NOTE — Progress Notes (Signed)
Joshua Silversmith, NP [place] [address]  Malignant neoplasm of lower third of esophagus ([place])  Hypokalemia - Plan: potassium chloride SA (K-DUR,KLOR-CON) 20 MEQ tablet, Potassium, Magnesium  CURRENT THERAPY: FOLFOX beginning on 05/31/2016  INTERVAL HISTORY: Joshua Greene 55 y.o. 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    Esophageal cancer ([place])   05/04/2016 Miscellaneous    ED Visit secondary to bilateral ankle swelling     05/09/2016 Imaging    Enlarged liver with innumerable hepatic hypodense lesions most c/w metastatic disease. Diffuse mesenteric edema as well as multiple enlarged mesenteric and retroperitoneal LN     05/15/2016 Imaging    Two posterior RUL nodules, indeterminate, 17 mm short axis RL paraesophageal node worrisome for nodal metastasis. Site of primary remains indeterminate     05/16/2016 Initial Biopsy    Ultrasound guided biopsy of R sided liver lesion     05/16/2016 Pathology Results    Metastatic poorly differentiated carcinoma, immunoprofile not entirely specific. Favor poorly differentiated squamous cell carcinoma,      05/25/2016 Procedure    EGD with Dr. Gala Romney with esophageal varices, area of punch out ulceration that appears to be neoplastic     05/25/2016 Pathology Results    The neoplasm stained positive for CK5/6, p63 (squamous cell markers), MOC-31, CK7 (patchy). CDX-2 (weak), TTF-1 (non specific stain), and negative stains for CK20 and Nap A. The immunostain pattern and morphologyfavored poorly diff sq cell carcinoma esoph     05/29/2016 Procedure    Port-A-Cath placed by interventional radiology     05/31/2016 -  Chemotherapy    FOLFOX     07/12/2016 Miscellaneous    Dx with Enteropathogenic E coli.  Treated with Cipro x 10 days     He continues to drink alcohol. He quantifies the amount of alcohol use he is  consuming  is1 beer per week. He is provided education regarding alcohol consumption in the setting of chemotherapy and significant liver involvement of disease. He is advised to abstain from alcohol consumption.  He asks if he can chew tobacco. From medical standpoint, he is advised to avoid tobacco abuse. "Is that drug?"  He reports a decreased libido. We discussed options regarding this. It's likely secondary to malignancy and treatment in addition to other issues that may be ongoing in his relationship. At this time, he is not interested in PDE5 inhibitor therapy due to cost.  He reports a cough that is productive of clear sputum. No fevers or chills.  He is having issues with sleeping. He used to works second shift and I suspect this is playing a part in his insomnia.   Review of Systems  Constitutional: Negative for chills and fever.  HENT: Negative.  Negative for congestion.   Eyes: Negative.   Respiratory: Positive for cough. Negative for sputum production.   Cardiovascular: Negative.  Negative for chest pain.  Gastrointestinal: Negative for nausea and vomiting.       Abdominal distension, improved  Genitourinary: Negative.   Musculoskeletal: Negative.   Skin: Negative.   Neurological: Negative.  Negative for weakness.  Endo/Heme/Allergies: Negative.   Psychiatric/Behavioral: Negative.      Past Medical History:  Diagnosis Date  . Alcohol abuse   . Chicken pox   . Chronic bronchitis ([place])   . Erectile dysfunction   . Esophageal cancer ([place]) 05/22/2016  . History of stomach  ulcers   . Hypertension     Past Surgical History:  Procedure Laterality Date  . APPENDECTOMY  1978  . ESOPHAGOGASTRODUODENOSCOPY N/A 05/25/2016   Procedure: ESOPHAGOGASTRODUODENOSCOPY (EGD);  Surgeon: Daneil Dolin, MD;  Location: [facility];  Service: Endoscopy;  Laterality: N/A;  . HERNIA REPAIR      Family History  Problem Relation Age of [place]  . Hypertension Mother   . Diabetes Mother   . Cancer  Neg Hx   . Heart disease Neg Hx   . Stroke Neg Hx     Social History   Social History  . Marital status: Single    Spouse name: N/A  . Number of children: N/A  . Years of education: N/A   Social History Main Topics  . Smoking status: Never Smoker  . Smokeless tobacco: Current User    Types: Chew     Comment: 5 years  . Alcohol use Yes     Comment: occ  . Drug use: No  . Sexual activity: Yes   Other Topics Concern  . None   Social History Narrative  . None     PHYSICAL EXAMINATION  ECOG PERFORMANCE STATUS: 1 - Symptomatic but completely ambulatory  Vitals:   07/26/16 0949  BP: 123/80  Pulse: (!) 110  Resp: 18  Temp: 98.5 F (36.9 C)     GENERAL:alert, cachectic, smiling, appearing much improved.  His face is filling out.  Unaccompanied today.  SKIN: skin color, texture, turgor are normal, no rashes or significant lesions HEAD: Normocephalic EYES: Conjunctiva are pink and non-injected EARS: External ears normal OROPHARYNX:lips, buccal mucosa, and tongue normal and mucous membranes are moist  NECK: supple, trachea midline LYMPH:  not examined BREAST:not examined LUNGS: clear to auscultation and percussion HEART: regular rate & rhythm without murmur, rub, or gallop. ABDOMEN:abdomen soft, distended, normal bowel sounds and hepatomegaly noted. BACK: Back symmetric, no curvature. EXTREMITIES:less then 2 second capillary refill, no skin discoloration, no cyanosis NEURO: alert & oriented x 3 with fluent speech, no focal motor/sensory deficits, gait normal   LABORATORY DATA: CBC    Component Value Date/Time   WBC 8.9 07/26/2016 0950   RBC 3.80 (L) 07/26/2016 0950   HGB 11.7 (L) 07/26/2016 0950   HGB 15.1 12/03/2014 1205   HCT 34.7 (L) 07/26/2016 0950   HCT 45.7 12/03/2014 1205   PLT 176 07/26/2016 0950   PLT 262 12/03/2014 1205   MCV 91.3 07/26/2016 0950   MCV 96 12/03/2014 1205   MCH 30.8 07/26/2016 0950   MCHC 33.7 07/26/2016 0950   RDW 20.7 (H)  07/26/2016 0950   RDW 13.3 12/03/2014 1205   LYMPHSABS 1.1 07/26/2016 0950   LYMPHSABS 1.0 01/14/2014 2000   MONOABS 0.9 07/26/2016 0950   MONOABS 1.0 01/14/2014 2000   EOSABS 0.0 07/26/2016 0950   EOSABS 0.0 01/14/2014 2000   BASOSABS 0.0 07/26/2016 0950   BASOSABS 0.0 01/14/2014 2000      Chemistry      Component Value Date/Time   NA 131 (L) 07/26/2016 0950   NA 137 12/03/2014 1205   K 2.8 (L) 07/26/2016 0950   K 3.8 12/03/2014 1205   CL 99 (L) 07/26/2016 0950   CL 97 (L) 12/03/2014 1205   CO2 26 07/26/2016 0950   CO2 30 12/03/2014 1205   BUN 8 07/26/2016 0950   BUN 8 12/03/2014 1205   CREATININE 0.50 (L) 07/26/2016 0950   CREATININE 0.69 12/03/2014 1205      Component Value  Date/Time   CALCIUM 7.7 (L) 07/26/2016 0950   CALCIUM 9.1 12/03/2014 1205   ALKPHOS 243 (H) 07/26/2016 0950   ALKPHOS 71 12/03/2014 1205   AST 64 (H) 07/26/2016 0950   AST 51 (H) 12/03/2014 1205   ALT 37 07/26/2016 0950   ALT 42 12/03/2014 1205   BILITOT 1.2 07/26/2016 0950   BILITOT 0.9 12/03/2014 1205        PENDING LABS:   RADIOGRAPHIC STUDIES:  No results found.   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  Began palliative systemic chemotherapy on 05/31/2016 with FOLFOX.  Oncotype ID was sent off and reported back as Sarcoma.  This was discussed in [employer]  and pathology is very confident, as are with regards to response with therapy, that we are not dealing with a sarcoma.  Oncology history updated.  Labs today: CBC diff, CMET.  I personally reviewed and went over laboratory results with the patient.  The results are noted within this dictation.  Labs satisfy treatment parameters.  Hypokalemia is noted and therefore, Kdur 60 mEq is escribed to his pharmacy daily.  Orders placed for K+ and Magnesium on Monday.  He continues to consume 1 beer per  week.  I have encouraged non-alcoholic beer, but he notes that he does not like the taste.  EtOH abstinence is encouraged and advised.  "Can I chew?"  He is advised to avoid tobacco products as well.  "Is that a drug?"  He has a productive cough of clear sputum.  I have recommended OTC Delsym.  He is having issues with sleeping.  I have recommended OTC products such as Benadryl, Melatonin, etc.  If these options are not effective, I would not hesitate to try Restoril for sleep.  He discusses a decreased sex drive.  At this time, he does not want to try PDE5 inhibitor due to cost.  He does not have a primary care provider.  Return in 2 weeks for follow-up and next cycle of chemotherapy.   ORDERS PLACED FOR THIS ENCOUNTER: Orders Placed This Encounter  Procedures  . Potassium  . Magnesium    MEDICATIONS PRESCRIBED THIS ENCOUNTER: Meds ordered this encounter  Medications  . potassium chloride SA (K-DUR,KLOR-CON) 20 MEQ tablet    Sig: Take 3 tablets (60 mEq total) by mouth daily.    Dispense:  60 tablet    Refill:  1    Order Specific Question:   Supervising Provider    Answer:   Patrici Ranks [id number]    THERAPY PLAN:  Continue treatment as planned with supportive care.  All questions were answered. The patient knows to call the clinic with any problems, questions or concerns. We can certainly see the patient much sooner if necessary.  Patient and plan discussed with Dr. Ancil Linsey and she is in agreement with the aforementioned.   This note is electronically signed by: Doy Mince 07/26/2016 6:42 PM

## 2016-07-27 ENCOUNTER — Encounter: Payer: Self-pay | Admitting: *Deleted

## 2016-07-27 NOTE — Progress Notes (Signed)
[place] Clinical Social Work  Clinical Social Work was referred by dietician due to financial issues and need to apply for ss disability. CSW phoned pt at home and explained process and local resources to assist. Pt plans to pursue and reach out as needed. CSW encouraged pt to consider Duanne Limerick for asst and support as well as [facility] support programs. Pt agrees to call as needed.   Clinical Social Work interventions: Supportive listening  Resource education  Loren Racer, [place] Tuesdays   [phone]

## 2016-07-28 ENCOUNTER — Encounter (HOSPITAL_BASED_OUTPATIENT_CLINIC_OR_DEPARTMENT_OTHER): Payer: 59

## 2016-07-28 VITALS — BP 102/84 | HR 108 | Temp 98.3°F | Resp 18

## 2016-07-28 DIAGNOSIS — C787 Secondary malignant neoplasm of liver and intrahepatic bile duct: Secondary | ICD-10-CM

## 2016-07-28 DIAGNOSIS — C155 Malignant neoplasm of lower third of esophagus: Secondary | ICD-10-CM | POA: Diagnosis not present

## 2016-07-28 DIAGNOSIS — Z95828 Presence of other vascular implants and grafts: Secondary | ICD-10-CM

## 2016-07-28 DIAGNOSIS — Z452 Encounter for adjustment and management of vascular access device: Secondary | ICD-10-CM

## 2016-07-28 MED ORDER — HEPARIN SOD (PORK) LOCK FLUSH 100 UNIT/ML IV SOLN
500.0000 [IU] | Freq: Once | INTRAVENOUS | Status: AC
  Administered 2016-07-28: 500 [IU] via INTRAVENOUS

## 2016-07-28 MED ORDER — SODIUM CHLORIDE 0.9% FLUSH
10.0000 mL | INTRAVENOUS | Status: DC | PRN
  Administered 2016-07-28: 10 mL via INTRAVENOUS
  Filled 2016-07-28: qty 10

## 2016-07-28 MED ORDER — HEPARIN SOD (PORK) LOCK FLUSH 100 UNIT/ML IV SOLN
INTRAVENOUS | Status: AC
  Filled 2016-07-28: qty 5

## 2016-07-28 NOTE — Patient Instructions (Signed)
[place] at [facility] Discharge Instructions  RECOMMENDATIONS MADE BY THE CONSULTANT AND ANY TEST RESULTS WILL BE SENT TO YOUR REFERRING PHYSICIAN.  5FU pump discontinued today. Follow-up as scheduled. Call clinic for any questions or concerns.  Thank you for choosing [place] at [facility] to provide your oncology and hematology care.  To afford each patient quality time with our provider, please arrive at least 15 minutes before your scheduled appointment time.   Beginning January 23rd 2017 lab work for the [facility] will be done in the  Main lab at [facility] on 1st floor. If you have a lab appointment with the [place] please come in thru the  Main Entrance and check in at the main information desk  You need to re-schedule your appointment should you arrive 10 or more minutes late.  We strive to give you quality time with our providers, and arriving late affects you and other patients whose appointments are after yours.  Also, if you no show three or more times for appointments you may be dismissed from the clinic at the providers discretion.     Again, thank you for choosing [facility].  Our hope is that these requests will decrease the amount of time that you wait before being seen by our physicians.       _____________________________________________________________  Should you have questions after your visit to [facility], please contact our office at [phone] between the hours of 8:30 a.m. and 4:30 p.m.  Voicemails left after 4:30 p.m. will not be returned until the following business day.  For prescription refill requests, have your pharmacy contact our office.         Resources For Cancer Patients and their Caregivers ? American Cancer Society: Can assist with transportation, wigs, general needs, runs Look Good Feel Better.        8064891768 ? Cancer Care: Provides financial  assistance, online support groups, medication/co-pay assistance.  1-800-813-HOPE 740-041-9272) ? [place] Assists [place] cancer patients and their families through emotional , educational and financial support.  709-649-1770 ? [place] DSS Where to apply for food stamps, Medicaid and utility assistance. 973-427-6270 ? RCATS: Transportation to medical appointments. 321-874-5899 ? Social Security Administration: May apply for disability if have a Stage IV cancer. 778-425-8884 [phone] ? [employer], Disability and Transit Services: Assists with nutrition, care and transit needs. [place] Support Programs: '@10RELATIVEDAYS'$ @ > Cancer Support Group  2nd Tuesday of the month 1pm-2pm, Journey Room  > Creative Journey  3rd Tuesday of the month 1130am-1pm, Journey Room  > Look Good Feel Better  1st Wednesday of the month 10am-12 noon, Journey Room (Call [address] to register [phone])

## 2016-07-28 NOTE — Progress Notes (Signed)
Joshua Greene tolerated 5FU pump well without complaints. Pump discontinued today. Pt discharged self ambulatory in satisfactory condition

## 2016-07-31 ENCOUNTER — Other Ambulatory Visit (HOSPITAL_COMMUNITY): Payer: 59

## 2016-07-31 DIAGNOSIS — C787 Secondary malignant neoplasm of liver and intrahepatic bile duct: Secondary | ICD-10-CM | POA: Diagnosis not present

## 2016-07-31 DIAGNOSIS — C159 Malignant neoplasm of esophagus, unspecified: Secondary | ICD-10-CM | POA: Diagnosis not present

## 2016-08-09 ENCOUNTER — Encounter (HOSPITAL_BASED_OUTPATIENT_CLINIC_OR_DEPARTMENT_OTHER): Payer: 59

## 2016-08-09 ENCOUNTER — Encounter (HOSPITAL_BASED_OUTPATIENT_CLINIC_OR_DEPARTMENT_OTHER): Payer: 59 | Admitting: Oncology

## 2016-08-09 VITALS — BP 130/82 | HR 86 | Temp 98.3°F | Resp 18 | Wt 158.0 lb

## 2016-08-09 DIAGNOSIS — Z809 Family history of malignant neoplasm, unspecified: Secondary | ICD-10-CM | POA: Diagnosis not present

## 2016-08-09 DIAGNOSIS — Z9889 Other specified postprocedural states: Secondary | ICD-10-CM | POA: Diagnosis not present

## 2016-08-09 DIAGNOSIS — C787 Secondary malignant neoplasm of liver and intrahepatic bile duct: Secondary | ICD-10-CM

## 2016-08-09 DIAGNOSIS — C155 Malignant neoplasm of lower third of esophagus: Secondary | ICD-10-CM | POA: Diagnosis not present

## 2016-08-09 DIAGNOSIS — Z720 Tobacco use: Secondary | ICD-10-CM | POA: Diagnosis not present

## 2016-08-09 DIAGNOSIS — R739 Hyperglycemia, unspecified: Secondary | ICD-10-CM | POA: Diagnosis not present

## 2016-08-09 DIAGNOSIS — J42 Unspecified chronic bronchitis: Secondary | ICD-10-CM | POA: Diagnosis not present

## 2016-08-09 DIAGNOSIS — E876 Hypokalemia: Secondary | ICD-10-CM

## 2016-08-09 DIAGNOSIS — Z8719 Personal history of other diseases of the digestive system: Secondary | ICD-10-CM | POA: Diagnosis not present

## 2016-08-09 DIAGNOSIS — Z833 Family history of diabetes mellitus: Secondary | ICD-10-CM | POA: Diagnosis not present

## 2016-08-09 DIAGNOSIS — I10 Essential (primary) hypertension: Secondary | ICD-10-CM | POA: Diagnosis not present

## 2016-08-09 DIAGNOSIS — N529 Male erectile dysfunction, unspecified: Secondary | ICD-10-CM | POA: Diagnosis not present

## 2016-08-09 DIAGNOSIS — Z5111 Encounter for antineoplastic chemotherapy: Secondary | ICD-10-CM

## 2016-08-09 DIAGNOSIS — C159 Malignant neoplasm of esophagus, unspecified: Secondary | ICD-10-CM | POA: Diagnosis not present

## 2016-08-09 LAB — COMPREHENSIVE METABOLIC PANEL
ALBUMIN: 2.7 g/dL — AB (ref 3.5–5.0)
ALK PHOS: 283 U/L — AB (ref 38–126)
ALT: 63 U/L (ref 17–63)
AST: 63 U/L — AB (ref 15–41)
Anion gap: 9 (ref 5–15)
BILIRUBIN TOTAL: 2.9 mg/dL — AB (ref 0.3–1.2)
BUN: 14 mg/dL (ref 6–20)
CALCIUM: 8.4 mg/dL — AB (ref 8.9–10.3)
CO2: 27 mmol/L (ref 22–32)
CREATININE: 0.49 mg/dL — AB (ref 0.61–1.24)
Chloride: 94 mmol/L — ABNORMAL LOW (ref 101–111)
GFR calc Af Amer: >60 mL/min (ref >60)
GFR calc non Af Amer: >60 mL/min (ref >60)
GLUCOSE: 322 mg/dL — AB (ref 65–99)
Potassium: 4.1 mmol/L (ref 3.5–5.1)
SODIUM: 130 mmol/L — AB (ref 135–145)
TOTAL PROTEIN: 6.5 g/dL (ref 6.5–8.1)

## 2016-08-09 LAB — CBC WITH DIFFERENTIAL/PLATELET
BASOS PCT: 0 %
Basophils Absolute: 0 10*3/uL (ref 0.0–0.1)
EOS PCT: 0 %
Eosinophils Absolute: 0 10*3/uL (ref 0.0–0.7)
HEMATOCRIT: 37.7 % — AB (ref 39.0–52.0)
HEMOGLOBIN: 12.9 g/dL — AB (ref 13.0–17.0)
Lymphocytes Relative: 14 %
Lymphs Abs: 1.8 10*3/uL (ref 0.7–4.0)
MCH: 31.9 pg (ref 26.0–34.0)
MCHC: 34.2 g/dL (ref 30.0–36.0)
MCV: 93.1 fL (ref 78.0–100.0)
MONO ABS: 2.1 10*3/uL — AB (ref 0.1–1.0)
MONOS PCT: 16 %
NEUTROS PCT: 70 %
Neutro Abs: 9.1 10*3/uL — ABNORMAL HIGH (ref 1.7–7.7)
Platelets: 146 10*3/uL — ABNORMAL LOW (ref 150–400)
RBC: 4.05 MIL/uL — AB (ref 4.22–5.81)
RDW: 21.1 % — ABNORMAL HIGH (ref 11.5–15.5)
WBC: 13 10*3/uL — AB (ref 4.0–10.5)

## 2016-08-09 LAB — MAGNESIUM: Magnesium: 1.9 mg/dL (ref 1.7–2.4)

## 2016-08-09 LAB — GLUCOSE, RANDOM: Glucose, Bld: 380 mg/dL — ABNORMAL HIGH (ref 65–99)

## 2016-08-09 MED ORDER — MORPHINE SULFATE 15 MG PO TABS: 15.0000 mg | ORAL_TABLET | Freq: Four times a day (QID) | ORAL | 0 refills | Status: DC | PRN

## 2016-08-09 MED ORDER — PALONOSETRON HCL INJECTION 0.25 MG/5ML
INTRAVENOUS | Status: AC
  Filled 2016-08-09: qty 5

## 2016-08-09 MED ORDER — INSULIN ASPART 100 UNIT/ML ~~LOC~~ SOLN
8.0000 [IU] | Freq: Once | SUBCUTANEOUS | Status: AC
  Administered 2016-08-09: 8 [IU] via SUBCUTANEOUS
  Filled 2016-08-09: qty 0.08

## 2016-08-09 MED ORDER — DEXTROSE 5 % IV SOLN
Freq: Once | INTRAVENOUS | Status: AC
  Administered 2016-08-09: 13:00:00 via INTRAVENOUS

## 2016-08-09 MED ORDER — OXALIPLATIN CHEMO INJECTION 100 MG/20ML
65.0000 mg/m2 | Freq: Once | INTRAVENOUS | Status: AC
  Administered 2016-08-09: 130 mg via INTRAVENOUS
  Filled 2016-08-09: qty 26

## 2016-08-09 MED ORDER — FENTANYL 25 MCG/HR TD PT72: 25.0000 ug | MEDICATED_PATCH | TRANSDERMAL | 0 refills | Status: DC

## 2016-08-09 MED ORDER — SODIUM CHLORIDE 0.9% FLUSH: 10.0000 mL | INTRAVENOUS | Status: DC | PRN

## 2016-08-09 MED ORDER — LEUCOVORIN CALCIUM INJECTION 350 MG
400.0000 mg/m2 | Freq: Once | INTRAVENOUS | Status: AC
  Administered 2016-08-09: 804 mg via INTRAVENOUS
  Filled 2016-08-09: qty 40.2

## 2016-08-09 MED ORDER — PALONOSETRON HCL INJECTION 0.25 MG/5ML
0.2500 mg | Freq: Once | INTRAVENOUS | Status: AC
  Administered 2016-08-09: 0.25 mg via INTRAVENOUS

## 2016-08-09 MED ORDER — SODIUM CHLORIDE 0.9 % IV SOLN
1680.0000 mg/m2 | INTRAVENOUS | Status: DC
  Administered 2016-08-09: 3400 mg via INTRAVENOUS
  Filled 2016-08-09: qty 68

## 2016-08-09 MED ORDER — SODIUM CHLORIDE 0.9 % IV SOLN
20.0000 mg | Freq: Once | INTRAVENOUS | Status: AC
  Administered 2016-08-09: 20 mg via INTRAVENOUS
  Filled 2016-08-09: qty 2

## 2016-08-09 MED ORDER — FENTANYL 12 MCG/HR TD PT72: 12.5000 ug | MEDICATED_PATCH | TRANSDERMAL | 0 refills | Status: DC

## 2016-08-09 NOTE — Assessment & Plan Note (Addendum)
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  Began palliative systemic chemotherapy on 05/31/2016 with FOLFOX.  Oncotype ID was sent off and reported back as Sarcoma.  This was discussed in [employer]  and pathology is very confident, as are with regards to response with therapy, that we are not dealing with a sarcoma.  Oncology history updated.  Labs today: CBC diff, CMET, Magnesium.  I personally reviewed and went over laboratory results with the patient.  The results are noted within this dictation.  Labs satisfy treatment parameters.  Hyperglycemia is noted.  As a result, he is given 6 units of insulin with repeat glucose.  Hyperglycemia persisted and therefore, repeat insulin does of 8 units is ordered.  Dexamethasone premedication dose is reduced to 12 mg (compared to 20 mg).  He notes BRBPR on toilet paper only with anal burning.  He is recommended to use preparation-H.  In AM, he notes blurry vision.  We will provide him recommendations regarding local ophthalmologists.  CT CAP w contrast as scheduled to evaluate response to therapy.  He may be moving back to [place], [place] area.  He is advised to let us know if/when he is so we can transition his care to [facility] as seamlessly as possible with adversely affecting his cancer care.  Return in 2 weeks for follow-up and next cycle of chemotherapy, pending CT results.

## 2016-08-09 NOTE — Progress Notes (Signed)
[name], NP [place] [address]  Hyperglycemia - Plan: insulin aspart (novoLOG) injection 8 Units, Glucose, random, insulin aspart (novoLOG) injection 8 Units  Malignant neoplasm of lower third of esophagus (HCC) - Plan: morphine (MSIR) 15 MG tablet, fentaNYL (DURAGESIC - DOSED MCG/HR) 12 MCG/HR, fentaNYL (DURAGESIC - DOSED MCG/HR) 25 MCG/HR patch  Liver metastases ([place]) - Plan: morphine (MSIR) 15 MG tablet, fentaNYL (DURAGESIC - DOSED MCG/HR) 12 MCG/HR, fentaNYL (DURAGESIC - DOSED MCG/HR) 25 MCG/HR patch  CURRENT THERAPY: FOLFOX beginning on 05/31/2016  INTERVAL HISTORY: Jolon Wojtowicz 55 y.o. 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    Esophageal cancer ([place])   05/04/2016 Miscellaneous    ED Visit secondary to bilateral ankle swelling     05/09/2016 Imaging    Enlarged liver with innumerable hepatic hypodense lesions most c/w metastatic disease. Diffuse mesenteric edema as well as multiple enlarged mesenteric and retroperitoneal LN     05/15/2016 Imaging    Two posterior RUL nodules, indeterminate, 17 mm short axis RL paraesophageal node worrisome for nodal metastasis. Site of primary remains indeterminate     05/16/2016 Initial Biopsy    Ultrasound guided biopsy of R sided liver lesion     05/16/2016 Pathology Results    Metastatic poorly differentiated carcinoma, immunoprofile not entirely specific. Favor poorly differentiated squamous cell carcinoma,      05/25/2016 Procedure    EGD with Dr. Gala Romney with esophageal varices, area of punch out ulceration that appears to be neoplastic     05/25/2016 Pathology Results    The neoplasm stained positive for CK5/6, p63 (squamous cell markers), MOC-31, CK7 (patchy). CDX-2 (weak), TTF-1 (non specific stain), and negative stains for CK20 and Nap A. The immunostain pattern and morphologyfavored poorly diff sq  cell carcinoma esoph     05/29/2016 Procedure    Port-A-Cath placed by interventional radiology     05/31/2016 -  Chemotherapy    FOLFOX     07/12/2016 Miscellaneous    Dx with Enteropathogenic E coli.  Treated with Cipro x 10 days     He reports AM blurriness with regards to his vision.  He notes that it improves and resolves as the day progresses.  He denies any ocular pain or discomfort.  He also notes BRBPR on toilet paper only.  He denies any blood in his stool or dark tarry stools.  He notes anal burning as well without pain or pruritis.  He is considering a move to [place] and he is advised to prepare [place] in the future if he is going to move so we can transition his care to [facility].  Review of Systems  Constitutional: Negative for chills and fever.  HENT: Negative.  Negative for congestion.   Eyes: Positive for blurred vision. Negative for double vision.  Respiratory: Negative for cough and sputum production.   Cardiovascular: Negative.  Negative for chest pain.  Gastrointestinal: Negative for abdominal pain, blood in stool, melena, nausea and vomiting.       Abdominal distension, improved. BRBPR on toilet paper only  Genitourinary: Negative.   Musculoskeletal: Negative.   Skin: Negative.   Neurological: Negative.  Negative for weakness.  Endo/Heme/Allergies: Negative.   Psychiatric/Behavioral: Negative.      Past Medical History:  Diagnosis Date  . Alcohol abuse   . Chicken pox   . Chronic bronchitis ([place])   . Erectile  dysfunction   . Esophageal cancer ([place]) 05/22/2016  . History of stomach ulcers   . Hypertension     Past Surgical History:  Procedure Laterality Date  . APPENDECTOMY  1978  . ESOPHAGOGASTRODUODENOSCOPY N/A 05/25/2016   Procedure: ESOPHAGOGASTRODUODENOSCOPY (EGD);  Surgeon: Daneil Dolin, MD;  Location: [facility];  Service: Endoscopy;  Laterality: N/A;  . HERNIA REPAIR      Family History  Problem Relation Age of Onset  .  Hypertension Mother   . Diabetes Mother   . Cancer Neg Hx   . Heart disease Neg Hx   . Stroke Neg Hx     Social History   Social History  . Marital status: Single    Spouse name: N/A  . Number of children: N/A  . Years of education: N/A   Social History Main Topics  . Smoking status: Never Smoker  . Smokeless tobacco: Current User    Types: Chew     Comment: 5 years  . Alcohol use Yes     Comment: occ  . Drug use: No  . Sexual activity: Yes   Other Topics Concern  . Not on file   Social History Narrative  . No narrative on file     PHYSICAL EXAMINATION  ECOG PERFORMANCE STATUS: 1 - Symptomatic but completely ambulatory  There were no vitals filed for this visit.  BP 117/80 P 100 R 18 T 98.3 O2 Sats 100% on RA  GENERAL:alert, cachectic, smiling, appearing much improved, unaccompanied, and in chemo-recliner SKIN: skin color, texture, turgor are normal, no rashes or significant lesions HEAD: Normocephalic EYES: Conjunctiva are pink and non-injected EARS: External ears normal OROPHARYNX:lips, buccal mucosa, and tongue normal and mucous membranes are moist  NECK: supple, trachea midline LYMPH:  not examined BREAST:not examined LUNGS: clear to auscultation and percussion HEART: regular rate & rhythm without murmur, rub, or gallop. ABDOMEN:abdomen soft, distended, normal bowel sounds and hepatomegaly noted. BACK: Back symmetric, no curvature. EXTREMITIES:less then 2 second capillary refill, no skin discoloration, no cyanosis NEURO: alert & oriented x 3 with fluent speech, no focal motor/sensory deficits, gait normal   LABORATORY DATA: CBC    Component Value Date/Time   WBC 13.0 (H) 08/09/2016 1020   RBC 4.05 (L) 08/09/2016 1020   HGB 12.9 (L) 08/09/2016 1020   HGB 15.1 12/03/2014 1205   HCT 37.7 (L) 08/09/2016 1020   HCT 45.7 12/03/2014 1205   PLT 146 (L) 08/09/2016 1020   PLT 262 12/03/2014 1205   MCV 93.1 08/09/2016 1020   MCV 96 12/03/2014 1205    MCH 31.9 08/09/2016 1020   MCHC 34.2 08/09/2016 1020   RDW 21.1 (H) 08/09/2016 1020   RDW 13.3 12/03/2014 1205   LYMPHSABS 1.8 08/09/2016 1020   LYMPHSABS 1.0 01/14/2014 2000   MONOABS 2.1 (H) 08/09/2016 1020   MONOABS 1.0 01/14/2014 2000   EOSABS 0.0 08/09/2016 1020   EOSABS 0.0 01/14/2014 2000   BASOSABS 0.0 08/09/2016 1020   BASOSABS 0.0 01/14/2014 2000      Chemistry      Component Value Date/Time   NA 130 (L) 08/09/2016 1020   NA 137 12/03/2014 1205   K 4.1 08/09/2016 1020   K 3.8 12/03/2014 1205   CL 94 (L) 08/09/2016 1020   CL 97 (L) 12/03/2014 1205   CO2 27 08/09/2016 1020   CO2 30 12/03/2014 1205   BUN 14 08/09/2016 1020   BUN 8 12/03/2014 1205   CREATININE 0.49 (L) 08/09/2016 1020  CREATININE 0.69 12/03/2014 1205      Component Value Date/Time   CALCIUM 8.4 (L) 08/09/2016 1020   CALCIUM 9.1 12/03/2014 1205   ALKPHOS 283 (H) 08/09/2016 1020   ALKPHOS 71 12/03/2014 1205   AST 63 (H) 08/09/2016 1020   AST 51 (H) 12/03/2014 1205   ALT 63 08/09/2016 1020   ALT 42 12/03/2014 1205   BILITOT 2.9 (H) 08/09/2016 1020   BILITOT 0.9 12/03/2014 1205        PENDING LABS:   RADIOGRAPHIC STUDIES:  No results found.   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  Began palliative systemic chemotherapy on 05/31/2016 with FOLFOX.  Oncotype ID was sent off and reported back as Sarcoma.  This was discussed in [employer]  and pathology is very confident, as are with regards to response with therapy, that we are not dealing with a sarcoma.  Oncology history updated.  Labs today: CBC diff, CMET, Magnesium.  I personally reviewed and went over laboratory results with the patient.  The results are noted within this dictation.  Labs satisfy treatment parameters.  Hyperglycemia is noted.  As a result, he is given 6 units of insulin with repeat  glucose.  Hyperglycemia persisted and therefore, repeat insulin does of 8 units is ordered.  Dexamethasone premedication dose is reduced to 12 mg (compared to 20 mg).  He notes BRBPR on toilet paper only with anal burning.  He is recommended to use preparation-H.  In AM, he notes blurry vision.  We will provide him recommendations regarding local ophthalmologists.  CT CAP w contrast as scheduled to evaluate response to therapy.  He may be moving back to [place], [place] area.  He is advised to let us know if/when he is so we can transition his care to [facility] as seamlessly as possible with adversely affecting his cancer care.  Return in 2 weeks for follow-up and next cycle of chemotherapy, pending CT results.    ORDERS PLACED FOR THIS ENCOUNTER: Orders Placed This Encounter  Procedures  . Glucose, random    MEDICATIONS PRESCRIBED THIS ENCOUNTER: Meds ordered this encounter  Medications  . insulin aspart (novoLOG) injection 8 Units  . insulin aspart (novoLOG) injection 8 Units  . morphine (MSIR) 15 MG tablet    Sig: Take 1 tablet (15 mg total) by mouth every 6 (six) hours as needed for severe pain.    Dispense:  60 tablet    Refill:  0    Order Specific Question:   Supervising Provider    Answer:   Patrici Ranks [id number]  . fentaNYL (DURAGESIC - DOSED MCG/HR) 12 MCG/HR    Sig: Place 1 patch (12.5 mcg total) onto the skin every 3 (three) days.    Dispense:  10 patch    Refill:  0    37.5 mcg    Order Specific Question:   Supervising Provider    Answer:   Patrici Ranks [id number]  . fentaNYL (DURAGESIC - DOSED MCG/HR) 25 MCG/HR patch    Sig: Place 1 patch (25 mcg total) onto the skin every 3 (three) days.    Dispense:  10 patch    Refill:  0    37.5 mcg    Order Specific Question:   Supervising Provider    Answer:   Patrici Ranks [id number]    THERAPY PLAN:  Continue treatment as planned with  supportive care.  All questions were answered. The  patient knows to call the clinic with any problems, questions or concerns. We can certainly see the patient much sooner if necessary.  Patient and plan discussed with Dr. Ancil Linsey and she is in agreement with the aforementioned.   This note is electronically signed by: Doy Mince 08/09/2016 5:24 PM

## 2016-08-09 NOTE — Progress Notes (Signed)
Tolerated tx w/o adverse reaction.  Alert, in no distress.  VSS.  Discharged ambulatory. 

## 2016-08-09 NOTE — Patient Instructions (Signed)
[place] at [facility] Discharge Instructions  RECOMMENDATIONS MADE BY THE CONSULTANT AND ANY TEST RESULTS WILL BE SENT TO YOUR REFERRING PHYSICIAN.  Exam done and seen today by Kirby Crigler Use Preparation-H for hemorrhoids We recommended eye docs to go see for blurry vision. CT scan as scheduled. Return to see the Doctor in 2 weeks to see Dr. Whitney Muse Call the clinic for any concerns or questions.   Thank you for choosing [place] at [facility] to provide your oncology and hematology care.  To afford each patient quality time with our provider, please arrive at least 15 minutes before your scheduled appointment time.   Beginning January 23rd 2017 lab work for the [facility] will be done in the  Main lab at [facility] on 1st floor. If you have a lab appointment with the [place] please come in thru the  Main Entrance and check in at the main information desk  You need to re-schedule your appointment should you arrive 10 or more minutes late.  We strive to give you quality time with our providers, and arriving late affects you and other patients whose appointments are after yours.  Also, if you no show three or more times for appointments you may be dismissed from the clinic at the providers discretion.     Again, thank you for choosing [facility].  Our hope is that these requests will decrease the amount of time that you wait before being seen by our physicians.       _____________________________________________________________  Should you have questions after your visit to [facility], please contact our office at [phone] between the hours of 8:30 a.m. and 4:30 p.m.  Voicemails left after 4:30 p.m. will not be returned until the following business day.  For prescription refill requests, have your pharmacy contact our office.         Resources For Cancer Patients and their  Caregivers ? American Cancer Society: Can assist with transportation, wigs, general needs, runs Look Good Feel Better.        859-631-1154 ? Cancer Care: Provides financial assistance, online support groups, medication/co-pay assistance.  1-800-813-HOPE (734)747-2302) ? [place] Assists [place] cancer patients and their families through emotional , educational and financial support.  629-497-2428 ? [place] DSS Where to apply for food stamps, Medicaid and utility assistance. (725)389-9521 ? RCATS: Transportation to medical appointments. (402)845-1551 ? Social Security Administration: May apply for disability if have a Stage IV cancer. 223-730-8319 (606)275-2668 ? [employer], Disability and Transit Services: Assists with nutrition, care and transit needs. [place] Support Programs: '@10RELATIVEDAYS'$ @ > Cancer Support Group  2nd Tuesday of the month 1pm-2pm, Journey Room  > Creative Journey  3rd Tuesday of the month 1130am-1pm, Journey Room  > Look Good Feel Better  1st Wednesday of the month 10am-12 noon, Journey Room (Call [place] to register [phone])

## 2016-08-09 NOTE — Patient Instructions (Signed)
[place] at [facility] Discharge Instructions  RECOMMENDATIONS MADE BY THE CONSULTANT AND ANY TEST RESULTS WILL BE SENT TO YOUR REFERRING PHYSICIAN.  Please cut back to taking one Dexamethasone tablet a day for 7 days in a row. Then STOP!!! No more Dexamethasone after this.   We refilled your Fentanyl patches today.     Thank you for choosing [place] at [facility] to provide your oncology and hematology care.  To afford each patient quality time with our provider, please arrive at least 15 minutes before your scheduled appointment time.   Beginning January 23rd 2017 lab work for the [facility] will be done in the  Main lab at [facility] on 1st floor. If you have a lab appointment with the [place] please come in thru the  Main Entrance and check in at the main information desk  You need to re-schedule your appointment should you arrive 10 or more minutes late.  We strive to give you quality time with our providers, and arriving late affects you and other patients whose appointments are after yours.  Also, if you no show three or more times for appointments you may be dismissed from the clinic at the providers discretion.     Again, thank you for choosing [facility].  Our hope is that these requests will decrease the amount of time that you wait before being seen by our physicians.       _____________________________________________________________  Should you have questions after your visit to [facility], please contact our office at [phone] between the hours of 8:30 a.m. and 4:30 p.m.  Voicemails left after 4:30 p.m. will not be returned until the following business day.  For prescription refill requests, have your pharmacy contact our office.         Resources For Cancer Patients and their Caregivers ? American Cancer Society: Can assist with transportation, wigs, general needs, runs Look  Good Feel Better.        ([phone] ? Cancer Care: Provides financial assistance, online support groups, medication/co-pay assistance.  1-800-813-HOPE 337-499-2727) ? [place] Assists [place] cancer patients and their families through emotional , educational and financial support.  6824703126 ? [place] DSS Where to apply for food stamps, Medicaid and utility assistance. 951-462-9203 ? RCATS: Transportation to medical appointments. (817)597-5161 ? Social Security Administration: May apply for disability if have a Stage IV cancer. 203-106-8907 505-405-2643 ? [employer], Disability and Transit Services: Assists with nutrition, care and transit needs. [place] Support Programs: '@10RELATIVEDAYS'$ @ > Cancer Support Group  2nd Tuesday of the month 1pm-2pm, Journey Room  > Creative Journey  3rd Tuesday of the month 1130am-1pm, Journey Room  > Look Good Feel Better  1st Wednesday of the month 10am-12 noon, Journey Room (Call [place] to register [phone])

## 2016-08-11 ENCOUNTER — Encounter (HOSPITAL_BASED_OUTPATIENT_CLINIC_OR_DEPARTMENT_OTHER): Payer: 59

## 2016-08-11 VITALS — BP 119/90 | HR 107 | Temp 98.5°F | Resp 20

## 2016-08-11 DIAGNOSIS — C155 Malignant neoplasm of lower third of esophagus: Secondary | ICD-10-CM | POA: Diagnosis not present

## 2016-08-11 DIAGNOSIS — B370 Candidal stomatitis: Secondary | ICD-10-CM | POA: Insufficient documentation

## 2016-08-11 DIAGNOSIS — B3781 Candidal esophagitis: Secondary | ICD-10-CM

## 2016-08-11 DIAGNOSIS — C787 Secondary malignant neoplasm of liver and intrahepatic bile duct: Secondary | ICD-10-CM

## 2016-08-11 DIAGNOSIS — Z452 Encounter for adjustment and management of vascular access device: Secondary | ICD-10-CM

## 2016-08-11 MED ORDER — NYSTATIN 100000 UNIT/ML MT SUSP: OROMUCOSAL | 0 refills | Status: DC

## 2016-08-11 MED ORDER — HEPARIN SOD (PORK) LOCK FLUSH 100 UNIT/ML IV SOLN
500.0000 [IU] | Freq: Once | INTRAVENOUS | Status: AC | PRN
  Administered 2016-08-11: 500 [IU]

## 2016-08-11 MED ORDER — FLUCONAZOLE 100 MG PO TABS
200.0000 mg | ORAL_TABLET | Freq: Once | ORAL | Status: AC
  Administered 2016-08-11: 200 mg via ORAL
  Filled 2016-08-11: qty 1

## 2016-08-11 MED ORDER — HEPARIN SOD (PORK) LOCK FLUSH 100 UNIT/ML IV SOLN
INTRAVENOUS | Status: AC
  Filled 2016-08-11: qty 5

## 2016-08-11 MED ORDER — SODIUM CHLORIDE 0.9% FLUSH
10.0000 mL | INTRAVENOUS | Status: DC | PRN
  Administered 2016-08-11: 10 mL
  Filled 2016-08-11: qty 10

## 2016-08-11 MED ORDER — FLUCONAZOLE 100 MG PO TABS: 100.0000 mg | ORAL_TABLET | Freq: Every day | ORAL | 0 refills | Status: DC

## 2016-08-11 NOTE — Patient Instructions (Signed)
[place] at [facility] Discharge Instructions  RECOMMENDATIONS MADE BY THE CONSULTANT AND ANY TEST RESULTS WILL BE SENT TO YOUR REFERRING PHYSICIAN.   Diflucan '100mg'$  tablet. Take 1 tablet daily x 13 days.   Nystatin. Swish and swallow 19m four times a day.   Both of these drugs treat thrush.     [facility] [facility].  To afford each patient quality time with our provider, please arrive at least 15 minutes before your scheduled appointment time.   Beginning January 23rd 2017 lab work for the [facility] 1[facility]. If you have a lab appointment with the [place] come in thru the  Main Entrance and check in at the main information desk  You need to re-schedule your appointment should you arrive 10 or more minutes late.  We strive to give you quality time with our providers, and arriving late affects you and other patients whose appointments are after yours.  Also, if you no show three or more times for appointments you may be dismissed from the clinic at the providers discretion.     Again, [facility]  Our hope is that these requests will decrease the amount of time that you wait before being seen by our physicians.       _____________________________________________________________  Should you have questions [facility] please contact our office at [phone] between the hours of 8:30 a.m. and 4:30 p.m.  Voicemails left after 4:30 p.m. will not be returned until the following business day.  For prescription refill requests, have your pharmacy contact our office.         Resources For Cancer Patients and their Caregivers ? American Cancer Society: Can assist with transportation, wigs, general needs, runs Look Good Feel Better.         17346888327? Cancer Care: Provides financial assistance, online support groups, medication/co-pay assistance.  1-800-813-HOPE (803-802-1789 ? [place] cancer patients and their families through emotional , educational and financial support.  3302-627-5571? [place] DSS Where to apply for food stamps, Medicaid and utility assistance. 3(404)305-7966? RCATS: Transportation to medical appointments. 3218 220 7015? Social Security Administration: May apply for disability if have a Stage IV cancer. [phone]? [employer] Disability and Transit Services: Assists with nutrition, care and transit needs. [place] Programs: '@10RELATIVEDAYS'$ @ > Cancer Support Group  2nd Tuesday of the month 1pm-2pm, Journey Room  > Creative Journey  3rd Tuesday of the month 1130am-1pm, Journey Room  > Look Good Feel Better  1st Wednesday of the month 10am-12 noon, Journey Room (Call [place] register [phone]

## 2016-08-11 NOTE — Progress Notes (Signed)
Joshua Greene presented for Portacath access and flush. Proper placement of portacath confirmed by CXR. Portacath located rt chest wall accessed with  H 20 needle. Good blood return present. Portacath flushed with 51m NS and 500U/5770mHeparin and needle removed intact. Procedure without incident. Patient tolerated procedure well.  Pt discharged ambulatory and stable.  Pt has yeast and was treated with Diflucan '200mg'$  po while here with a rx called in for Diflucan '100mg'$  daily and Nystatin 70m102mour times a day po.

## 2016-08-17 ENCOUNTER — Ambulatory Visit (HOSPITAL_COMMUNITY): Admission: RE | Admit: 2016-08-17 | Payer: Self-pay | Source: Ambulatory Visit

## 2016-08-18 ENCOUNTER — Telehealth (HOSPITAL_COMMUNITY): Payer: Self-pay | Admitting: *Deleted

## 2016-08-22 NOTE — Progress Notes (Signed)
[name]  Progress Note  Patient Care Team: Jearld Fenton, NP as PCP - General (Internal Medicine)  CHIEF COMPLAINTS:    Esophageal cancer ([place])   05/04/2016 Miscellaneous    ED Visit secondary to bilateral ankle swelling      05/09/2016 Imaging    Enlarged liver with innumerable hepatic hypodense lesions most c/w metastatic disease. Diffuse mesenteric edema as well as multiple enlarged mesenteric and retroperitoneal LN      05/15/2016 Imaging    Two posterior RUL nodules, indeterminate, 17 mm short axis RL paraesophageal node worrisome for nodal metastasis. Site of primary remains indeterminate      05/16/2016 Initial Biopsy    Ultrasound guided biopsy of R sided liver lesion      05/16/2016 Pathology Results    Metastatic poorly differentiated carcinoma, immunoprofile not entirely specific. Favor poorly differentiated squamous cell carcinoma,       05/25/2016 Procedure    EGD with Dr. Gala Romney with esophageal varices, area of punch out ulceration that appears to be neoplastic      05/25/2016 Pathology Results    The neoplasm stained positive for CK5/6, p63 (squamous cell markers), MOC-31, CK7 (patchy). CDX-2 (weak), TTF-1 (non specific stain), and negative stains for CK20 and Nap A. The immunostain pattern and morphologyfavored poorly diff sq cell carcinoma esoph      05/29/2016 Procedure    Port-A-Cath placed by interventional radiology      05/31/2016 -  Chemotherapy    FOLFOX      07/12/2016 Miscellaneous    Dx with Enteropathogenic E coli.  Treated with Cipro x 10 days       HISTORY OF PRESENTING ILLNESS:  Joshua Greene 55 y.o. 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 He has significant liver metastases. He is on dose reduced FOLFOX.  He presents for ongoing FOLFOX. He is tearful today, he has lost his insurance. He has met with financial services here at [facility].   Reports his pain is  managed much better. He states that he hardly has any pain. He still uses his fentanyl. He is now having difficulty affording his pain medication. He currently denies diarrhea.  He states he still has a beer but does not drink much, maybe one a day. Appetite remains fairly poor.   Denies neuropathy.   MEDICAL HISTORY:  Past Medical History:  Diagnosis Date  . Alcohol abuse   . Chicken pox   . Chronic bronchitis ([place])   . Erectile dysfunction   . Esophageal cancer ([place]) 05/22/2016  . History of stomach ulcers   . Hypertension     SURGICAL HISTORY: Past Surgical History:  Procedure Laterality Date  . APPENDECTOMY  1978  . ESOPHAGOGASTRODUODENOSCOPY N/A 05/25/2016   Procedure: ESOPHAGOGASTRODUODENOSCOPY (EGD);  Surgeon: Daneil Dolin, MD;  Location: [facility] ENDO SUITE;  Service: Endoscopy;  Laterality: N/A;  . HERNIA REPAIR      SOCIAL HISTORY: Social History   Social History  . Marital status: Single    Spouse name: N/A  . Number of children: N/A  . Years of education: N/A   Occupational History  . Not on file.   Social History Main Topics  . Smoking status: Never Smoker  . Smokeless tobacco: Current User    Types: Chew     Comment: 5 years  . Alcohol use Yes     Comment: occ  . Drug use: No  . Sexual activity: Yes  Other Topics Concern  . Not on file   Social History Narrative  . No narrative on file   Fiance, engaged for 3 years and known each other their whole lives 1 son 1 grandchild Non smoker ETOH, a lot of beer. He has quit drinking for over a month.  Chews tobacco. He works in the [employer] and has for 3-4 months  FAMILY HISTORY: Family History  Problem Relation Age of Onset  . Hypertension Mother   . Diabetes Mother   . Cancer Neg Hx   . Heart disease Neg Hx   . Stroke Neg Hx    Mother is still living at 41 yo Father is still living at 30 yo 3 sisters and 1 brother, healthy  ALLERGIES:  has No Known Allergies.  MEDICATIONS:   Current Outpatient Prescriptions  Medication Sig Dispense Refill  . fentaNYL (DURAGESIC - DOSED MCG/HR) 12 MCG/HR Place 1 patch (12.5 mcg total) onto the skin every 3 (three) days. 10 patch 0  . fentaNYL (DURAGESIC - DOSED MCG/HR) 25 MCG/HR patch Place 1 patch (25 mcg total) onto the skin every 3 (three) days. 10 patch 0  . fluorouracil CALGB 17616 in sodium chloride 0.9 % 150 mL Inject into the vein. To start 06/05/16. To be given every 14 days. To infuse over 46 hours    . furosemide (LASIX) 20 MG tablet Take 1 tablet (20 mg total) by mouth daily. 30 tablet 1  . leucovorin 50 MG injection Inject into the vein daily. To start 06/05/16. To be given every 14 days.    Marland Kitchen lidocaine-prilocaine (EMLA) cream Apply a quarter size amount to port site 1 hour prior to chemo. Do not rub in. Cover with plastic wrap. 30 g 3  . loperamide (IMODIUM A-D) 2 MG tablet Take 1 tablet (2 mg total) by mouth 4 (four) times daily as needed for diarrhea or loose stools. 30 tablet 0  . morphine (MSIR) 15 MG tablet Take 1 tablet (15 mg total) by mouth every 6 (six) hours as needed for severe pain. 60 tablet 0  . nystatin (MYCOSTATIN) 100000 UNIT/ML suspension Swish and swallow 46m four times a day. 240 mL 0  . ondansetron (ZOFRAN) 8 MG tablet Take 1 tablet (8 mg total) by mouth every 8 (eight) hours as needed for nausea or vomiting. 30 tablet 2  . OXALIPLATIN IV Inject into the vein. To start 06/05/16. To be given every 14 days.    . potassium chloride SA (K-DUR,KLOR-CON) 20 MEQ tablet Take 3 tablets (60 mEq total) by mouth daily. 60 tablet 1  . prochlorperazine (COMPAZINE) 10 MG tablet Take 1 tablet (10 mg total) by mouth every 6 (six) hours as needed for nausea or vomiting. 30 tablet 2  . albuterol (PROVENTIL HFA;VENTOLIN HFA) 108 (90 Base) MCG/ACT inhaler Inhale 2 puffs into the lungs every 6 (six) hours as needed for wheezing or shortness of breath. (Patient not taking: Reported on 07/12/2016) 1 Inhaler 2  . benzonatate  (TESSALON PERLES) 100 MG capsule Take 2 capsules (200 mg total) by mouth 3 (three) times daily as needed for cough. (Patient not taking: Reported on 07/12/2016) 30 capsule 0  . dextromethorphan-guaiFENesin (MUCINEX DM) 30-600 MG 12hr tablet Take 1 tablet by mouth 2 (two) times daily. (Patient not taking: Reported on 08/23/2016) 14 tablet 1  . disulfiram (ANTABUSE) 250 MG tablet Take 1 tablet (250 mg total) by mouth daily. (Patient not taking: Reported on 08/23/2016) 30 tablet 0  . Disulfiram  500 MG TABS Take 1 tablet (500 mg total) by mouth daily. (Patient not taking: Reported on 08/23/2016) 14 each 0  . fluconazole (DIFLUCAN) 100 MG tablet Take 1 tablet (100 mg total) by mouth daily. (Patient not taking: Reported on 08/23/2016) 13 tablet 0  . sildenafil (REVATIO) 20 MG tablet Take 2-5 tablets as needed for sexual activity (Patient not taking: Reported on 08/23/2016) 50 tablet 0   Current Facility-Administered Medications  Medication Dose Route Frequency Provider Last Rate Last Dose  . fentaNYL (DURAGESIC - dosed mcg/hr) 12.5 mcg  12.5 mcg Transdermal Q72H Patrici Ranks, MD   12.5 mcg at 08/23/16 1146  . fentaNYL (DURAGESIC - dosed mcg/hr) patch 25 mcg  25 mcg Transdermal Q72H Patrici Ranks, MD   25 mcg at 08/23/16 1147  . potassium chloride SA (K-DUR,KLOR-CON) CR tablet 40 mEq  40 mEq Oral Once Patrici Ranks, MD       Facility-Administered Medications Ordered in Other Visits  Medication Dose Route Frequency Provider Last Rate Last Dose  . dexamethasone (DECADRON) 12 mg in sodium chloride 0.9 % 50 mL IVPB  12 mg Intravenous Once Patrici Ranks, MD      . dextrose 5 % solution   Intravenous Once Patrici Ranks, MD      . fluorouracil (ADRUCIL) 3,400 mg in sodium chloride 0.9 % 82 mL chemo infusion  1,680 mg/m2 (Treatment Plan Recorded) Intravenous 1 day or 1 dose Patrici Ranks, MD      . leucovorin 804 mg in dextrose 5 % 250 mL infusion  400 mg/m2 (Treatment Plan Recorded)  Intravenous Once Patrici Ranks, MD      . oxaliplatin (ELOXATIN) 130 mg in dextrose 5 % 500 mL chemo infusion  65 mg/m2 (Treatment Plan Recorded) Intravenous Once Patrici Ranks, MD      . palonosetron (ALOXI) injection 0.25 mg  0.25 mg Intravenous Once Patrici Ranks, MD      . sodium chloride flush (NS) 0.9 % injection 10 mL  10 mL Intracatheter PRN Patrici Ranks, MD        Review of Systems  Constitutional: Positive for malaise/fatigue.  HENT: Negative.   Eyes: Negative.   Respiratory: Negative.   Gastrointestinal: Negative for abdominal pain and diarrhea.       Abdominal swelling. Abdominal pain improved.   Genitourinary: Negative.   Musculoskeletal: Negative.   Skin: Negative.   Neurological: Positive for weakness. Negative for speech change.  Endo/Heme/Allergies: Negative.   Psychiatric/Behavioral: The patient has insomnia.   All other systems reviewed and are negative. 14 point ROS was done and is otherwise as detailed above or in HPI   PHYSICAL EXAMINATION: ECOG PERFORMANCE STATUS: 2 - Symptomatic, <50% confined to bed  Vitals - 1 value per visit 7/82/4235  SYSTOLIC 361  DIASTOLIC 79  Pulse 443  Temperature 98.8  Respirations 18  Weight (lb) 161  Height   BMI 20.67  VISIT REPORT     Physical Exam  Constitutional: He is oriented to person, place, and time.  Cachectic. Marland Kitchen  HENT:  Head: Normocephalic and atraumatic.  Mouth/Throat: Oropharynx is clear and moist. No oropharyngeal exudate.  Eyes: Conjunctivae and EOM are normal. Pupils are equal, round, and reactive to light. Right eye exhibits no discharge. Left eye exhibits no discharge.  Neck: Normal range of motion. Neck supple. No tracheal deviation present. No thyromegaly present.  Palpable left submandibular node about 1.5 cm in size  Cardiovascular: Normal rate, regular rhythm and  normal heart sounds.   No murmur heard. Pulmonary/Chest: Effort normal and breath sounds normal. No respiratory  distress. He has no wheezes. He has no rales. He exhibits no tenderness.  Abdominal: Soft. Bowel sounds are normal. He exhibits distension. There is no tenderness. There is no rebound.  Hepatomegaly improved  Musculoskeletal: Normal range of motion. He exhibits edema.  Markedly improved   Lymphadenopathy:    He has cervical adenopathy.  Neurological: He is alert and oriented to person, place, and time. No cranial nerve deficit. Gait normal. Coordination normal.  Skin: Skin is warm and dry. No rash noted. He is not diaphoretic. No erythema.  Psychiatric: Mood, memory, affect and judgment normal.  Nursing note and vitals reviewed.    LABORATORY DATA:  I have reviewed the data as listed Results for JAG, LENZ (MRN [id number])   Ref. Range 08/23/2016 09:56  Sodium Latest Ref Range: 135 - 145 mmol/L 131 (L)  Potassium Latest Ref Range: 3.5 - 5.1 mmol/L 3.2 (L)  Chloride Latest Ref Range: 101 - 111 mmol/L 94 (L)  CO2 Latest Ref Range: 22 - 32 mmol/L 28  BUN Latest Ref Range: 6 - 20 mg/dL 6  Creatinine Latest Ref Range: 0.61 - 1.24 mg/dL 0.45 (L)  Calcium Latest Ref Range: 8.9 - 10.3 mg/dL 8.2 (L)  EGFR (Non-African Amer.) Latest Ref Range: >60 mL/min >60  EGFR (African American) Latest Ref Range: >60 mL/min >60  Glucose Latest Ref Range: 65 - 99 mg/dL 274 (H)  Anion gap Latest Ref Range: 5 - 15  9  Alkaline Phosphatase Latest Ref Range: 38 - 126 U/L 258 (H)  Albumin Latest Ref Range: 3.5 - 5.0 g/dL 2.4 (L)  AST Latest Ref Range: 15 - 41 U/L 77 (H)  ALT Latest Ref Range: 17 - 63 U/L 50  Total Protein Latest Ref Range: 6.5 - 8.1 g/dL 5.8 (L)  Total Bilirubin Latest Ref Range: 0.3 - 1.2 mg/dL 3.0 (H)  WBC Latest Ref Range: 4.0 - 10.5 K/uL 5.4  RBC Latest Ref Range: 4.22 - 5.81 MIL/uL 3.74 (L)  Hemoglobin Latest Ref Range: 13.0 - 17.0 g/dL 12.0 (L)  HCT Latest Ref Range: 39.0 - 52.0 % 35.5 (L)  MCV Latest Ref Range: 78.0 - 100.0 fL 94.9  MCH Latest Ref Range: 26.0 - 34.0 pg 32.1    MCHC Latest Ref Range: 30.0 - 36.0 g/dL 33.8  RDW Latest Ref Range: 11.5 - 15.5 % 20.9 (H)  Platelets Latest Ref Range: 150 - 400 K/uL 95 (L)  Neutrophils Latest Units: % 45  Lymphocytes Latest Units: % 27  Monocytes Relative Latest Units: % 26  Eosinophil Latest Units: % 1  Basophil Latest Units: % 1  NEUT# Latest Ref Range: 1.7 - 7.7 K/uL 2.3  Lymphocyte # Latest Ref Range: 0.7 - 4.0 K/uL 1.5  Monocyte # Latest Ref Range: 0.1 - 1.0 K/uL 1.4 (H)  Eosinophils Absolute Latest Ref Range: 0.0 - 0.7 K/uL 0.1  Basophils Absolute Latest Ref Range: 0.0 - 0.1 K/uL 0.1    RADIOGRAPHIC STUDIES: I have personally reviewed the radiological images as listed and agreed with the findings in the report. No results found. Study Result     CLINICAL DATA: Initial treatment strategy for pulmonary nodules and hepatic lesions.  EXAM: NUCLEAR MEDICINE PET SKULL BASE TO THIGH  TECHNIQUE: 9.1 mCi F-18 FDG was injected intravenously. Full-ring PET imaging was performed from the skull base to thigh after the radiotracer. CT data was obtained and used for attenuation correction  and anatomic localization.  FASTING BLOOD GLUCOSE: Value: 89 mg/dl  COMPARISON: 05/15/2016  FINDINGS: NECK  No hypermetabolic lymph nodes in the neck.  CHEST  There is hypermetabolic activity in esophagus several cm above the gastroesophageal junction with SUV max equal 8.7. There is adjacent hypermetabolic paraesophageal lymph node with SUV max equal 8.9.  There bilateral ground-glass nodules which are hypermetabolic. For example RIGHT upper lobe nodule measuring 16 mm with SUV max equal 2.7 (image 71, series 4). Several additional solid nodules within the lungs which are also hypermetabolic. For example LEFT lower lobe nodule measuring 7 mm on image 79 with SUV max 2.5.  There is hypermetabolic RIGHT hilar lymph nodes with SUV max equal 6.7.  ABDOMEN/PELVIS  There is confluent abnormal  hypermetabolic activity with underlying nodularity throughout the liver consistent with widespread hepatic metastasis. Individual lesions multi measure measure. One region with intense activity in the RIGHT hepatic lobe with SUV max equal 9.0. Lesion in the LEFT hepatic lobe with SUV max equal 8.8.  There is haziness within the abdominal mesentery. No hypermetabolic abdominal pelvic lymph nodes.  Hypermetabolic prevascular lymph node in the precordial space with SUV max equal 7.6  SKELETON  Focal activity in the spinous process of the T1 vertebral body and inferior sternum with SUV max equal 4.4 concerning for skeletal metastasis  IMPRESSION: 1. Evidence of bilateral PULMONARY METASTASIS and widespread HEPATIC METASTASIS. 2. Focal metabolic activity in the distal esophagus is suggestive of PRIMARY ESOPHAGEAL CARCINOMA 3. Hypermetabolic lower paraesophageal metastatic lymph node. 4. Metastatic RIGHT hilar lymph node and precordial lymph node. 5. Evidence of skeletal metastasis in the spinous process of T1 and the inferior sternum.   Electronically Signed  By: Suzy Bouchard M.D.  On: 05/26/2016 15:01     PATHOLOGY   ASSESSMENT & PLAN:  Widespread Metastatic carcinoma of esophageal primary  Liver metastases Cancer cachexia Cancer related Pain Tobacco use Alcohol use Thrombocytopenia Hyperglycemia, prob. Diabetes Uninsured status Hyperbilirubinemia Enteropathogenic E Coli, treated  Will proceed with ongoing therapy. I am really not sure how much he is drinking but we discussed his alcohol use and he was advised to discontinue. We discussed a multitude of reasons why he needs to quit. I have reordered his CT scans.   Platelet count today is 95K, he is already dose reduced, I am not going to dose reduce further, will treat today as indicated.   I have done a fasting glucose, he needs a referral for management of his blood sugars. GLUCOSE on 7/31 was not  random but an am fasting.   He has met with financial services here. I will see what other options we have for his pain medications.   Hypokalemia, he was given potassium orally here today. He notes he will not be able to afford any other additional medication.   End of life issues, living will, HCPOA, code status were all addressed with the patient. I discussed that he has stage IV disease which is not curable.   He will return for follow up with his next cycle of treatment in 2 weeks.   All questions were answered. The patient knows to call the clinic with any problems, questions or concerns.  This note was electronically signed.    Molli Hazard, MD  08/23/2016 11:58 AM

## 2016-08-23 ENCOUNTER — Encounter (HOSPITAL_BASED_OUTPATIENT_CLINIC_OR_DEPARTMENT_OTHER): Payer: 59 | Admitting: Hematology & Oncology

## 2016-08-23 ENCOUNTER — Encounter (HOSPITAL_BASED_OUTPATIENT_CLINIC_OR_DEPARTMENT_OTHER): Payer: 59

## 2016-08-23 ENCOUNTER — Encounter (HOSPITAL_COMMUNITY): Payer: Self-pay | Admitting: Hematology & Oncology

## 2016-08-23 VITALS — BP 131/93 | HR 104 | Temp 99.0°F | Resp 18 | Wt 161.0 lb

## 2016-08-23 DIAGNOSIS — Z8719 Personal history of other diseases of the digestive system: Secondary | ICD-10-CM | POA: Diagnosis not present

## 2016-08-23 DIAGNOSIS — C159 Malignant neoplasm of esophagus, unspecified: Secondary | ICD-10-CM

## 2016-08-23 DIAGNOSIS — C155 Malignant neoplasm of lower third of esophagus: Secondary | ICD-10-CM

## 2016-08-23 DIAGNOSIS — Z7289 Other problems related to lifestyle: Secondary | ICD-10-CM

## 2016-08-23 DIAGNOSIS — D696 Thrombocytopenia, unspecified: Secondary | ICD-10-CM

## 2016-08-23 DIAGNOSIS — I10 Essential (primary) hypertension: Secondary | ICD-10-CM | POA: Diagnosis not present

## 2016-08-23 DIAGNOSIS — C787 Secondary malignant neoplasm of liver and intrahepatic bile duct: Secondary | ICD-10-CM | POA: Diagnosis not present

## 2016-08-23 DIAGNOSIS — E118 Type 2 diabetes mellitus with unspecified complications: Secondary | ICD-10-CM

## 2016-08-23 DIAGNOSIS — Z720 Tobacco use: Secondary | ICD-10-CM | POA: Diagnosis not present

## 2016-08-23 DIAGNOSIS — N529 Male erectile dysfunction, unspecified: Secondary | ICD-10-CM | POA: Diagnosis not present

## 2016-08-23 DIAGNOSIS — Z833 Family history of diabetes mellitus: Secondary | ICD-10-CM | POA: Diagnosis not present

## 2016-08-23 DIAGNOSIS — E876 Hypokalemia: Secondary | ICD-10-CM

## 2016-08-23 DIAGNOSIS — Z809 Family history of malignant neoplasm, unspecified: Secondary | ICD-10-CM | POA: Diagnosis not present

## 2016-08-23 DIAGNOSIS — Z5111 Encounter for antineoplastic chemotherapy: Secondary | ICD-10-CM | POA: Diagnosis not present

## 2016-08-23 DIAGNOSIS — Z9889 Other specified postprocedural states: Secondary | ICD-10-CM | POA: Diagnosis not present

## 2016-08-23 DIAGNOSIS — J42 Unspecified chronic bronchitis: Secondary | ICD-10-CM | POA: Diagnosis not present

## 2016-08-23 LAB — CBC WITH DIFFERENTIAL/PLATELET
BASOS ABS: 0.1 10*3/uL (ref 0.0–0.1)
BASOS PCT: 1 %
EOS ABS: 0.1 10*3/uL (ref 0.0–0.7)
Eosinophils Relative: 1 %
HEMATOCRIT: 35.5 % — AB (ref 39.0–52.0)
Hemoglobin: 12 g/dL — ABNORMAL LOW (ref 13.0–17.0)
LYMPHS PCT: 27 %
Lymphs Abs: 1.5 10*3/uL (ref 0.7–4.0)
MCH: 32.1 pg (ref 26.0–34.0)
MCHC: 33.8 g/dL (ref 30.0–36.0)
MCV: 94.9 fL (ref 78.0–100.0)
Monocytes Absolute: 1.4 10*3/uL — ABNORMAL HIGH (ref 0.1–1.0)
Monocytes Relative: 26 %
NEUTROS PCT: 45 %
Neutro Abs: 2.3 10*3/uL (ref 1.7–7.7)
PLATELETS: 95 10*3/uL — AB (ref 150–400)
RBC: 3.74 MIL/uL — ABNORMAL LOW (ref 4.22–5.81)
RDW: 20.9 % — AB (ref 11.5–15.5)
WBC: 5.4 10*3/uL (ref 4.0–10.5)

## 2016-08-23 LAB — COMPREHENSIVE METABOLIC PANEL
ALBUMIN: 2.4 g/dL — AB (ref 3.5–5.0)
ALT: 50 U/L (ref 17–63)
AST: 77 U/L — AB (ref 15–41)
Alkaline Phosphatase: 258 U/L — ABNORMAL HIGH (ref 38–126)
Anion gap: 9 (ref 5–15)
BUN: 6 mg/dL (ref 6–20)
CHLORIDE: 94 mmol/L — AB (ref 101–111)
CO2: 28 mmol/L (ref 22–32)
Calcium: 8.2 mg/dL — ABNORMAL LOW (ref 8.9–10.3)
Creatinine, Ser: 0.45 mg/dL — ABNORMAL LOW (ref 0.61–1.24)
GFR calc Af Amer: >60 mL/min (ref >60)
GFR calc non Af Amer: >60 mL/min (ref >60)
GLUCOSE: 274 mg/dL — AB (ref 65–99)
POTASSIUM: 3.2 mmol/L — AB (ref 3.5–5.1)
Sodium: 131 mmol/L — ABNORMAL LOW (ref 135–145)
Total Bilirubin: 3 mg/dL — ABNORMAL HIGH (ref 0.3–1.2)
Total Protein: 5.8 g/dL — ABNORMAL LOW (ref 6.5–8.1)

## 2016-08-23 MED ORDER — DEXAMETHASONE SODIUM PHOSPHATE 100 MG/10ML IJ SOLN
12.0000 mg | Freq: Once | INTRAMUSCULAR | Status: AC
  Administered 2016-08-23: 12 mg via INTRAVENOUS
  Filled 2016-08-23: qty 1.2

## 2016-08-23 MED ORDER — OXALIPLATIN CHEMO INJECTION 100 MG/20ML
65.0000 mg/m2 | Freq: Once | INTRAVENOUS | Status: AC
  Administered 2016-08-23: 130 mg via INTRAVENOUS
  Filled 2016-08-23: qty 26

## 2016-08-23 MED ORDER — FENTANYL 12 MCG/HR TD PT72
12.5000 ug | MEDICATED_PATCH | TRANSDERMAL | Status: DC
  Administered 2016-08-23: 12.5 ug via TRANSDERMAL
  Filled 2016-08-23: qty 1

## 2016-08-23 MED ORDER — POTASSIUM CHLORIDE CRYS ER 20 MEQ PO TBCR
40.0000 meq | EXTENDED_RELEASE_TABLET | Freq: Once | ORAL | Status: AC
  Administered 2016-08-23: 40 meq via ORAL
  Filled 2016-08-23: qty 2

## 2016-08-23 MED ORDER — FENTANYL 25 MCG/HR TD PT72
25.0000 ug | MEDICATED_PATCH | TRANSDERMAL | Status: DC
  Administered 2016-08-23: 25 ug via TRANSDERMAL
  Filled 2016-08-23: qty 1

## 2016-08-23 MED ORDER — SODIUM CHLORIDE 0.9% FLUSH
10.0000 mL | INTRAVENOUS | Status: DC | PRN
  Administered 2016-08-23: 10 mL
  Filled 2016-08-23: qty 10

## 2016-08-23 MED ORDER — LEUCOVORIN CALCIUM INJECTION 350 MG
398.0000 mg/m2 | Freq: Once | INTRAVENOUS | Status: AC
  Administered 2016-08-23: 800 mg via INTRAVENOUS
  Filled 2016-08-23: qty 40

## 2016-08-23 MED ORDER — FENTANYL 25 MCG/HR TD PT72: 25.0000 ug | MEDICATED_PATCH | TRANSDERMAL | 0 refills | Status: DC

## 2016-08-23 MED ORDER — PALONOSETRON HCL INJECTION 0.25 MG/5ML
INTRAVENOUS | Status: AC
  Filled 2016-08-23: qty 5

## 2016-08-23 MED ORDER — FENTANYL 12 MCG/HR TD PT72: 12.5000 ug | MEDICATED_PATCH | TRANSDERMAL | 0 refills | Status: DC

## 2016-08-23 MED ORDER — SODIUM CHLORIDE 0.9 % IV SOLN
1680.0000 mg/m2 | INTRAVENOUS | Status: DC
  Administered 2016-08-23: 3400 mg via INTRAVENOUS
  Filled 2016-08-23: qty 50

## 2016-08-23 MED ORDER — PALONOSETRON HCL INJECTION 0.25 MG/5ML
0.2500 mg | Freq: Once | INTRAVENOUS | Status: AC
  Administered 2016-08-23: 0.25 mg via INTRAVENOUS

## 2016-08-23 MED ORDER — DEXTROSE 5 % IV SOLN
Freq: Once | INTRAVENOUS | Status: AC
  Administered 2016-08-23: 12:00:00 via INTRAVENOUS

## 2016-08-23 NOTE — Patient Instructions (Signed)
[place] at [facility] Discharge Instructions  RECOMMENDATIONS MADE BY THE CONSULTANT AND ANY TEST RESULTS WILL BE SENT TO YOUR REFERRING PHYSICIAN.  You were seen by Dr. Whitney Muse today. Please see Dr. Moshe Cipro due to new onset diabetes CT scan as scheduled Return to Center in 2 weeks with labs and Chemo.  Thank you for choosing [place] at [facility] to provide your oncology and hematology care.  To afford each patient quality time with our provider, please arrive at least 15 minutes before your scheduled appointment time.   Beginning January 23rd 2017 lab work for the [facility] will be done in the  Main lab at [facility] on 1st floor. If you have a lab appointment with the [place] please come in thru the  Main Entrance and check in at the main information desk  You need to re-schedule your appointment should you arrive 10 or more minutes late.  We strive to give you quality time with our providers, and arriving late affects you and other patients whose appointments are after yours.  Also, if you no show three or more times for appointments you may be dismissed from the clinic at the providers discretion.     Again, thank you for choosing [facility].  Our hope is that these requests will decrease the amount of time that you wait before being seen by our physicians.       _____________________________________________________________  Should you have questions after your visit to [facility], please contact our office at [phone] between the hours of 8:30 a.m. and 4:30 p.m.  Voicemails left after 4:30 p.m. will not be returned until the following business day.  For prescription refill requests, have your pharmacy contact our office.         Resources For Cancer Patients and their Caregivers ? American Cancer Society: Can assist with transportation, wigs, general needs, runs Look Good Feel Better.         2508688887 ? Cancer Care: Provides financial assistance, online support groups, medication/co-pay assistance.  1-800-813-HOPE 303-742-9238) ? [place] Assists [place] cancer patients and their families through emotional , educational and financial support.  301-159-9712 ? [place] DSS Where to apply for food stamps, Medicaid and utility assistance. (201) 049-3604 ? RCATS: Transportation to medical appointments. 702-347-4780 ? Social Security Administration: May apply for disability if have a Stage IV cancer. 989-111-5326 615 406 8597 ? [employer], Disability and Transit Services: Assists with nutrition, care and transit needs. [place] Support Programs: '@10RELATIVEDAYS'$ @ > Cancer Support Group  2nd Tuesday of the month 1pm-2pm, Journey Room  > Creative Journey  3rd Tuesday of the month 1130am-1pm, Journey Room  > Look Good Feel Better  1st Wednesday of the month 10am-12 noon, Journey Room (Call [place] to register [phone])

## 2016-08-23 NOTE — Progress Notes (Signed)
Patient received chemotherapy today, tolerated well without problems. CADD pump infusing. Patient discharged ambulatory , home to self. Vitals stable. Patient is stable for discharge.

## 2016-08-23 NOTE — Progress Notes (Signed)
OK to treat with chemo per Dr.Penland with platelets 95,000.

## 2016-08-23 NOTE — Patient Instructions (Signed)
[facility] Discharge Instructions for Patients Receiving Chemotherapy   Beginning January 23rd 2017 lab work for the [facility] will be done in the  Main lab at [facility] on 1st floor. If you have a lab appointment with the [facility] please come in thru the  Main Entrance and check in at the main information desk   Today you received the following chemotherapy agents Folfox  To help prevent nausea and vomiting after your treatment, we encourage you to take your nausea medication     If you develop nausea and vomiting, or diarrhea that is not controlled by your medication, call the clinic.  The clinic phone number is [phone]. Office hours are Monday-Friday 8:30am-5:00pm.  BELOW ARE SYMPTOMS THAT SHOULD BE REPORTED IMMEDIATELY:  *FEVER GREATER THAN 101.0 F  *CHILLS WITH OR WITHOUT FEVER  NAUSEA AND VOMITING THAT IS NOT CONTROLLED WITH YOUR NAUSEA MEDICATION  *UNUSUAL SHORTNESS OF BREATH  *UNUSUAL BRUISING OR BLEEDING  TENDERNESS IN MOUTH AND THROAT WITH OR WITHOUT PRESENCE OF ULCERS  *URINARY PROBLEMS  *BOWEL PROBLEMS  UNUSUAL RASH Items with * indicate a potential emergency and should be followed up as soon as possible. If you have an emergency after office hours please contact your primary care physician or go to the nearest emergency department.  Please call the clinic during office hours if you have any questions or concerns.   You may also contact the Patient Navigator at [phone] should you have any questions or need assistance in obtaining follow up care.      Resources For Cancer Patients and their Caregivers ? American Cancer Society: Can assist with transportation, wigs, general needs, runs Look Good Feel Better.        (504) 206-5211 ? Cancer Care: Provides financial assistance, online support groups, medication/co-pay assistance.  1-800-813-HOPE 5341326700) ? [place] Assists [place]  cancer patients and their families through emotional , educational and financial support.  (873)084-2684 ? [place] DSS Where to apply for food stamps, Medicaid and utility assistance. (201)281-5761 ? RCATS: Transportation to medical appointments. (202) 568-4023 ? Social Security Administration: May apply for disability if have a Stage IV cancer. (503)137-2778 623-830-5779 ? [employer], Disability and Transit Services: Assists with nutrition, care and transit needs. 620-370-0694

## 2016-08-24 ENCOUNTER — Ambulatory Visit (HOSPITAL_COMMUNITY): Payer: Self-pay

## 2016-08-24 LAB — HEMOGLOBIN A1C
Hgb A1c MFr Bld: 9.4 % — ABNORMAL HIGH (ref 4.8–5.6)
Mean Plasma Glucose: 223 mg/dL

## 2016-08-25 ENCOUNTER — Encounter (HOSPITAL_COMMUNITY): Payer: Medicaid Other | Attending: Hematology & Oncology

## 2016-08-25 VITALS — BP 122/91 | HR 120 | Temp 98.2°F | Resp 18

## 2016-08-25 DIAGNOSIS — Z8719 Personal history of other diseases of the digestive system: Secondary | ICD-10-CM | POA: Insufficient documentation

## 2016-08-25 DIAGNOSIS — Z823 Family history of stroke: Secondary | ICD-10-CM | POA: Insufficient documentation

## 2016-08-25 DIAGNOSIS — C155 Malignant neoplasm of lower third of esophagus: Secondary | ICD-10-CM

## 2016-08-25 DIAGNOSIS — C159 Malignant neoplasm of esophagus, unspecified: Secondary | ICD-10-CM

## 2016-08-25 DIAGNOSIS — N529 Male erectile dysfunction, unspecified: Secondary | ICD-10-CM | POA: Insufficient documentation

## 2016-08-25 DIAGNOSIS — Z452 Encounter for adjustment and management of vascular access device: Secondary | ICD-10-CM

## 2016-08-25 DIAGNOSIS — Z809 Family history of malignant neoplasm, unspecified: Secondary | ICD-10-CM | POA: Insufficient documentation

## 2016-08-25 DIAGNOSIS — I10 Essential (primary) hypertension: Secondary | ICD-10-CM | POA: Insufficient documentation

## 2016-08-25 DIAGNOSIS — G893 Neoplasm related pain (acute) (chronic): Secondary | ICD-10-CM | POA: Insufficient documentation

## 2016-08-25 DIAGNOSIS — C787 Secondary malignant neoplasm of liver and intrahepatic bile duct: Secondary | ICD-10-CM | POA: Insufficient documentation

## 2016-08-25 DIAGNOSIS — R64 Cachexia: Secondary | ICD-10-CM | POA: Insufficient documentation

## 2016-08-25 DIAGNOSIS — Z8249 Family history of ischemic heart disease and other diseases of the circulatory system: Secondary | ICD-10-CM | POA: Insufficient documentation

## 2016-08-25 DIAGNOSIS — Z9889 Other specified postprocedural states: Secondary | ICD-10-CM | POA: Insufficient documentation

## 2016-08-25 DIAGNOSIS — R490 Dysphonia: Secondary | ICD-10-CM | POA: Insufficient documentation

## 2016-08-25 DIAGNOSIS — J42 Unspecified chronic bronchitis: Secondary | ICD-10-CM | POA: Insufficient documentation

## 2016-08-25 DIAGNOSIS — Z79899 Other long term (current) drug therapy: Secondary | ICD-10-CM | POA: Insufficient documentation

## 2016-08-25 DIAGNOSIS — Z720 Tobacco use: Secondary | ICD-10-CM | POA: Insufficient documentation

## 2016-08-25 DIAGNOSIS — Z833 Family history of diabetes mellitus: Secondary | ICD-10-CM | POA: Insufficient documentation

## 2016-08-25 MED ORDER — HEPARIN SOD (PORK) LOCK FLUSH 100 UNIT/ML IV SOLN
500.0000 [IU] | Freq: Once | INTRAVENOUS | Status: AC | PRN
  Administered 2016-08-25: 500 [IU]

## 2016-08-25 MED ORDER — HEPARIN SOD (PORK) LOCK FLUSH 100 UNIT/ML IV SOLN
INTRAVENOUS | Status: AC
  Filled 2016-08-25: qty 5

## 2016-08-25 MED ORDER — SODIUM CHLORIDE 0.9% FLUSH
10.0000 mL | INTRAVENOUS | Status: DC | PRN
  Administered 2016-08-25: 10 mL
  Filled 2016-08-25: qty 10

## 2016-08-25 NOTE — Progress Notes (Signed)
Patient arrived for pump removal and port flush.  Home infusion completed without issue.  Pump removed and port flushed per protocol.  Patient denies any side effects at this time.  Patient VSS, ambulatory, and stable at discharge from clinic.

## 2016-08-25 NOTE — Patient Instructions (Signed)
[place] at [facility] Discharge Instructions  RECOMMENDATIONS MADE BY THE CONSULTANT AND ANY TEST RESULTS WILL BE SENT TO YOUR REFERRING PHYSICIAN.  Port flush with pump removal.    Thank you for choosing [place] at [facility] to provide your oncology and hematology care.  To afford each patient quality time with our provider, please arrive at least 15 minutes before your scheduled appointment time.   Beginning January 23rd 2017 lab work for the [facility] will be done in the  Main lab at [facility] on 1st floor. If you have a lab appointment with the [place] please come in thru the  Main Entrance and check in at the main information desk  You need to re-schedule your appointment should you arrive 10 or more minutes late.  We strive to give you quality time with our providers, and arriving late affects you and other patients whose appointments are after yours.  Also, if you no show three or more times for appointments you may be dismissed from the clinic at the providers discretion.     Again, thank you for choosing [facility].  Our hope is that these requests will decrease the amount of time that you wait before being seen by our physicians.       _____________________________________________________________  Should you have questions after your visit to [facility], please contact our office at [phone] between the hours of 8:30 a.m. and 4:30 p.m.  Voicemails left after 4:30 p.m. will not be returned until the following business day.  For prescription refill requests, have your pharmacy contact our office.         Resources For Cancer Patients and their Caregivers ? American Cancer Society: Can assist with transportation, wigs, general needs, runs Look Good Feel Better.        ([phone] ? Cancer Care: Provides financial assistance, online support groups, medication/co-pay assistance.   1-800-813-HOPE 929-204-8546) ? [place] Assists [place] cancer patients and their families through emotional , educational and financial support.  279-102-2358 ? [place] DSS Where to apply for food stamps, Medicaid and utility assistance. 508-523-3692 ? RCATS: Transportation to medical appointments. 734-763-6285 ? Social Security Administration: May apply for disability if have a Stage IV cancer. [phone] 819-529-8217 ? [employer], Disability and Transit Services: Assists with nutrition, care and transit needs. [place] Support Programs: '@10RELATIVEDAYS'$ @ > Cancer Support Group  2nd Tuesday of the month 1pm-2pm, Journey Room  > Creative Journey  3rd Tuesday of the month 1130am-1pm, Journey Room  > Look Good Feel Better  1st Wednesday of the month 10am-12 noon, Journey Room (Call [place] to register [phone])

## 2016-08-31 DIAGNOSIS — C787 Secondary malignant neoplasm of liver and intrahepatic bile duct: Secondary | ICD-10-CM | POA: Diagnosis not present

## 2016-08-31 DIAGNOSIS — C159 Malignant neoplasm of esophagus, unspecified: Secondary | ICD-10-CM | POA: Diagnosis not present

## 2016-09-04 ENCOUNTER — Ambulatory Visit (HOSPITAL_COMMUNITY): Payer: Self-pay

## 2016-09-06 ENCOUNTER — Ambulatory Visit (HOSPITAL_COMMUNITY): Payer: 59

## 2016-09-06 ENCOUNTER — Telehealth: Payer: Self-pay | Admitting: Internal Medicine

## 2016-09-06 ENCOUNTER — Other Ambulatory Visit: Payer: Self-pay | Admitting: Internal Medicine

## 2016-09-06 ENCOUNTER — Encounter (HOSPITAL_COMMUNITY): Payer: Self-pay | Admitting: Oncology

## 2016-09-06 ENCOUNTER — Encounter (HOSPITAL_BASED_OUTPATIENT_CLINIC_OR_DEPARTMENT_OTHER): Payer: Medicaid Other

## 2016-09-06 ENCOUNTER — Encounter (HOSPITAL_BASED_OUTPATIENT_CLINIC_OR_DEPARTMENT_OTHER): Payer: Medicaid Other | Admitting: Oncology

## 2016-09-06 VITALS — BP 133/87 | HR 88 | Temp 98.4°F | Resp 18 | Wt 161.6 lb

## 2016-09-06 DIAGNOSIS — Z833 Family history of diabetes mellitus: Secondary | ICD-10-CM | POA: Diagnosis not present

## 2016-09-06 DIAGNOSIS — C787 Secondary malignant neoplasm of liver and intrahepatic bile duct: Secondary | ICD-10-CM

## 2016-09-06 DIAGNOSIS — R490 Dysphonia: Secondary | ICD-10-CM | POA: Diagnosis not present

## 2016-09-06 DIAGNOSIS — R64 Cachexia: Secondary | ICD-10-CM | POA: Diagnosis not present

## 2016-09-06 DIAGNOSIS — Z823 Family history of stroke: Secondary | ICD-10-CM | POA: Diagnosis not present

## 2016-09-06 DIAGNOSIS — E119 Type 2 diabetes mellitus without complications: Secondary | ICD-10-CM | POA: Diagnosis not present

## 2016-09-06 DIAGNOSIS — Z5111 Encounter for antineoplastic chemotherapy: Secondary | ICD-10-CM | POA: Diagnosis present

## 2016-09-06 DIAGNOSIS — Z720 Tobacco use: Secondary | ICD-10-CM | POA: Diagnosis not present

## 2016-09-06 DIAGNOSIS — C155 Malignant neoplasm of lower third of esophagus: Secondary | ICD-10-CM

## 2016-09-06 DIAGNOSIS — Z8249 Family history of ischemic heart disease and other diseases of the circulatory system: Secondary | ICD-10-CM | POA: Diagnosis not present

## 2016-09-06 DIAGNOSIS — Z809 Family history of malignant neoplasm, unspecified: Secondary | ICD-10-CM | POA: Diagnosis not present

## 2016-09-06 DIAGNOSIS — Z8719 Personal history of other diseases of the digestive system: Secondary | ICD-10-CM | POA: Diagnosis not present

## 2016-09-06 DIAGNOSIS — J42 Unspecified chronic bronchitis: Secondary | ICD-10-CM | POA: Diagnosis not present

## 2016-09-06 DIAGNOSIS — C159 Malignant neoplasm of esophagus, unspecified: Secondary | ICD-10-CM | POA: Diagnosis not present

## 2016-09-06 DIAGNOSIS — G893 Neoplasm related pain (acute) (chronic): Secondary | ICD-10-CM | POA: Diagnosis not present

## 2016-09-06 DIAGNOSIS — I10 Essential (primary) hypertension: Secondary | ICD-10-CM | POA: Diagnosis not present

## 2016-09-06 DIAGNOSIS — Z79899 Other long term (current) drug therapy: Secondary | ICD-10-CM | POA: Diagnosis not present

## 2016-09-06 DIAGNOSIS — N529 Male erectile dysfunction, unspecified: Secondary | ICD-10-CM | POA: Diagnosis not present

## 2016-09-06 DIAGNOSIS — Z9889 Other specified postprocedural states: Secondary | ICD-10-CM | POA: Diagnosis not present

## 2016-09-06 LAB — CBC WITH DIFFERENTIAL/PLATELET
BASOS ABS: 0 10*3/uL (ref 0.0–0.1)
Basophils Relative: 0 %
Eosinophils Absolute: 0 10*3/uL (ref 0.0–0.7)
Eosinophils Relative: 0 %
HEMATOCRIT: 33.9 % — AB (ref 39.0–52.0)
HEMOGLOBIN: 11.3 g/dL — AB (ref 13.0–17.0)
LYMPHS PCT: 28 %
Lymphs Abs: 2.8 10*3/uL (ref 0.7–4.0)
MCH: 32.7 pg (ref 26.0–34.0)
MCHC: 33.3 g/dL (ref 30.0–36.0)
MCV: 98 fL (ref 78.0–100.0)
MONOS PCT: 23 %
Monocytes Absolute: 2.3 10*3/uL — ABNORMAL HIGH (ref 0.1–1.0)
NEUTROS ABS: 4.9 10*3/uL (ref 1.7–7.7)
NEUTROS PCT: 49 %
Platelets: 133 10*3/uL — ABNORMAL LOW (ref 150–400)
RBC: 3.46 MIL/uL — AB (ref 4.22–5.81)
RDW: 19.2 % — ABNORMAL HIGH (ref 11.5–15.5)
WBC MORPHOLOGY: INCREASED
WBC: 10 10*3/uL (ref 4.0–10.5)

## 2016-09-06 LAB — COMPREHENSIVE METABOLIC PANEL
ALBUMIN: 2.4 g/dL — AB (ref 3.5–5.0)
ALK PHOS: 238 U/L — AB (ref 38–126)
ALT: 32 U/L (ref 17–63)
ANION GAP: 7 (ref 5–15)
AST: 67 U/L — AB (ref 15–41)
BUN: 9 mg/dL (ref 6–20)
CALCIUM: 8.6 mg/dL — AB (ref 8.9–10.3)
CO2: 26 mmol/L (ref 22–32)
Chloride: 102 mmol/L (ref 101–111)
Creatinine, Ser: 0.46 mg/dL — ABNORMAL LOW (ref 0.61–1.24)
GFR calc Af Amer: >60 mL/min (ref >60)
GFR calc non Af Amer: >60 mL/min (ref >60)
GLUCOSE: 114 mg/dL — AB (ref 65–99)
Potassium: 3.7 mmol/L (ref 3.5–5.1)
SODIUM: 135 mmol/L (ref 135–145)
Total Bilirubin: 2.3 mg/dL — ABNORMAL HIGH (ref 0.3–1.2)
Total Protein: 6.5 g/dL (ref 6.5–8.1)

## 2016-09-06 MED ORDER — DEXTROSE 5 % IV SOLN
Freq: Once | INTRAVENOUS | Status: AC
  Administered 2016-09-06: 12:00:00 via INTRAVENOUS

## 2016-09-06 MED ORDER — SODIUM CHLORIDE 0.9 % IV SOLN
1680.0000 mg/m2 | INTRAVENOUS | Status: DC
  Administered 2016-09-06: 3400 mg via INTRAVENOUS
  Filled 2016-09-06: qty 68

## 2016-09-06 MED ORDER — PALONOSETRON HCL INJECTION 0.25 MG/5ML
0.2500 mg | Freq: Once | INTRAVENOUS | Status: AC
  Administered 2016-09-06: 0.25 mg via INTRAVENOUS
  Filled 2016-09-06: qty 5

## 2016-09-06 MED ORDER — FENTANYL 25 MCG/HR TD PT72: 25.0000 ug | MEDICATED_PATCH | TRANSDERMAL | 0 refills | Status: DC

## 2016-09-06 MED ORDER — HEPARIN SOD (PORK) LOCK FLUSH 100 UNIT/ML IV SOLN
500.0000 [IU] | Freq: Once | INTRAVENOUS | Status: DC | PRN
  Filled 2016-09-06: qty 5

## 2016-09-06 MED ORDER — LEUCOVORIN CALCIUM INJECTION 350 MG
400.0000 mg/m2 | Freq: Once | INTRAVENOUS | Status: AC
  Administered 2016-09-06: 804 mg via INTRAVENOUS
  Filled 2016-09-06: qty 40.2

## 2016-09-06 MED ORDER — SODIUM CHLORIDE 0.9 % IV SOLN
12.0000 mg | Freq: Once | INTRAVENOUS | Status: AC
  Administered 2016-09-06: 12 mg via INTRAVENOUS
  Filled 2016-09-06: qty 1.2

## 2016-09-06 MED ORDER — OXALIPLATIN CHEMO INJECTION 100 MG/20ML
65.0000 mg/m2 | Freq: Once | INTRAVENOUS | Status: AC
  Administered 2016-09-06: 130 mg via INTRAVENOUS
  Filled 2016-09-06: qty 26

## 2016-09-06 MED ORDER — SODIUM CHLORIDE 0.9% FLUSH: 10.0000 mL | INTRAVENOUS | Status: DC | PRN

## 2016-09-06 NOTE — Progress Notes (Signed)
Spoke to provider Mina Marble; PA at [facility]; patient to follow up in approximately 2 weeks/neck cycle of chemotherapy at [facility]; and then transfer care to [place] [in approximately 4 weeks; with FOLFOX chemotherapy]. So hold off scheduling appt with me at this time. Will plan to schedule in appx 2 week- after I hear back from [place] PA at [facility].  Dr.B

## 2016-09-06 NOTE — Patient Instructions (Signed)
[place] at [facility] Discharge Instructions  RECOMMENDATIONS MADE BY THE CONSULTANT AND ANY TEST RESULTS WILL BE SENT TO YOUR REFERRING PHYSICIAN.  You were seen by Gershon Mussel today. Scans are schedule Return for follow and treatment in 2 weeks  Thank you for choosing [place] at [facility] to provide your oncology and hematology care.  To afford each patient quality time with our provider, please arrive at least 15 minutes before your scheduled appointment time.   Beginning January 23rd 2017 lab work for the [facility] will be done in the  Main lab at [facility] on 1st floor. If you have a lab appointment with the [place] please come in thru the  Main Entrance and check in at the main information desk  You need to re-schedule your appointment should you arrive 10 or more minutes late.  We strive to give you quality time with our providers, and arriving late affects you and other patients whose appointments are after yours.  Also, if you no show three or more times for appointments you may be dismissed from the clinic at the providers discretion.     Again, thank you for choosing [facility].  Our hope is that these requests will decrease the amount of time that you wait before being seen by our physicians.       _____________________________________________________________  Should you have questions after your visit to [facility], please contact our office at [phone] between the hours of 8:30 a.m. and 4:30 p.m.  Voicemails left after 4:30 p.m. will not be returned until the following business day.  For prescription refill requests, have your pharmacy contact our office.         Resources For Cancer Patients and their Caregivers ? American Cancer Society: Can assist with transportation, wigs, general needs, runs Look Good Feel Better.        ([phone] ? Cancer Care: Provides financial  assistance, online support groups, medication/co-pay assistance.  1-800-813-HOPE 832-474-1953) ? [place] Assists [place] cancer patients and their families through emotional , educational and financial support.  725-603-7536 ? [place] DSS Where to apply for food stamps, Medicaid and utility assistance. 681-094-3539 ? RCATS: Transportation to medical appointments. 607-790-9501 ? Social Security Administration: May apply for disability if have a Stage IV cancer. 725-534-4956 [phone])731-4903 ? [employer], Disability and Transit Services: Assists with nutrition, care and transit needs. [place] Support Programs: '@10RELATIVEDAYS'$ @ > Cancer Support Group  2nd Tuesday of the month 1pm-2pm, Journey Room  > Creative Journey  3rd Tuesday of the month 1130am-1pm, Journey Room  > Look Good Feel Better  1st Wednesday of the month 10am-12 noon, Journey Room (Call [place] to register [phone])

## 2016-09-06 NOTE — Progress Notes (Signed)
[name], NP [place] [address]  Malignant neoplasm of lower third of esophagus ([place]) - Plan: fentaNYL (DURAGESIC - DOSED MCG/HR) 25 MCG/HR patch  Liver metastases ([place]) - Plan: fentaNYL (DURAGESIC - DOSED MCG/HR) 25 MCG/HR patch  CURRENT THERAPY: FOLFOX beginning on 05/31/2016  INTERVAL HISTORY: Yitzchak Antonson 55 y.o. 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    Esophageal cancer ([place])   05/04/2016 Miscellaneous    ED Visit secondary to bilateral ankle swelling      05/09/2016 Imaging    Enlarged liver with innumerable hepatic hypodense lesions most c/w metastatic disease. Diffuse mesenteric edema as well as multiple enlarged mesenteric and retroperitoneal LN      05/15/2016 Imaging    Two posterior RUL nodules, indeterminate, 17 mm short axis RL paraesophageal node worrisome for nodal metastasis. Site of primary remains indeterminate      05/16/2016 Initial Biopsy    Ultrasound guided biopsy of R sided liver lesion      05/16/2016 Pathology Results    Metastatic poorly differentiated carcinoma, immunoprofile not entirely specific. Favor poorly differentiated squamous cell carcinoma,       05/25/2016 Procedure    EGD with Dr. Gala Romney with esophageal varices, area of punch out ulceration that appears to be neoplastic      05/25/2016 Pathology Results    The neoplasm stained positive for CK5/6, p63 (squamous cell markers), MOC-31, CK7 (patchy). CDX-2 (weak), TTF-1 (non specific stain), and negative stains for CK20 and Nap A. The immunostain pattern and morphologyfavored poorly diff sq cell carcinoma esoph      05/29/2016 Procedure    Port-A-Cath placed by interventional radiology      05/31/2016 -  Chemotherapy    FOLFOX      07/12/2016 Miscellaneous    Dx with Enteropathogenic E coli.  Treated with Cipro x 10 days      He continues to  tolerate treatment well. He does not eyes any treatment-related side effects at this time.  He reports that he is ready to be referred to [place] as he will be moving back to [place]. He notes that transportation to our clinic is becoming difficult given the fact that his fiance works third shift and is his means of transportation. He would like to move to [place] with his mother and a brother who will be able to provide better transportation for continued medical oncology care. Additionally, he and his fiance are looking for a new house in [place] and therefore he anticipates permanent residence in [place]. We will refer him accordingly.  He has been referred to establish primary care follow-up. He's been diagnosed with diabetes given his recent fasting glucose in addition to hemoglobin A1c value. He has not yet been able to get an appointment.  He is out of his fentanyl patches. Unfortunately, this medication is cost prohibitive. There is no assistance available for this medication. He thinks he may be able to afford a single dose of fentanyl patches.  Review of Systems  Constitutional: Negative.  Negative for chills, fever and weight loss.  HENT: Negative.   Eyes: Negative.   Respiratory: Negative.  Negative for cough.   Cardiovascular: Negative.  Negative for chest pain.  Gastrointestinal: Negative for constipation, diarrhea, nausea and vomiting.  Genitourinary: Negative.   Musculoskeletal: Negative.   Skin: Negative.   Neurological: Negative.  Endo/Heme/Allergies: Negative.   Psychiatric/Behavioral: Negative.      Past Medical History:  Diagnosis Date  . Alcohol abuse   . Chicken pox   . Chronic bronchitis ([place])   . Erectile dysfunction   . Esophageal cancer ([place]) 05/22/2016  . History of stomach ulcers   . Hypertension     Past Surgical History:  Procedure Laterality Date  . APPENDECTOMY  1978  .  ESOPHAGOGASTRODUODENOSCOPY N/A 05/25/2016   Procedure: ESOPHAGOGASTRODUODENOSCOPY (EGD);  Surgeon: Daneil Dolin, MD;  Location: [facility];  Service: Endoscopy;  Laterality: N/A;  . HERNIA REPAIR      Family History  Problem Relation Age of Onset  . Hypertension Mother   . Diabetes Mother   . Cancer Neg Hx   . Heart disease Neg Hx   . Stroke Neg Hx     Social History   Social History  . Marital status: Single    Spouse name: N/A  . Number of children: N/A  . Years of education: N/A   Social History Main Topics  . Smoking status: Never Smoker  . Smokeless tobacco: Current User    Types: Chew     Comment: 5 years  . Alcohol use Yes     Comment: occ  . Drug use: No  . Sexual activity: Yes   Other Topics Concern  . None   Social History Narrative  . None     PHYSICAL EXAMINATION  ECOG PERFORMANCE STATUS: 1 - Symptomatic but completely ambulatory  There were no vitals filed for this visit.  Vitals - 1 value per visit 0/86/5784  SYSTOLIC 696  DIASTOLIC 87  Pulse 88  Temperature 98.4  Respirations 18     GENERAL:alert, cachectic, smiling, appearing much improved, unaccompanied, and in chemo-bed SKIN: skin color, texture, turgor are normal, no rashes or significant lesions HEAD: Normocephalic EYES: Conjunctiva are pink and non-injected EARS: External ears normal OROPHARYNX:lips, buccal mucosa, and tongue normal and mucous membranes are moist  NECK: supple, trachea midline LYMPH:  not examined BREAST:not examined LUNGS: clear to auscultation and percussion HEART: regular rate & rhythm without murmur, rub, or gallop. ABDOMEN:abdomen soft, distended, normal bowel sounds and hepatomegaly noted. BACK: Back symmetric, no curvature. EXTREMITIES:less then 2 second capillary refill, no skin discoloration, no cyanosis NEURO: alert & oriented x 3 with fluent speech, no focal motor/sensory deficits, gait normal   LABORATORY DATA: CBC    Component Value  Date/Time   WBC 10.0 09/06/2016 1017   RBC 3.46 (L) 09/06/2016 1017   HGB 11.3 (L) 09/06/2016 1017   HGB 15.1 12/03/2014 1205   HCT 33.9 (L) 09/06/2016 1017   HCT 45.7 12/03/2014 1205   PLT 133 (L) 09/06/2016 1017   PLT 262 12/03/2014 1205   MCV 98.0 09/06/2016 1017   MCV 96 12/03/2014 1205   MCH 32.7 09/06/2016 1017   MCHC 33.3 09/06/2016 1017   RDW 19.2 (H) 09/06/2016 1017   RDW 13.3 12/03/2014 1205   LYMPHSABS 2.8 09/06/2016 1017   LYMPHSABS 1.0 01/14/2014 2000   MONOABS 2.3 (H) 09/06/2016 1017   MONOABS 1.0 01/14/2014 2000   EOSABS 0.0 09/06/2016 1017   EOSABS 0.0 01/14/2014 2000   BASOSABS 0.0 09/06/2016 1017   BASOSABS 0.0 01/14/2014 2000      Chemistry      Component Value Date/Time   NA 135 09/06/2016 1017   NA 137 12/03/2014 1205   K 3.7 09/06/2016 1017   K 3.8 12/03/2014 1205   CL 102 09/06/2016  1017   CL 97 (L) 12/03/2014 1205   CO2 26 09/06/2016 1017   CO2 30 12/03/2014 1205   BUN 9 09/06/2016 1017   BUN 8 12/03/2014 1205   CREATININE 0.46 (L) 09/06/2016 1017   CREATININE 0.69 12/03/2014 1205      Component Value Date/Time   CALCIUM 8.6 (L) 09/06/2016 1017   CALCIUM 9.1 12/03/2014 1205   ALKPHOS 238 (H) 09/06/2016 1017   ALKPHOS 71 12/03/2014 1205   AST 67 (H) 09/06/2016 1017   AST 51 (H) 12/03/2014 1205   ALT 32 09/06/2016 1017   ALT 42 12/03/2014 1205   BILITOT 2.3 (H) 09/06/2016 1017   BILITOT 0.9 12/03/2014 1205        PENDING LABS:   RADIOGRAPHIC STUDIES:  No results found.   PATHOLOGY:    ASSESSMENT AND PLAN:  Esophageal cancer ([place]) Stage IV poorly differentiated squamous cell carcinoma of the esophagus with significant liver metastases.  Began palliative systemic chemotherapy on 05/31/2016 with FOLFOX.  Oncotype ID was sent off and reported back as Sarcoma.  This was discussed in [employer]  and pathology is very confident, as are with regards to response with therapy, that we are not dealing with a sarcoma.  Oncology  history updated.  Labs today: CBC diff, CMET, Magnesium.  I personally reviewed and went over laboratory results with the patient.  The results are noted within this dictation.  Labs satisfy treatment parameters.    Fasting glucose and HGB A1C demonstrate diabetes.  We have referred the patient to Dr. Meda Coffee for establishment of PCP care and management of DM.  CT CAP w contrast as scheduled to evaluate response to therapy on 09/18/2016.  He wants to move back to [place], [place] and notes that he has a place to stay.  He notes that he will be moving in with his mother while he searches for a new house in [place] with his Fiance.  As a result, he wants [place] to refer him to [facility].  We will refer the patient and when we find out who he is going to see, we will call that provider to give an update on this gentleman to help with transition of medical oncology care.  I had the opportunity to discuss this patient's case with Dr. Rogue Bussing at [facility] who has accepted the patient in transfer in the near future.  He is uninsured and therefore cannot afford Fentanyl patches.  I will see if there is assistance available for him.  I learned that there is no assistance available.  The patient may be able to afford Fentanyl 25 mcg.  Therefore, I have written an RX.  Return in 2 weeks for follow-up and next cycle of chemotherapy, pending CT results.    ORDERS PLACED FOR THIS ENCOUNTER: No orders of the defined types were placed in this encounter.   MEDICATIONS PRESCRIBED THIS ENCOUNTER: Meds ordered this encounter  Medications  . fentaNYL (DURAGESIC - DOSED MCG/HR) 25 MCG/HR patch    Sig: Place 1 patch (25 mcg total) onto the skin every 3 (three) days.    Dispense:  10 patch    Refill:  0    37.5 mcg    Order Specific Question:   Supervising Provider    Answer:   Patrici Ranks [id number]    THERAPY PLAN:  Continue treatment as planned with supportive care.  All  questions were answered. The patient knows to call the clinic with any problems, questions or  concerns. We can certainly see the patient much sooner if necessary.  Patient and plan discussed with Dr. Ancil Linsey and she is in agreement with the aforementioned.   This note is electronically signed by: Doy Mince 09/06/2016 10:17 PM

## 2016-09-06 NOTE — Assessment & Plan Note (Addendum)
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  Began palliative systemic chemotherapy on 05/31/2016 with FOLFOX.  Oncotype ID was sent off and reported back as Sarcoma.  This was discussed in [employer]  and pathology is very confident, as are with regards to response with therapy, that we are not dealing with a sarcoma.  Oncology history updated.  Labs today: CBC diff, CMET, Magnesium.  I personally reviewed and went over laboratory results with the patient.  The results are noted within this dictation.  Labs satisfy treatment parameters.    Fasting glucose and HGB A1C demonstrate diabetes.  We have referred the patient to Dr. Meda Coffee for establishment of PCP care and management of DM.  CT CAP w contrast as scheduled to evaluate response to therapy on 09/18/2016.  He wants to move back to [place], [place] and notes that he has a place to stay.  He notes that he will be moving in with his mother while he searches for a new house in [place] with his Fiance.  As a result, he wants [place] to refer him to [facility].  We will refer the patient and when we find out who he is going to see, we will call that provider to give an update on this gentleman to help with transition of medical oncology care.  I had the opportunity to discuss this patient's case with Dr. Rogue Bussing at [facility] who has accepted the patient in transfer in the near future.  He is uninsured and therefore cannot afford Fentanyl patches.  I will see if there is assistance available for him.  I learned that there is no assistance available.  The patient may be able to afford Fentanyl 25 mcg.  Therefore, I have written an RX.  Return in 2 weeks for follow-up and next cycle of chemotherapy, pending CT results.

## 2016-09-06 NOTE — Telephone Encounter (Signed)
Left message for the provider, Tom PA- Forest[place] Na to discuss the timing of the follow-up/transition of care to [facility]. Dr.B

## 2016-09-06 NOTE — Patient Instructions (Signed)
[facility] Discharge Instructions for Patients Receiving Chemotherapy   Beginning January 23rd 2017 lab work for the [facility] will be done in the  Main lab at [facility] on 1st floor. If you have a lab appointment with the [facility] please come in thru the  Main Entrance and check in at the main information desk   Today you received the following chemotherapy agents:  Oxaliplatin, Leucovorin, and Fluorouracil.     If you develop nausea and vomiting, or diarrhea that is not controlled by your medication, call the clinic.  The clinic phone number is [phone]. Office hours are Monday-Friday 8:30am-5:00pm.  BELOW ARE SYMPTOMS THAT SHOULD BE REPORTED IMMEDIATELY:  *FEVER GREATER THAN 101.0 F  *CHILLS WITH OR WITHOUT FEVER  NAUSEA AND VOMITING THAT IS NOT CONTROLLED WITH YOUR NAUSEA MEDICATION  *UNUSUAL SHORTNESS OF BREATH  *UNUSUAL BRUISING OR BLEEDING  TENDERNESS IN MOUTH AND THROAT WITH OR WITHOUT PRESENCE OF ULCERS  *URINARY PROBLEMS  *BOWEL PROBLEMS  UNUSUAL RASH Items with * indicate a potential emergency and should be followed up as soon as possible. If you have an emergency after office hours please contact your primary care physician or go to the nearest emergency department.  Please call the clinic during office hours if you have any questions or concerns.   You may also contact the Patient Navigator at [phone] should you have any questions or need assistance in obtaining follow up care.      Resources For Cancer Patients and their Caregivers ? American Cancer Society: Can assist with transportation, wigs, general needs, runs Look Good Feel Better.        669-195-7567 ? Cancer Care: Provides financial assistance, online support groups, medication/co-pay assistance.  1-800-813-HOPE (867)664-5092) ? [place] Assists [place] cancer patients and their families through emotional , educational and  financial support.  (703)324-8676 ? [place] DSS Where to apply for food stamps, Medicaid and utility assistance. 317-139-4490 ? RCATS: Transportation to medical appointments. 231-730-1449 ? Social Security Administration: May apply for disability if have a Stage IV cancer. [phone] ? [employer], Disability and Transit Services: Assists with nutrition, care and transit needs. [phone]

## 2016-09-06 NOTE — Progress Notes (Signed)
Patient states that he would like to wait on his flu vaccination.  Will remind next treatment day.  Patient tolerated infusion well.  VSS.  Patient ambulatory and stable upon discharge from clinic.

## 2016-09-08 ENCOUNTER — Encounter (HOSPITAL_BASED_OUTPATIENT_CLINIC_OR_DEPARTMENT_OTHER): Payer: Medicaid Other

## 2016-09-08 ENCOUNTER — Encounter (HOSPITAL_COMMUNITY): Payer: 59

## 2016-09-08 ENCOUNTER — Encounter (HOSPITAL_COMMUNITY): Payer: Self-pay

## 2016-09-08 VITALS — BP 116/79 | HR 109 | Temp 98.2°F | Resp 16

## 2016-09-08 DIAGNOSIS — C155 Malignant neoplasm of lower third of esophagus: Secondary | ICD-10-CM | POA: Diagnosis not present

## 2016-09-08 DIAGNOSIS — Z452 Encounter for adjustment and management of vascular access device: Secondary | ICD-10-CM | POA: Diagnosis present

## 2016-09-08 DIAGNOSIS — C787 Secondary malignant neoplasm of liver and intrahepatic bile duct: Secondary | ICD-10-CM

## 2016-09-08 MED ORDER — HEPARIN SOD (PORK) LOCK FLUSH 100 UNIT/ML IV SOLN
INTRAVENOUS | Status: AC
  Filled 2016-09-08: qty 5

## 2016-09-08 MED ORDER — HEPARIN SOD (PORK) LOCK FLUSH 100 UNIT/ML IV SOLN
500.0000 [IU] | Freq: Once | INTRAVENOUS | Status: AC | PRN
  Administered 2016-09-08: 500 [IU]

## 2016-09-08 MED ORDER — SODIUM CHLORIDE 0.9% FLUSH
10.0000 mL | INTRAVENOUS | Status: DC | PRN
  Administered 2016-09-08: 10 mL
  Filled 2016-09-08: qty 10

## 2016-09-08 NOTE — Progress Notes (Signed)
Patient presented today to disconnect his 5FU pump. Patient reported no complaints. Pump disconnected and flushed port per protocol. VSS, patient discharged self ambulatory from clinic.

## 2016-09-08 NOTE — Patient Instructions (Signed)
[place] at [facility] Discharge Instructions  RECOMMENDATIONS MADE BY THE CONSULTANT AND ANY TEST RESULTS WILL BE SENT TO YOUR REFERRING PHYSICIAN.  5FU pump disconnected. Port flushed  Follow up as scheduled.  Thank you for choosing [place] at [facility] to provide your oncology and hematology care.  To afford each patient quality time with our provider, please arrive at least 15 minutes before your scheduled appointment time.   Beginning January 23rd 2017 lab work for the [facility] will be done in the  Main lab at [facility] on 1st floor. If you have a lab appointment with the [facility] please come in thru the  Main Entrance and check in at the main information desk  You need to re-schedule your appointment should you arrive 10 or more minutes late.  We strive to give you quality time with our providers, and arriving late affects you and other patients whose appointments are after yours.  Also, if you no show three or more times for appointments you may be dismissed from the clinic at the providers discretion.     Again, thank you for choosing [facility].  Our hope is that these requests will decrease the amount of time that you wait before being seen by our physicians.       _____________________________________________________________  Should you have questions after your visit to [facility], please contact our office at [phone] between the hours of 8:30 a.m. and 4:30 p.m.  Voicemails left after 4:30 p.m. will not be returned until the following business day.  For prescription refill requests, have your pharmacy contact our office.         Resources For Cancer Patients and their Caregivers ? American Cancer Society: Can assist with transportation, wigs, general needs, runs Look Good Feel Better.        251-626-8736 ? Cancer Care: Provides financial assistance, online support groups,  medication/co-pay assistance.  1-800-813-HOPE (253) 030-3206) ? [place] Assists [place] cancer patients and their families through emotional , educational and financial support.  ([phone] ? [place] DSS Where to apply for food stamps, Medicaid and utility assistance. 414-072-5712 ? RCATS: Transportation to medical appointments. 703-061-7375 ? Social Security Administration: May apply for disability if have a Stage IV cancer. 779-088-9692 [phone])058-8491 ? [employer], Disability and Transit Services: Assists with nutrition, care and transit needs. [place] Support Programs: '@10RELATIVEDAYS'$ @ > Cancer Support Group  2nd Tuesday of the month 1pm-2pm, Journey Room  > Creative Journey  3rd Tuesday of the month 1130am-1pm, Journey Room  > Look Good Feel Better  1st Wednesday of the month 10am-12 noon, Journey Room (Call [place] to register [phone])

## 2016-09-15 ENCOUNTER — Ambulatory Visit (INDEPENDENT_AMBULATORY_CARE_PROVIDER_SITE_OTHER): Payer: Medicaid Other | Admitting: Family Medicine

## 2016-09-15 ENCOUNTER — Encounter: Payer: Self-pay | Admitting: Family Medicine

## 2016-09-15 VITALS — BP 116/64 | HR 110 | Resp 18 | Ht 73.25 in | Wt 154.0 lb

## 2016-09-15 DIAGNOSIS — E119 Type 2 diabetes mellitus without complications: Secondary | ICD-10-CM

## 2016-09-15 DIAGNOSIS — Z23 Encounter for immunization: Secondary | ICD-10-CM

## 2016-09-15 DIAGNOSIS — E43 Unspecified severe protein-calorie malnutrition: Secondary | ICD-10-CM

## 2016-09-15 DIAGNOSIS — C155 Malignant neoplasm of lower third of esophagus: Secondary | ICD-10-CM

## 2016-09-15 DIAGNOSIS — Z7189 Other specified counseling: Secondary | ICD-10-CM

## 2016-09-15 DIAGNOSIS — F1010 Alcohol abuse, uncomplicated: Secondary | ICD-10-CM

## 2016-09-15 DIAGNOSIS — Z7689 Persons encountering health services in other specified circumstances: Secondary | ICD-10-CM

## 2016-09-15 MED ORDER — METFORMIN HCL 500 MG PO TABS: 500.0000 mg | ORAL_TABLET | Freq: Two times a day (BID) | ORAL | 3 refills | Status: DC

## 2016-09-15 NOTE — Progress Notes (Addendum)
Chief Complaint  Patient presents with  . Establish Care    referred by Dr. Whitney Muse    Very pleasant 55 year old gentleman here for his initial visit to establish care. He informs me that he is very soon moving to [place].  I offered to evaluate him today, and assist in transfer to a primary care doctor closer to his new home. I told we will be happy to follow him until he establishes elsewhere. He has metastatic esophageal cancer, is under the care of oncology with palliative chemotherapy. He has scans scheduled for early next week to assess his response to treatment. He complains of abdominal pain. He has pain medicine prescribed he is unable to afford secondary to lack of insurance. He tells me his oncology providers helping him to get insurance and I will see what assistance is available for him. He may need a referral to social services The patient is a long-standing alcoholic. He had esophageal varices on endoscopy. He continues to drink alcohol. He was prescribed  disulfiram but he chose not to get filled. He continues to drink alcohol. I have advised that he make every effort to quit his alcohol habit. He lives with his girlfriend here in [place] when he is getting cancer treatment, but spends the rest of this time in [place] with his mother. He states his family and his fiance are supportive of his cancer treatment and care. Currently not working because of his cancer treatment status. The patient has been diagnosed with new diabetes. He has had sugars in the 2-300 range. Hemoglobin A1c was 9.4. Diabetes runs in his family. He states he has never been told he had diabetes previously. He is having polydipsia, polyuria. At times he has blurred vision. He has had some weight loss but this was thought to be due to his cancer chemotherapy. We discussed diabetes management. He is agreeable to starting medication. We discussed his health maintenance. He is due for immunizations. Flu shot  and Prevnar are due today. He states he has had a tetanus shot lately. I will try to find this in the medical record. He has not had a colonoscopy. Given his extensive cancer and palliative status I do not think this is appropriate at this time.   Patient Active Problem List   Diagnosis Date Noted  . Thrush of mouth and esophagus ([place]) 08/11/2016  . Bacterial intestinal infection 07/13/2016  . Liver metastases ([place]) 05/29/2016  . Esophageal cancer ([place]) 05/22/2016  . Alcohol abuse 08/02/2015  . Erectile dysfunction 08/02/2015    Outpatient Encounter Prescriptions as of 09/15/2016  Medication Sig  . fentaNYL (DURAGESIC - DOSED MCG/HR) 12 MCG/HR Place 1 patch (12.5 mcg total) onto the skin every 3 (three) days.  . fentaNYL (DURAGESIC - DOSED MCG/HR) 25 MCG/HR patch Place 1 patch (25 mcg total) onto the skin every 3 (three) days.  . fluorouracil CALGB 45409 in sodium chloride 0.9 % 150 mL Inject into the vein. To start 06/05/16. To be given every 14 days. To infuse over 46 hours  . furosemide (LASIX) 20 MG tablet Take 1 tablet (20 mg total) by mouth daily.  Marland Kitchen leucovorin 50 MG injection Inject into the vein daily. To start 06/05/16. To be given every 14 days.  Marland Kitchen lidocaine-prilocaine (EMLA) cream Apply a quarter size amount to port site 1 hour prior to chemo. Do not rub in. Cover with plastic wrap.  . metFORMIN (GLUCOPHAGE) 500 MG tablet Take 1 tablet (500 mg total) by mouth 2 (two)  times daily with a meal.  . morphine (MSIR) 15 MG tablet Take 1 tablet (15 mg total) by mouth every 6 (six) hours as needed for severe pain.  Marland Kitchen ondansetron (ZOFRAN) 8 MG tablet Take 1 tablet (8 mg total) by mouth every 8 (eight) hours as needed for nausea or vomiting.  . OXALIPLATIN IV Inject into the vein. To start 06/05/16. To be given every 14 days.  . potassium chloride SA (K-DUR,KLOR-CON) 20 MEQ tablet Take 3 tablets (60 mEq total) by mouth daily.  . prochlorperazine (COMPAZINE) 10 MG tablet Take 1 tablet (10 mg  total) by mouth every 6 (six) hours as needed for nausea or vomiting.  . sildenafil (REVATIO) 20 MG tablet Take 2-5 tablets as needed for sexual activity  . benzonatate (TESSALON PERLES) 100 MG capsule Take 2 capsules (200 mg total) by mouth 3 (three) times daily as needed for cough. (Patient not taking: Reported on 09/15/2016)  . dextromethorphan-guaiFENesin (MUCINEX DM) 30-600 MG 12hr tablet Take 1 tablet by mouth 2 (two) times daily. (Patient not taking: Reported on 09/15/2016)  . disulfiram (ANTABUSE) 250 MG tablet Take 1 tablet (250 mg total) by mouth daily. (Patient not taking: Reported on 09/15/2016)  . Disulfiram 500 MG TABS Take 1 tablet (500 mg total) by mouth daily. (Patient not taking: Reported on 09/15/2016)  . loperamide (IMODIUM A-D) 2 MG tablet Take 1 tablet (2 mg total) by mouth 4 (four) times daily as needed for diarrhea or loose stools. (Patient not taking: Reported on 09/15/2016)  Patient not taking highlighted medications   Past Medical History:  Diagnosis Date  . Alcohol abuse    been through rehab  . Allergy   . Anemia   . Asthma    childhood  . Chicken pox   . Chronic bronchitis ([place])   . Diabetes mellitus without complication ([place])   . Erectile dysfunction   . Esophageal cancer ([place]) 05/22/2016  . History of stomach ulcers   . Hypertension     Past Surgical History:  Procedure Laterality Date  . APPENDECTOMY  1978  . ESOPHAGOGASTRODUODENOSCOPY N/A 05/25/2016   Procedure: ESOPHAGOGASTRODUODENOSCOPY (EGD);  Surgeon: Daneil Dolin, MD;  Location: [facility];  Service: Endoscopy;  Laterality: N/A;  . HERNIA REPAIR      Social History   Social History  . Marital status: Single    Spouse name: N/A  . Number of children: 1  . Years of education: 42   Occupational History  . cook     disabled   Social History Main Topics  . Smoking status: Never Smoker  . Smokeless tobacco: Current User    Types: Chew     Comment: 5 years  . Alcohol use Yes      Comment: occ  . Drug use: No  . Sexual activity: Yes   Other Topics Concern  . Not on file   Social History Narrative   Lives with mother, stays with fiancee during chemotherapy and medical treatments.       Family History  Problem Relation Age of Onset  . Hypertension Mother   . Diabetes Mother   . Cancer Neg Hx   . Heart disease Neg Hx   . Stroke Neg Hx     Review of Systems  Constitutional: Positive for malaise/fatigue and weight loss. Negative for chills and fever.  HENT: Negative for congestion.        Needs dental repair  Eyes: Positive for blurred vision. Negative for pain.  Respiratory:  Negative for cough, shortness of breath and wheezing.   Cardiovascular: Positive for leg swelling. Negative for chest pain.       Leg swelling improved  Gastrointestinal: Positive for abdominal pain, diarrhea and nausea. Negative for constipation and heartburn.       Intermittent diarrhea with chemotherapy  Genitourinary: Negative for dysuria and frequency.  Musculoskeletal: Negative for falls, joint pain and myalgias.  Neurological: Positive for weakness. Negative for dizziness, seizures and headaches.  Endo/Heme/Allergies: Positive for environmental allergies.  Psychiatric/Behavioral: Negative for depression. The patient is not nervous/anxious and does not have insomnia.     BP 116/64   Pulse (!) 110   Resp 18   Ht 6' 1.25" (1.861 m)   Wt 154 lb (69.9 kg)   SpO2 97%   BMI 20.18 kg/m   Physical Exam  Constitutional: He is oriented to person, place, and time. He appears well-developed.  Cachectic, thin  HENT:  Head: Normocephalic and atraumatic.  Right Ear: External ear normal.  Left Ear: External ear normal.  Mouth/Throat: Oropharynx is clear and moist.  Poor dental repair  Eyes: Conjunctivae are normal. Pupils are equal, round, and reactive to light.  Neck: Normal range of motion. No thyromegaly present.  Cardiovascular: Regular rhythm and normal heart sounds.   No  murmur heard. Tachycardia  Pulmonary/Chest: Effort normal and breath sounds normal. No respiratory distress. He has no wheezes.  Abdominal: Soft. Bowel sounds are normal. He exhibits distension. There is tenderness.  Distended. Ventral hernia. Diffusely tender. Hepatomegaly.  Musculoskeletal: Normal range of motion. He exhibits edema.  1+ pitting edema  Lymphadenopathy:    He has no cervical adenopathy.  Neurological: He is alert and oriented to person, place, and time. He has normal reflexes.  Psychiatric: He has a normal mood and affect. His behavior is normal. Judgment and thought content normal.  ASSESSMENT/PLAN:   1. Malignant neoplasm of lower third of esophagus (HCC) Under care of oncology for palliative chemotherapy. Metastases to liver and lungs and nodes.  2. Protein-calorie malnutrition ([place]) Refer to nutrition. Protein supplementation discussed  3. Diabetes mellitus, new onset [facility]) Discussed with patient. Goal is to manage diabetes to keep him out of trouble and without symptoms, but not to strictly control. I'm not starting him on daily fingersticks. We discussed that once he stabilizes with his cancer treatment, we may want to attempt better control of diabetes, however, the goal is to prevent current symptoms are not long-term complications. Discussed metformin. If he has nausea or diarrhea he will call and we will try another agent. - Amb ref to Medical Nutrition Therapy-MNT  4. Encounter to establish care with new doctor Discussed. Preventative care discussed. Flu and pneumonia shots today.  5. Alcohol abuse Patient is encouraged to stop drinking   Patient Instructions  Flu shot and pneumonia shot today  Try to stay away from alcohol  I will call about medicaid  You have diabetes The goal is to control symptoms without strict management yet Take the metformin twice a day with food Call for side effects, nausea, diarrhea I have referred to nutrition  specialist.  You and your fiancee need both to go.  Come see me in a couple of weeks.  You can coordinate with Dr Whitney Muse   Raylene Everts, MD

## 2016-09-15 NOTE — Patient Instructions (Addendum)
Flu shot and pneumonia shot today  Try to stay away from alcohol  I will call about medicaid  You have diabetes The goal is to control symptoms without strict management yet Take the metformin twice a day with food Call for side effects, nausea, diarrhea I have referred to nutrition specialist.  You and your fiancee need both to go.  Come see me in a couple of weeks.  You can coordinate with Dr Whitney Muse

## 2016-09-18 ENCOUNTER — Ambulatory Visit (HOSPITAL_COMMUNITY)
Admission: RE | Admit: 2016-09-18 | Discharge: 2016-09-18 | Disposition: A | Discharge status: Home / Self Care | Payer: Medicaid Other | Source: Ambulatory Visit | Attending: Oncology | Admitting: Oncology

## 2016-09-18 DIAGNOSIS — K769 Liver disease, unspecified: Secondary | ICD-10-CM | POA: Insufficient documentation

## 2016-09-18 DIAGNOSIS — C155 Malignant neoplasm of lower third of esophagus: Secondary | ICD-10-CM | POA: Diagnosis not present

## 2016-09-18 DIAGNOSIS — D739 Disease of spleen, unspecified: Secondary | ICD-10-CM | POA: Diagnosis not present

## 2016-09-18 DIAGNOSIS — R918 Other nonspecific abnormal finding of lung field: Secondary | ICD-10-CM | POA: Diagnosis not present

## 2016-09-18 DIAGNOSIS — C7951 Secondary malignant neoplasm of bone: Secondary | ICD-10-CM | POA: Insufficient documentation

## 2016-09-18 DIAGNOSIS — R188 Other ascites: Secondary | ICD-10-CM | POA: Insufficient documentation

## 2016-09-18 MED ORDER — IOPAMIDOL (ISOVUE-300) INJECTION 61%
100.0000 mL | Freq: Once | INTRAVENOUS | Status: AC | PRN
  Administered 2016-09-18: 100 mL via INTRAVENOUS

## 2016-09-20 ENCOUNTER — Encounter (HOSPITAL_BASED_OUTPATIENT_CLINIC_OR_DEPARTMENT_OTHER): Payer: Medicaid Other

## 2016-09-20 ENCOUNTER — Encounter (HOSPITAL_BASED_OUTPATIENT_CLINIC_OR_DEPARTMENT_OTHER): Payer: Medicaid Other | Admitting: Oncology

## 2016-09-20 ENCOUNTER — Encounter (HOSPITAL_COMMUNITY): Payer: Self-pay | Admitting: Oncology

## 2016-09-20 VITALS — BP 116/82 | HR 97 | Temp 98.4°F | Resp 16 | Ht 74.0 in | Wt 156.0 lb

## 2016-09-20 VITALS — BP 120/72 | HR 94 | Temp 98.2°F | Resp 18

## 2016-09-20 DIAGNOSIS — Z5111 Encounter for antineoplastic chemotherapy: Secondary | ICD-10-CM

## 2016-09-20 DIAGNOSIS — C159 Malignant neoplasm of esophagus, unspecified: Secondary | ICD-10-CM | POA: Diagnosis not present

## 2016-09-20 DIAGNOSIS — C787 Secondary malignant neoplasm of liver and intrahepatic bile duct: Secondary | ICD-10-CM

## 2016-09-20 DIAGNOSIS — E876 Hypokalemia: Secondary | ICD-10-CM

## 2016-09-20 DIAGNOSIS — C155 Malignant neoplasm of lower third of esophagus: Secondary | ICD-10-CM

## 2016-09-20 DIAGNOSIS — E119 Type 2 diabetes mellitus without complications: Secondary | ICD-10-CM

## 2016-09-20 LAB — CBC WITH DIFFERENTIAL/PLATELET
BASOS ABS: 0 10*3/uL (ref 0.0–0.1)
Basophils Relative: 0 %
EOS PCT: 1 %
Eosinophils Absolute: 0.1 10*3/uL (ref 0.0–0.7)
HCT: 35.3 % — ABNORMAL LOW (ref 39.0–52.0)
Hemoglobin: 11.7 g/dL — ABNORMAL LOW (ref 13.0–17.0)
LYMPHS ABS: 2.5 10*3/uL (ref 0.7–4.0)
Lymphocytes Relative: 27 %
MCH: 33.6 pg (ref 26.0–34.0)
MCHC: 33.1 g/dL (ref 30.0–36.0)
MCV: 101.4 fL — AB (ref 78.0–100.0)
MONO ABS: 1.4 10*3/uL — AB (ref 0.1–1.0)
Monocytes Relative: 15 %
NEUTROS ABS: 5.3 10*3/uL (ref 1.7–7.7)
Neutrophils Relative %: 57 %
PLATELETS: 107 10*3/uL — AB (ref 150–400)
RBC: 3.48 MIL/uL — AB (ref 4.22–5.81)
RDW: 17.9 % — AB (ref 11.5–15.5)
WBC: 9.3 10*3/uL (ref 4.0–10.5)

## 2016-09-20 LAB — COMPREHENSIVE METABOLIC PANEL
ALT: 23 U/L (ref 17–63)
AST: 46 U/L — ABNORMAL HIGH (ref 15–41)
Albumin: 2.6 g/dL — ABNORMAL LOW (ref 3.5–5.0)
Alkaline Phosphatase: 190 U/L — ABNORMAL HIGH (ref 38–126)
Anion gap: 7 (ref 5–15)
BUN: 6 mg/dL (ref 6–20)
CHLORIDE: 100 mmol/L — AB (ref 101–111)
CO2: 27 mmol/L (ref 22–32)
CREATININE: 0.46 mg/dL — AB (ref 0.61–1.24)
Calcium: 8.4 mg/dL — ABNORMAL LOW (ref 8.9–10.3)
Glucose, Bld: 197 mg/dL — ABNORMAL HIGH (ref 65–99)
POTASSIUM: 3.1 mmol/L — AB (ref 3.5–5.1)
SODIUM: 134 mmol/L — AB (ref 135–145)
Total Bilirubin: 2.5 mg/dL — ABNORMAL HIGH (ref 0.3–1.2)
Total Protein: 6.9 g/dL (ref 6.5–8.1)

## 2016-09-20 LAB — MAGNESIUM: MAGNESIUM: 1.5 mg/dL — AB (ref 1.7–2.4)

## 2016-09-20 MED ORDER — DEXTROSE 5 % IV SOLN
Freq: Once | INTRAVENOUS | Status: AC
  Administered 2016-09-20: 11:00:00 via INTRAVENOUS

## 2016-09-20 MED ORDER — SODIUM CHLORIDE 0.9 % IV SOLN
1680.0000 mg/m2 | INTRAVENOUS | Status: DC
  Administered 2016-09-20: 3400 mg via INTRAVENOUS
  Filled 2016-09-20: qty 68

## 2016-09-20 MED ORDER — MAGNESIUM SULFATE 50 % IJ SOLN
2.0000 g | Freq: Once | INTRAVENOUS | Status: AC
  Administered 2016-09-20: 2 g via INTRAVENOUS
  Filled 2016-09-20: qty 4

## 2016-09-20 MED ORDER — PALONOSETRON HCL INJECTION 0.25 MG/5ML
0.2500 mg | Freq: Once | INTRAVENOUS | Status: AC
  Administered 2016-09-20: 0.25 mg via INTRAVENOUS
  Filled 2016-09-20: qty 5

## 2016-09-20 MED ORDER — SODIUM CHLORIDE 0.9% FLUSH
10.0000 mL | INTRAVENOUS | Status: DC | PRN
  Administered 2016-09-20: 10 mL
  Filled 2016-09-20: qty 10

## 2016-09-20 MED ORDER — OXALIPLATIN CHEMO INJECTION 100 MG/20ML
65.0000 mg/m2 | Freq: Once | INTRAVENOUS | Status: AC
  Administered 2016-09-20: 130 mg via INTRAVENOUS
  Filled 2016-09-20: qty 26

## 2016-09-20 MED ORDER — SODIUM CHLORIDE 0.9 % IV SOLN
12.0000 mg | Freq: Once | INTRAVENOUS | Status: AC
  Administered 2016-09-20: 12 mg via INTRAVENOUS
  Filled 2016-09-20: qty 1.2

## 2016-09-20 MED ORDER — DEXTROSE 5 % IV SOLN
400.0000 mg/m2 | Freq: Once | INTRAVENOUS | Status: AC
  Administered 2016-09-20: 804 mg via INTRAVENOUS
  Filled 2016-09-20: qty 25

## 2016-09-20 MED ORDER — POTASSIUM CHLORIDE CRYS ER 20 MEQ PO TBCR
60.0000 meq | EXTENDED_RELEASE_TABLET | Freq: Once | ORAL | Status: AC
  Administered 2016-09-20: 60 meq via ORAL
  Filled 2016-09-20: qty 3

## 2016-09-20 NOTE — Patient Instructions (Signed)
[facility] Discharge Instructions for Patients Receiving Chemotherapy   Beginning January 23rd 2017 lab work for the [facility] will be done in the  Main lab at [facility] on 1st floor. If you have a lab appointment with the [facility] please come in thru the  Main Entrance and check in at the main information desk   Today you received the following chemotherapy agents Oxaliplatin,Leucovorin and 5FU as well as Magnesium IV. Follow-up as scheduled. Call clinic for any questions or concerns  To help prevent nausea and vomiting after your treatment, we encourage you to take your nausea medication   If you develop nausea and vomiting, or diarrhea that is not controlled by your medication, call the clinic.  The clinic phone number is [phone]. Office hours are Monday-Friday 8:30am-5:00pm.  BELOW ARE SYMPTOMS THAT SHOULD BE REPORTED IMMEDIATELY:  *FEVER GREATER THAN 101.0 F  *CHILLS WITH OR WITHOUT FEVER  NAUSEA AND VOMITING THAT IS NOT CONTROLLED WITH YOUR NAUSEA MEDICATION  *UNUSUAL SHORTNESS OF BREATH  *UNUSUAL BRUISING OR BLEEDING  TENDERNESS IN MOUTH AND THROAT WITH OR WITHOUT PRESENCE OF ULCERS  *URINARY PROBLEMS  *BOWEL PROBLEMS  UNUSUAL RASH Items with * indicate a potential emergency and should be followed up as soon as possible. If you have an emergency after office hours please contact your primary care physician or go to the nearest emergency department.  Please call the clinic during office hours if you have any questions or concerns.   You may also contact the Patient Navigator at [phone] should you have any questions or need assistance in obtaining follow up care.      Resources For Cancer Patients and their Caregivers ? American Cancer Society: Can assist with transportation, wigs, general needs, runs Look Good Feel Better.        (785)032-7934 ? Cancer Care: Provides financial assistance, online support groups,  medication/co-pay assistance.  1-800-813-HOPE 470-753-4446) ? [facility] Assists [place] cancer patients and their families through emotional , educational and financial support.  (606)094-6626 ? [place] DSS Where to apply for food stamps, Medicaid and utility assistance. (575)829-6824 ? [facility]: Transportation to medical appointments. (779) 415-4563 ? Social Security Administration: May apply for disability if have a Stage IV cancer. 814-709-4037 516-462-2479 ? [employer], Disability and Transit Services: Assists with nutrition, care and transit needs. 6810556080

## 2016-09-20 NOTE — Progress Notes (Signed)
Joshua Silversmith, NP [address] [place] [address]  Malignant neoplasm of lower third of esophagus ([place]) - Plan: Magnesium  CURRENT THERAPY: FOLFOX beginning on 05/31/2016  INTERVAL HISTORY: Joshua Greene 55 y.o. 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    Esophageal cancer ([place])   05/04/2016 Miscellaneous    ED Visit secondary to bilateral ankle swelling      05/09/2016 Imaging    Enlarged liver with innumerable hepatic hypodense lesions most c/w metastatic disease. Diffuse mesenteric edema as well as multiple enlarged mesenteric and retroperitoneal LN      05/15/2016 Imaging    Two posterior RUL nodules, indeterminate, 17 mm short axis RL paraesophageal node worrisome for nodal metastasis. Site of primary remains indeterminate      05/16/2016 Initial Biopsy    Ultrasound guided biopsy of R sided liver lesion      05/16/2016 Pathology Results    Metastatic poorly differentiated carcinoma, immunoprofile not entirely specific. Favor poorly differentiated squamous cell carcinoma,       05/25/2016 Procedure    EGD with Dr. Gala Romney with esophageal varices, area of punch out ulceration that appears to be neoplastic      05/25/2016 Pathology Results    The neoplasm stained positive for CK5/6, p63 (squamous cell markers), MOC-31, CK7 (patchy). CDX-2 (weak), TTF-1 (non specific stain), and negative stains for CK20 and [place] A. The immunostain pattern and morphologyfavored poorly diff sq cell carcinoma esoph      05/29/2016 Procedure    Port-A-Cath placed by interventional radiology      05/31/2016 -  Chemotherapy    FOLFOX      07/12/2016 Miscellaneous    Dx with Enteropathogenic E coli.  Treated with Cipro x 10 days      09/18/2016 Imaging    CT CAP- Interval decrease in size of previously described right upper lobe pulmonary nodules. The liver is overall shrunken in  appearance when compared to prior examination and lobular in contour, suggestive of treatment response of multiple hepatic lesions. Given the overall heterogeneity of the hepatic parenchyma, individual lesions are difficult to identify. Consider further evaluation with MRI as clinically indicated. Re- demonstrated sclerotic metastasis involving the spinous process of T1 and the left aspect of the sternum. Re- demonstrated multiple low-attenuation splenic lesions. Recommend attention on followup. Interval development of circumferential wall thickening of the cecum, ascending and transverse colon which may be secondary to an infectious or inflammatory process. New small volume perihepatic and right paracolic gutter ascites.       He is doing well.  He has established care with primary care provider.  He received influenza vaccine and pneumonia immunization recently.  He denies any pain.  He did note intolerance to CT oral contrast with typical reaction with diarrhea and bloating.  That has since resolved.  This is certainly not a contraindication to further PO contrast when needed.  He is ready to move his care to [facility].  He notes that his bedroom is already set-up at his mother's house in [place].  He has no current bowel or BM complaints.  Review of Systems  Constitutional: Negative.  Negative for chills, fever and weight loss.  HENT: Negative.   Eyes: Negative.   Respiratory: Negative.  Negative for cough and shortness of breath.   Cardiovascular: Negative.  Negative for chest pain.  Gastrointestinal: Negative.  Negative for constipation, diarrhea, nausea  and vomiting.  Genitourinary: Negative.   Musculoskeletal: Negative.   Skin: Negative.   Neurological: Negative.  Negative for weakness.  Endo/Heme/Allergies: Negative.   Psychiatric/Behavioral: Negative.      Past Medical History:  Diagnosis Date  . Alcohol abuse    been through rehab  . Allergy   . Anemia   .  Asthma    childhood  . Chicken pox   . Chronic bronchitis ([place])   . Diabetes mellitus without complication ([place])   . Erectile dysfunction   . Esophageal cancer ([place]) 05/22/2016  . History of stomach ulcers   . Hypertension     Past Surgical History:  Procedure Laterality Date  . APPENDECTOMY  1978  . ESOPHAGOGASTRODUODENOSCOPY N/A 05/25/2016   Procedure: ESOPHAGOGASTRODUODENOSCOPY (EGD);  Surgeon: Daneil Dolin, MD;  Location: [facility];  Service: Endoscopy;  Laterality: N/A;  . HERNIA REPAIR      Family History  Problem Relation Age of Onset  . Hypertension Mother   . Diabetes Mother   . Cancer Neg Hx   . Heart disease Neg Hx   . Stroke Neg Hx     Social History   Social History  . Marital status: Single    Spouse name: N/A  . Number of children: 1  . Years of education: 69   Occupational History  . cook     disabled   Social History Main Topics  . Smoking status: Never Smoker  . Smokeless tobacco: Current User    Types: Chew     Comment: 5 years  . Alcohol use Yes     Comment: occ  . Drug use: No  . Sexual activity: Yes   Other Topics Concern  . None   Social History Narrative   Lives with mother, stays with fiancee during chemotherapy and medical treatments.        PHYSICAL EXAMINATION  ECOG PERFORMANCE STATUS: 1 - Symptomatic but completely ambulatory  Vitals:   09/20/16 0916  BP: 116/82  Pulse: 97  Resp: 16  Temp: 98.4 F (36.9 C)    Vitals - 1 value per visit 8/92/1194  SYSTOLIC 174  DIASTOLIC 87  Pulse 88  Temperature 98.4  Respirations 18     GENERAL:alert, cachectic, smiling, appearing much improved, unaccompanied, and in chemo-bed SKIN: skin color, texture, turgor are normal, no rashes or significant lesions HEAD: Normocephalic EYES: Conjunctiva are pink and non-injected EARS: External ears normal  OROPHARYNX:lips, buccal mucosa, and tongue normal and mucous membranes are moist  NECK: supple, trachea midline LYMPH:  not  examined BREAST:not examined LUNGS: clear to auscultation and percussion HEART: regular rate & rhythm without murmur, rub, or gallop. ABDOMEN:abdomen soft, distended, normal bowel sounds and hepatomegaly noted. BACK: Back symmetric, no curvature. EXTREMITIES:less then 2 second capillary refill, no skin discoloration, no cyanosis NEURO: alert & oriented x 3 with fluent speech, no focal motor/sensory deficits, gait normal   LABORATORY DATA: CBC    Component Value Date/Time   WBC 10.0 09/06/2016 1017   RBC 3.46 (L) 09/06/2016 1017   HGB 11.3 (L) 09/06/2016 1017   HGB 15.1 12/03/2014 1205   HCT 33.9 (L) 09/06/2016 1017   HCT 45.7 12/03/2014 1205   PLT 133 (L) 09/06/2016 1017   PLT 262 12/03/2014 1205   MCV 98.0 09/06/2016 1017   MCV 96 12/03/2014 1205   MCH 32.7 09/06/2016 1017   MCHC 33.3 09/06/2016 1017   RDW 19.2 (H) 09/06/2016 1017   RDW 13.3 12/03/2014 1205  LYMPHSABS 2.8 09/06/2016 1017   LYMPHSABS 1.0 01/14/2014 2000   MONOABS 2.3 (H) 09/06/2016 1017   MONOABS 1.0 01/14/2014 2000   EOSABS 0.0 09/06/2016 1017   EOSABS 0.0 01/14/2014 2000   BASOSABS 0.0 09/06/2016 1017   BASOSABS 0.0 01/14/2014 2000      Chemistry      Component Value Date/Time   NA 135 09/06/2016 1017   NA 137 12/03/2014 1205   K 3.7 09/06/2016 1017   K 3.8 12/03/2014 1205   CL 102 09/06/2016 1017   CL 97 (L) 12/03/2014 1205   CO2 26 09/06/2016 1017   CO2 30 12/03/2014 1205   BUN 9 09/06/2016 1017   BUN 8 12/03/2014 1205   CREATININE 0.46 (L) 09/06/2016 1017   CREATININE 0.69 12/03/2014 1205      Component Value Date/Time   CALCIUM 8.6 (L) 09/06/2016 1017   CALCIUM 9.1 12/03/2014 1205   ALKPHOS 238 (H) 09/06/2016 1017   ALKPHOS 71 12/03/2014 1205   AST 67 (H) 09/06/2016 1017   AST 51 (H) 12/03/2014 1205   ALT 32 09/06/2016 1017   ALT 42 12/03/2014 1205   BILITOT 2.3 (H) 09/06/2016 1017   BILITOT 0.9 12/03/2014 1205        PENDING LABS:   RADIOGRAPHIC STUDIES:  Ct Chest W  Contrast  Result Date: 09/18/2016 CLINICAL DATA:  Patient with history of esophageal carcinoma diagnosed 04/2016. Restaging evaluation on chemotherapy. EXAM: CT CHEST, ABDOMEN AND PELVIS WITHOUT CONTRAST TECHNIQUE: Multidetector CT imaging of the chest, abdomen and pelvis was performed following the standard protocol without IV contrast. COMPARISON:  PET-CT 05/26/2016. Chest CT 05/15/2016; abdomen pelvis CT 05/09/2016. FINDINGS: CT CHEST FINDINGS Cardiovascular: Right anterior chest wall Port-A-Cath is present with tip terminating in the superior vena cava. Normal heart size. Coronary arterial vascular calcifications. Aorta and main pulmonary artery are normal in caliber. Mediastinum/Nodes: Interval decrease in size of right paraesophageal lymph node measuring 0.7 cm (image 40; series 2), previously 1.7 cm. Additionally, significant interval decrease in size of previously described right hilar soft tissue, nearly resolved (image 33; series 2). Persistent mild wall thickening of the distal esophagus. Lungs/Pleura: Central airways are patent. Interval decrease in size of right upper lobe nodule measuring 0.4 x 0.3 cm (image 50; series 4), previously 0.6 x 0.5 cm. Near complete resolution of previously described adjacent 3 mm right upper lobe nodule (image 53; series 4). Interval resolution of previously described subpleural consolidation within the right lower lobe. No new or enlarging pulmonary nodules or masses identified. No pleural effusion or pneumothorax. Biapical pleural parenchymal thickening and scarring. Musculoskeletal: Sclerosis involving the posterior spinous process of T1 (image 106; series 6). 11 mm sclerotic lesion involving the left aspect of the sternum (image 31; series 2). CT ABDOMEN PELVIS FINDINGS Hepatobiliary: The liver is markedly heterogeneous in attenuation. The liver is shrunken in size and lobular in contour. Given the heterogeneity of the hepatic parenchyma, many of the liver lesions  are difficult to identify. Reference lesion within the central upper measures 1.5 x 1.5 cm (image 76; series 5), previously 4.3 x 3.5 cm. There are multiple small low-attenuation lesions demonstrated throughout the liver. Gallbladder is decompressed. Pancreas: Unremarkable Spleen: Re- demonstrated innumerable low-attenuation lesions throughout the spleen, measuring up to 1.2 cm (image 51; series 2), grossly unchanged. Adrenals/Urinary Tract: The adrenal glands are normal. Kidneys enhance symmetrically with contrast. No hydronephrosis. Stable sub cm partially exophytic low-attenuation lesion off the inferior pole of the right kidney. Urinary bladder is unremarkable. Stomach/Bowel:  There is circumferential wall thickening of the cecum, ascending and transverse colon. Oral contrast material throughout the small bowel. No evidence for bowel obstruction. No free intraperitoneal air. Normal morphology of the stomach. Vascular/Lymphatic: Peripheral calcified atherosclerotic plaque. No retroperitoneal lymphadenopathy. Reproductive: Unremarkable Other: Small volume of ascites, predominantly within the perihepatic location and right paracolic gutter. Musculoskeletal: Lumbar spine degenerative changes. No aggressive or acute appearing osseous lesions. IMPRESSION: Interval decrease in size of previously described right upper lobe pulmonary nodules. The liver is overall shrunken in appearance when compared to prior examination and lobular in contour, suggestive of treatment response of multiple hepatic lesions. Given the overall heterogeneity of the hepatic parenchyma, individual lesions are difficult to identify. Consider further evaluation with MRI as clinically indicated. Re- demonstrated sclerotic metastasis involving the spinous process of T1 and the left aspect of the sternum. Re- demonstrated multiple low-attenuation splenic lesions. Recommend attention on followup. Interval development of circumferential wall thickening  of the cecum, ascending and transverse colon which may be secondary to an infectious or inflammatory process. New small volume perihepatic and right paracolic gutter ascites. These results will be called to the ordering clinician or representative by the Radiologist Assistant, and communication documented in the PACS or zVision Dashboard. Electronically Signed   By: Lovey Newcomer M.D.   On: 09/18/2016 10:02   Ct Abdomen Pelvis W Contrast  Result Date: 09/18/2016 CLINICAL DATA:  Patient with history of esophageal carcinoma diagnosed 04/2016. Restaging evaluation on chemotherapy. EXAM: CT CHEST, ABDOMEN AND PELVIS WITHOUT CONTRAST TECHNIQUE: Multidetector CT imaging of the chest, abdomen and pelvis was performed following the standard protocol without IV contrast. COMPARISON:  PET-CT 05/26/2016. Chest CT 05/15/2016; abdomen pelvis CT 05/09/2016. FINDINGS: CT CHEST FINDINGS Cardiovascular: Right anterior chest wall Port-A-Cath is present with tip terminating in the superior vena cava. Normal heart size. Coronary arterial vascular calcifications. Aorta and main pulmonary artery are normal in caliber. Mediastinum/Nodes: Interval decrease in size of right paraesophageal lymph node measuring 0.7 cm (image 40; series 2), previously 1.7 cm. Additionally, significant interval decrease in size of previously described right hilar soft tissue, nearly resolved (image 33; series 2). Persistent mild wall thickening of the distal esophagus. Lungs/Pleura: Central airways are patent. Interval decrease in size of right upper lobe nodule measuring 0.4 x 0.3 cm (image 50; series 4), previously 0.6 x 0.5 cm. Near complete resolution of previously described adjacent 3 mm right upper lobe nodule (image 53; series 4). Interval resolution of previously described subpleural consolidation within the right lower lobe. No new or enlarging pulmonary nodules or masses identified. No pleural effusion or pneumothorax. Biapical pleural parenchymal  thickening and scarring. Musculoskeletal: Sclerosis involving the posterior spinous process of T1 (image 106; series 6). 11 mm sclerotic lesion involving the left aspect of the sternum (image 31; series 2). CT ABDOMEN PELVIS FINDINGS Hepatobiliary: The liver is markedly heterogeneous in attenuation. The liver is shrunken in size and lobular in contour. Given the heterogeneity of the hepatic parenchyma, many of the liver lesions are difficult to identify. Reference lesion within the central upper measures 1.5 x 1.5 cm (image 76; series 5), previously 4.3 x 3.5 cm. There are multiple small low-attenuation lesions demonstrated throughout the liver. Gallbladder is decompressed. Pancreas: Unremarkable Spleen: Re- demonstrated innumerable low-attenuation lesions throughout the spleen, measuring up to 1.2 cm (image 51; series 2), grossly unchanged. Adrenals/Urinary Tract: The adrenal glands are normal. Kidneys enhance symmetrically with contrast. No hydronephrosis. Stable sub cm partially exophytic low-attenuation lesion off the inferior pole of the right  kidney. Urinary bladder is unremarkable. Stomach/Bowel: There is circumferential wall thickening of the cecum, ascending and transverse colon. Oral contrast material throughout the small bowel. No evidence for bowel obstruction. No free intraperitoneal air. Normal morphology of the stomach. Vascular/Lymphatic: Peripheral calcified atherosclerotic plaque. No retroperitoneal lymphadenopathy. Reproductive: Unremarkable Other: Small volume of ascites, predominantly within the perihepatic location and right paracolic gutter. Musculoskeletal: Lumbar spine degenerative changes. No aggressive or acute appearing osseous lesions. IMPRESSION: Interval decrease in size of previously described right upper lobe pulmonary nodules. The liver is overall shrunken in appearance when compared to prior examination and lobular in contour, suggestive of treatment response of multiple hepatic  lesions. Given the overall heterogeneity of the hepatic parenchyma, individual lesions are difficult to identify. Consider further evaluation with MRI as clinically indicated. Re- demonstrated sclerotic metastasis involving the spinous process of T1 and the left aspect of the sternum. Re- demonstrated multiple low-attenuation splenic lesions. Recommend attention on followup. Interval development of circumferential wall thickening of the cecum, ascending and transverse colon which may be secondary to an infectious or inflammatory process. New small volume perihepatic and right paracolic gutter ascites. These results will be called to the ordering clinician or representative by the Radiologist Assistant, and communication documented in the PACS or zVision Dashboard. Electronically Signed   By: Lovey Newcomer M.D.   On: 09/18/2016 10:02     PATHOLOGY:    ASSESSMENT AND PLAN:  Esophageal cancer ([place]) Stage IV poorly differentiated squamous cell carcinoma of the esophagus with significant liver metastases.  Began palliative systemic chemotherapy on 05/31/2016 with FOLFOX.  Oncotype ID was sent off and reported back as Sarcoma.  This was discussed in [employer]  and pathology is very confident, as are we that we are not dealing with a sarcoma.  Oncology history updated.  Labs today: CBC diff, CMET, Magnesium.  I personally reviewed and went over laboratory results with the patient.  The results are noted within this dictation.  Labs satisfy treatment parameters.    Fasting glucose and HGB A1C demonstrate diabetes.  He has met with Dr. Meda Coffee, primary care provider.  Her note is reviewed in detail.  He is now on Metformin for glucose control.  CT CAP w contrast is reviewed in detail.  I personally reviewed and went over radiographic studies with the patient.  The results are noted within this dictation.  CT scan demonstrates a response to therapy thus far.  I will update Dr. Rogue Bussing accordingly and  work on transitioning his cancer care to [place], [facility], within the next 2-4 weeks.  He received his influenza and pneumonia immunization by primary care provider.  He reports no pain when asked today.  He will return in 2 weeks for his next cycle of chemotherapy unless he is able to transition his cancer care to [facility] in the interim.    ORDERS PLACED FOR THIS ENCOUNTER: Orders Placed This Encounter  Procedures  . Magnesium    MEDICATIONS PRESCRIBED THIS ENCOUNTER: No orders of the defined types were placed in this encounter.   THERAPY PLAN:  Continue treatment as planned with supportive care.  All questions were answered. The patient knows to call the clinic with any problems, questions or concerns. We can certainly see the patient much sooner if necessary.  Patient and plan discussed with Dr. Ancil Linsey and she is in agreement with the aforementioned.   This note is electronically signed by: Doy Mince 09/20/2016 9:40 AM

## 2016-09-20 NOTE — Progress Notes (Signed)
Jamarrion Iser tolerated chemo tx well without complaints or incident. Labs reviewed with Kirby Crigler PA and orders obtained for Magnesium 2 grams IV,  K-DUR 60 meq PO and to continue home Potassium 20 meq PO TID. Pt instructed in above orders and verbalized understanding. Pt discharged with 5FU pump infusing without issues. VSS upon discharge. Pt discharged self ambulatory in satisfactory condition

## 2016-09-20 NOTE — Assessment & Plan Note (Addendum)
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  Began palliative systemic chemotherapy on 05/31/2016 with FOLFOX.  Oncotype ID was sent off and reported back as Sarcoma.  This was discussed in [employer]  and pathology is very confident, as are we that we are not dealing with a sarcoma.  Oncology history updated.  Labs today: CBC diff, CMET, Magnesium.  I personally reviewed and went over laboratory results with the patient.  The results are noted within this dictation.  Labs satisfy treatment parameters.  Hypokalemia is noted and 60 mEq of Kdur is ordered for in the clinic.  He will continue with Kdur TID at home.  I suspect noncompliance.  Hypomagnesemia is noted as well.  2 g of IV magnesium sulfate is ordered as well.  Fasting glucose and HGB A1C demonstrate diabetes.  He has met with Dr. Meda Coffee, primary care provider.  Her note is reviewed in detail.  He is now on Metformin for glucose control.  CT CAP w contrast is reviewed in detail.  I personally reviewed and went over radiographic studies with the patient.  The results are noted within this dictation.  CT scan demonstrates a response to therapy thus far.  I will update Dr. Rogue Bussing accordingly and work on transitioning his cancer care to [place], [facility], within the next 2-4 weeks.  He received his influenza and pneumonia immunization by primary care provider.  He reports no pain when asked today.  He will return in 2 weeks for his next cycle of chemotherapy followed by a transition of medical oncology care to Dr. Rogue Bussing in 4 weeks per discussion with Dr. Rogue Bussing.  Referral is ordered for this transfer of medical care.

## 2016-09-21 ENCOUNTER — Other Ambulatory Visit: Payer: Self-pay | Admitting: *Deleted

## 2016-09-21 DIAGNOSIS — C155 Malignant neoplasm of lower third of esophagus: Secondary | ICD-10-CM

## 2016-09-22 ENCOUNTER — Telehealth: Payer: Self-pay | Admitting: *Deleted

## 2016-09-22 ENCOUNTER — Encounter (HOSPITAL_BASED_OUTPATIENT_CLINIC_OR_DEPARTMENT_OTHER): Payer: Medicaid Other

## 2016-09-22 VITALS — BP 116/73 | HR 103 | Temp 98.5°F | Resp 20

## 2016-09-22 DIAGNOSIS — Z452 Encounter for adjustment and management of vascular access device: Secondary | ICD-10-CM | POA: Diagnosis present

## 2016-09-22 DIAGNOSIS — C787 Secondary malignant neoplasm of liver and intrahepatic bile duct: Secondary | ICD-10-CM | POA: Diagnosis not present

## 2016-09-22 DIAGNOSIS — C155 Malignant neoplasm of lower third of esophagus: Secondary | ICD-10-CM | POA: Diagnosis not present

## 2016-09-22 MED ORDER — HEPARIN SOD (PORK) LOCK FLUSH 100 UNIT/ML IV SOLN
INTRAVENOUS | Status: AC
  Filled 2016-09-22: qty 5

## 2016-09-22 MED ORDER — SODIUM CHLORIDE 0.9% FLUSH
10.0000 mL | INTRAVENOUS | Status: DC | PRN
  Administered 2016-09-22: 10 mL
  Filled 2016-09-22: qty 10

## 2016-09-22 MED ORDER — HEPARIN SOD (PORK) LOCK FLUSH 100 UNIT/ML IV SOLN
500.0000 [IU] | Freq: Once | INTRAVENOUS | Status: AC | PRN
  Administered 2016-09-22: 500 [IU]

## 2016-09-22 NOTE — Progress Notes (Signed)
Joshua Greene tolerated 5FU pump well without complaints. 5FU pump discontinued today with port flushed with 10 ml NS and 5 ml Heparin easily.VSS. Pt discharged self ambulatory in satisfactory condition

## 2016-09-22 NOTE — Telephone Encounter (Signed)
Needed appt set up. States already discussed

## 2016-09-22 NOTE — Patient Instructions (Signed)
[place] at [facility] Discharge Instructions  RECOMMENDATIONS MADE BY THE CONSULTANT AND ANY TEST RESULTS WILL BE SENT TO YOUR REFERRING PHYSICIAN.  5FU pump discontinued today with port flushed per protocol. Follow-up as scheduled. Call clinic for any questions or concerns  Thank you for choosing [place] at [facility] to provide your oncology and hematology care.  To afford each patient quality time with our provider, please arrive at least 15 minutes before your scheduled appointment time.   Beginning January 23rd 2017 lab work for the [facility] will be done in the  Main lab at [facility] on 1st floor. If you have a lab appointment with the [place] please come in thru the  Main Entrance and check in at the main information desk  You need to re-schedule your appointment should you arrive 10 or more minutes late.  We strive to give you quality time with our providers, and arriving late affects you and other patients whose appointments are after yours.  Also, if you no show three or more times for appointments you may be dismissed from the clinic at the providers discretion.     Again, thank you for choosing [facility].  Our hope is that these requests will decrease the amount of time that you wait before being seen by our physicians.       _____________________________________________________________  Should you have questions after your visit to [facility], please contact our office at [phone] between the hours of 8:30 a.m. and 4:30 p.m.  Voicemails left after 4:30 p.m. will not be returned until the following business day.  For prescription refill requests, have your pharmacy contact our office.         Resources For Cancer Patients and their Caregivers ? American Cancer Society: Can assist with transportation, wigs, general needs, runs Look Good Feel Better.         361-461-0460 ? Cancer Care: Provides financial assistance, online support groups, medication/co-pay assistance.  1-800-813-HOPE 505-874-4141) ? [place] Assists [place] cancer patients and their families through emotional , educational and financial support.  747-229-6919 ? [place] DSS Where to apply for food stamps, Medicaid and utility assistance. 504-832-4715 ? [facility]: Transportation to medical appointments. 8783623051 ? Social Security Administration: May apply for disability if have a Stage IV cancer. 715-443-2494 9592424824 ? [employer], Disability and Transit Services: Assists with nutrition, care and transit needs. [employer] Support Programs: '@10RELATIVEDAYS'$ @ > Cancer Support Group  2nd Tuesday of the month 1pm-2pm, Journey Room  > Creative Journey  3rd Tuesday of the month 1130am-1pm, Journey Room  > Look Good Feel Better  1st Wednesday of the month 10am-12 noon, Journey Room (Call [place] to register [phone])

## 2016-09-30 DIAGNOSIS — C787 Secondary malignant neoplasm of liver and intrahepatic bile duct: Secondary | ICD-10-CM | POA: Diagnosis not present

## 2016-09-30 DIAGNOSIS — C159 Malignant neoplasm of esophagus, unspecified: Secondary | ICD-10-CM | POA: Diagnosis not present

## 2016-10-03 ENCOUNTER — Ambulatory Visit: Payer: Self-pay | Admitting: Nutrition

## 2016-10-03 ENCOUNTER — Other Ambulatory Visit (HOSPITAL_COMMUNITY): Payer: Self-pay | Admitting: Pharmacist

## 2016-10-04 ENCOUNTER — Encounter (HOSPITAL_BASED_OUTPATIENT_CLINIC_OR_DEPARTMENT_OTHER): Payer: Medicaid Other

## 2016-10-04 ENCOUNTER — Encounter (HOSPITAL_COMMUNITY): Payer: Medicaid Other | Attending: Hematology & Oncology | Admitting: Hematology & Oncology

## 2016-10-04 ENCOUNTER — Ambulatory Visit (INDEPENDENT_AMBULATORY_CARE_PROVIDER_SITE_OTHER): Payer: Medicaid Other | Admitting: Family Medicine

## 2016-10-04 ENCOUNTER — Encounter: Payer: Self-pay | Admitting: Family Medicine

## 2016-10-04 ENCOUNTER — Encounter (HOSPITAL_COMMUNITY): Payer: Self-pay | Admitting: Hematology & Oncology

## 2016-10-04 VITALS — BP 124/80 | HR 111 | Ht 74.0 in | Wt 156.1 lb

## 2016-10-04 VITALS — BP 114/84 | HR 87 | Temp 98.1°F | Resp 18 | Wt 154.0 lb

## 2016-10-04 VITALS — BP 138/90 | HR 90 | Temp 98.1°F | Resp 18

## 2016-10-04 DIAGNOSIS — J42 Unspecified chronic bronchitis: Secondary | ICD-10-CM | POA: Insufficient documentation

## 2016-10-04 DIAGNOSIS — Z8719 Personal history of other diseases of the digestive system: Secondary | ICD-10-CM | POA: Insufficient documentation

## 2016-10-04 DIAGNOSIS — Z833 Family history of diabetes mellitus: Secondary | ICD-10-CM | POA: Insufficient documentation

## 2016-10-04 DIAGNOSIS — R64 Cachexia: Secondary | ICD-10-CM | POA: Diagnosis not present

## 2016-10-04 DIAGNOSIS — F1010 Alcohol abuse, uncomplicated: Secondary | ICD-10-CM | POA: Diagnosis not present

## 2016-10-04 DIAGNOSIS — R490 Dysphonia: Secondary | ICD-10-CM | POA: Diagnosis not present

## 2016-10-04 DIAGNOSIS — N529 Male erectile dysfunction, unspecified: Secondary | ICD-10-CM | POA: Diagnosis not present

## 2016-10-04 DIAGNOSIS — Z7289 Other problems related to lifestyle: Secondary | ICD-10-CM

## 2016-10-04 DIAGNOSIS — E119 Type 2 diabetes mellitus without complications: Secondary | ICD-10-CM

## 2016-10-04 DIAGNOSIS — C159 Malignant neoplasm of esophagus, unspecified: Secondary | ICD-10-CM | POA: Diagnosis not present

## 2016-10-04 DIAGNOSIS — Z79899 Other long term (current) drug therapy: Secondary | ICD-10-CM | POA: Insufficient documentation

## 2016-10-04 DIAGNOSIS — Z5111 Encounter for antineoplastic chemotherapy: Secondary | ICD-10-CM | POA: Diagnosis not present

## 2016-10-04 DIAGNOSIS — E118 Type 2 diabetes mellitus with unspecified complications: Secondary | ICD-10-CM

## 2016-10-04 DIAGNOSIS — C787 Secondary malignant neoplasm of liver and intrahepatic bile duct: Secondary | ICD-10-CM | POA: Diagnosis not present

## 2016-10-04 DIAGNOSIS — Z823 Family history of stroke: Secondary | ICD-10-CM | POA: Insufficient documentation

## 2016-10-04 DIAGNOSIS — C155 Malignant neoplasm of lower third of esophagus: Secondary | ICD-10-CM

## 2016-10-04 DIAGNOSIS — G893 Neoplasm related pain (acute) (chronic): Secondary | ICD-10-CM | POA: Insufficient documentation

## 2016-10-04 DIAGNOSIS — Z8249 Family history of ischemic heart disease and other diseases of the circulatory system: Secondary | ICD-10-CM | POA: Diagnosis not present

## 2016-10-04 DIAGNOSIS — Z809 Family history of malignant neoplasm, unspecified: Secondary | ICD-10-CM | POA: Diagnosis not present

## 2016-10-04 DIAGNOSIS — I10 Essential (primary) hypertension: Secondary | ICD-10-CM | POA: Diagnosis not present

## 2016-10-04 DIAGNOSIS — Z9889 Other specified postprocedural states: Secondary | ICD-10-CM | POA: Diagnosis not present

## 2016-10-04 DIAGNOSIS — E43 Unspecified severe protein-calorie malnutrition: Secondary | ICD-10-CM

## 2016-10-04 DIAGNOSIS — Z720 Tobacco use: Secondary | ICD-10-CM | POA: Insufficient documentation

## 2016-10-04 DIAGNOSIS — Z789 Other specified health status: Secondary | ICD-10-CM

## 2016-10-04 LAB — CBC WITH DIFFERENTIAL/PLATELET
Basophils Absolute: 0.1 10*3/uL (ref 0.0–0.1)
Basophils Relative: 1 %
EOS PCT: 1 %
Eosinophils Absolute: 0.1 10*3/uL (ref 0.0–0.7)
HEMATOCRIT: 34.8 % — AB (ref 39.0–52.0)
HEMOGLOBIN: 11.5 g/dL — AB (ref 13.0–17.0)
LYMPHS ABS: 2.2 10*3/uL (ref 0.7–4.0)
LYMPHS PCT: 26 %
MCH: 33.4 pg (ref 26.0–34.0)
MCHC: 33 g/dL (ref 30.0–36.0)
MCV: 101.2 fL — AB (ref 78.0–100.0)
MONOS PCT: 21 %
Monocytes Absolute: 1.8 10*3/uL — ABNORMAL HIGH (ref 0.1–1.0)
NEUTROS ABS: 4.4 10*3/uL (ref 1.7–7.7)
Neutrophils Relative %: 51 %
Platelets: 112 10*3/uL — ABNORMAL LOW (ref 150–400)
RBC: 3.44 MIL/uL — AB (ref 4.22–5.81)
RDW: 16.4 % — AB (ref 11.5–15.5)
WBC: 8.6 10*3/uL (ref 4.0–10.5)

## 2016-10-04 LAB — COMPREHENSIVE METABOLIC PANEL
ALBUMIN: 2.8 g/dL — AB (ref 3.5–5.0)
ALK PHOS: 192 U/L — AB (ref 38–126)
ALT: 25 U/L (ref 17–63)
AST: 55 U/L — AB (ref 15–41)
Anion gap: 5 (ref 5–15)
BILIRUBIN TOTAL: 1.5 mg/dL — AB (ref 0.3–1.2)
BUN: 6 mg/dL (ref 6–20)
CALCIUM: 8.7 mg/dL — AB (ref 8.9–10.3)
CO2: 27 mmol/L (ref 22–32)
Chloride: 101 mmol/L (ref 101–111)
Creatinine, Ser: 0.37 mg/dL — ABNORMAL LOW (ref 0.61–1.24)
GFR calc Af Amer: >60 mL/min (ref >60)
GLUCOSE: 111 mg/dL — AB (ref 65–99)
POTASSIUM: 3.6 mmol/L (ref 3.5–5.1)
Sodium: 133 mmol/L — ABNORMAL LOW (ref 135–145)
TOTAL PROTEIN: 7 g/dL (ref 6.5–8.1)

## 2016-10-04 MED ORDER — DEXTROSE 5 % IV SOLN
Freq: Once | INTRAVENOUS | Status: AC
  Administered 2016-10-04: 11:00:00 via INTRAVENOUS

## 2016-10-04 MED ORDER — HEPARIN SOD (PORK) LOCK FLUSH 100 UNIT/ML IV SOLN
500.0000 [IU] | Freq: Once | INTRAVENOUS | Status: DC | PRN
  Filled 2016-10-04: qty 5

## 2016-10-04 MED ORDER — SODIUM CHLORIDE 0.9% FLUSH: 10.0000 mL | INTRAVENOUS | Status: DC | PRN

## 2016-10-04 MED ORDER — MORPHINE SULFATE 15 MG PO TABS: 15.0000 mg | ORAL_TABLET | Freq: Four times a day (QID) | ORAL | 0 refills | Status: DC | PRN

## 2016-10-04 MED ORDER — PALONOSETRON HCL INJECTION 0.25 MG/5ML
INTRAVENOUS | Status: AC
  Filled 2016-10-04: qty 5

## 2016-10-04 MED ORDER — PALONOSETRON HCL INJECTION 0.25 MG/5ML
0.2500 mg | Freq: Once | INTRAVENOUS | Status: AC
  Administered 2016-10-04: 0.25 mg via INTRAVENOUS

## 2016-10-04 MED ORDER — OXALIPLATIN CHEMO INJECTION 100 MG/20ML
70.0000 mg/m2 | Freq: Once | INTRAVENOUS | Status: AC
  Administered 2016-10-04: 140 mg via INTRAVENOUS
  Filled 2016-10-04: qty 28

## 2016-10-04 MED ORDER — LEUCOVORIN CALCIUM INJECTION 350 MG
400.0000 mg/m2 | Freq: Once | INTRAVENOUS | Status: AC
  Administered 2016-10-04: 804 mg via INTRAVENOUS
  Filled 2016-10-04: qty 25

## 2016-10-04 MED ORDER — SODIUM CHLORIDE 0.9 % IV SOLN
12.0000 mg | Freq: Once | INTRAVENOUS | Status: AC
  Administered 2016-10-04: 12 mg via INTRAVENOUS
  Filled 2016-10-04: qty 1.2

## 2016-10-04 MED ORDER — FLUOROURACIL CHEMO INJECTION 5 GM/100ML
1800.0000 mg/m2 | INTRAVENOUS | Status: DC
  Administered 2016-10-04: 3600 mg via INTRAVENOUS
  Filled 2016-10-04: qty 72

## 2016-10-04 NOTE — Patient Instructions (Addendum)
[place] at [facility] Discharge Instructions  RECOMMENDATIONS MADE BY THE CONSULTANT AND ANY TEST RESULTS WILL BE SENT TO YOUR REFERRING PHYSICIAN.  You saw Dr. Whitney Muse today. Follow up in 2 weeks for labs and chemo. Follow up in 4 weeks with MD, labs and chemo.  Thank you for choosing [place] at [facility] to provide your oncology and hematology care.  To afford each patient quality time with our provider, please arrive at least 15 minutes before your scheduled appointment time.   Beginning January 23rd 2017 lab work for the [facility] will be done in the  Main lab at [facility] on 1st floor. If you have a lab appointment with the [place] [facility] please come in thru the  Main Entrance and check in at the main information desk  You need to re-schedule your appointment should you arrive 10 or more minutes late.  We strive to give you quality time with our providers, and arriving late affects you and other patients whose appointments are after yours.  Also, if you no show three or more times for appointments you may be dismissed from the clinic at the providers discretion.     Again, thank you for choosing [facility].  Our hope is that these requests will decrease the amount of time that you wait before being seen by our physicians.       _____________________________________________________________  Should you have questions after your visit to [facility], please contact our office at [phone] between the hours of 8:30 a.m. and 4:30 p.m.  Voicemails left after 4:30 p.m. will not be returned until the following business day.  For prescription refill requests, have your pharmacy contact our office.         Resources For Cancer Patients and their Caregivers ? American Cancer Society: Can assist with transportation, wigs, general needs, runs Look Good Feel Better.        (225)174-4562 ? Cancer  Care: Provides financial assistance, online support groups, medication/co-pay assistance.  1-800-813-HOPE 413-477-3843) ? [place] Assists [place] cancer patients and their families through emotional , educational and financial support.  814-089-4133 ? [place] DSS Where to apply for food stamps, Medicaid and utility assistance. 401-118-3272 ? [employer]: Transportation to medical appointments. 205-572-8860 ? Social Security Administration: May apply for disability if have a Stage IV cancer. 404 882 1693 415-317-0940 ? [employer], Disability and Transit Services: Assists with nutrition, care and transit needs. [employer] Support Programs: '@10RELATIVEDAYS'$ @ > Cancer Support Group  2nd Tuesday of the month 1pm-2pm, Journey Room  > Creative Journey  3rd Tuesday of the month 1130am-1pm, Journey Room  > Look Good Feel Better  1st Wednesday of the month 10am-12 noon, Journey Room (Call [place] to register [phone])

## 2016-10-04 NOTE — Progress Notes (Signed)
[place]  Progress Note  Patient Care Team: Jearld Fenton, NP as PCP - General (Internal Medicine)  CHIEF COMPLAINTS:    Esophageal cancer ([place])   05/04/2016 Miscellaneous    ED Visit secondary to bilateral ankle swelling      05/09/2016 Imaging    Enlarged liver with innumerable hepatic hypodense lesions most c/w metastatic disease. Diffuse mesenteric edema as well as multiple enlarged mesenteric and retroperitoneal LN      05/15/2016 Imaging    Two posterior RUL nodules, indeterminate, 17 mm short axis RL paraesophageal node worrisome for nodal metastasis. Site of primary remains indeterminate      05/16/2016 Initial Biopsy    Ultrasound guided biopsy of R sided liver lesion      05/16/2016 Pathology Results    Metastatic poorly differentiated carcinoma, immunoprofile not entirely specific. Favor poorly differentiated squamous cell carcinoma,       05/25/2016 Procedure    EGD with Dr. Gala Romney with esophageal varices, area of punch out ulceration that appears to be neoplastic      05/25/2016 Pathology Results    The neoplasm stained positive for CK5/6, p63 (squamous cell markers), MOC-31, CK7 (patchy). CDX-2 (weak), TTF-1 (non specific stain), and negative stains for CK20 and Nap A. The immunostain pattern and morphologyfavored poorly diff sq cell carcinoma esoph      05/29/2016 Procedure    Port-A-Cath placed by interventional radiology      05/31/2016 -  Chemotherapy    FOLFOX      07/12/2016 Miscellaneous    Dx with Enteropathogenic E coli.  Treated with Cipro x 10 days      09/18/2016 Imaging    CT CAP- Interval decrease in size of previously described right upper lobe pulmonary nodules. The liver is overall shrunken in appearance when compared to prior examination and lobular in contour, suggestive of treatment response of multiple hepatic lesions. Given the overall heterogeneity of the hepatic parenchyma, individual lesions are difficult to  identify. Consider further evaluation with MRI as clinically indicated. Re- demonstrated sclerotic metastasis involving the spinous process of T1 and the left aspect of the sternum. Re- demonstrated multiple low-attenuation splenic lesions. Recommend attention on followup. Interval development of circumferential wall thickening of the cecum, ascending and transverse colon which may be secondary to an infectious or inflammatory process. New small volume perihepatic and right paracolic gutter ascites.       HISTORY OF PRESENTING ILLNESS:  Joshua Greene 55 y.o. 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 He has significant liver metastases. He is on dose reduced FOLFOX. CT imaging has shown improvement Clinically he is improved.   Joshua Greene return to the [facility] unaccompanied and presents in treatment bed. He presents for ongoing FOLFOX, Cycle #10.   He has been feeling and doing better. He continues to intermittent RUQ  pain.  He has been doing better with his drinking and continues to drink less. Alcohol however remains an issue.   He misses working. Admits this has changed his attitude and he sees fewer people, "It's a big difference".   He has noticed darkening skin on his hands. He denies numbness or tingling in his hands or feet.   He denies diarrhea or bowel issues.  Admits he doesn't eat a lot but he does eat. He has been eating better than he was. He denies vomiting or nausea. States food still has a taste.  He denies any  recent swelling in his legs or feet. He has been able to wear his church shoes again. He attends a church in [place]. He goes on a regular basis. He does his ADL's without difficulty.   MEDICAL HISTORY:  Past Medical History:  Diagnosis Date  . Alcohol abuse    been through rehab  . Allergy   . Anemia   . Asthma    childhood  . Chicken pox   . Chronic bronchitis ([place])   .  Diabetes mellitus without complication ([place])   . Erectile dysfunction   . Esophageal cancer ([place]) 05/22/2016  . History of stomach ulcers   . Hypertension     SURGICAL HISTORY: Past Surgical History:  Procedure Laterality Date  . APPENDECTOMY  1978  . ESOPHAGOGASTRODUODENOSCOPY N/A 05/25/2016   Procedure: ESOPHAGOGASTRODUODENOSCOPY (EGD);  Surgeon: Daneil Dolin, MD;  Location: [facility] ENDO SUITE;  Service: Endoscopy;  Laterality: N/A;  . HERNIA REPAIR      SOCIAL HISTORY: Social History   Social History  . Marital status: Single    Spouse name: N/A  . Number of children: 1  . Years of education: 75   Occupational History  . cook     disabled   Social History Main Topics  . Smoking status: Never Smoker  . Smokeless tobacco: Current User    Types: Chew     Comment: 5 years  . Alcohol use Yes     Comment: occ  . Drug use: No  . Sexual activity: Yes   Other Topics Concern  . Not on file   Social History Narrative   Lives with mother, stays with fiancee during chemotherapy and medical treatments.      Fiance, engaged for 3 years and known each other their whole lives 1 son 1 grandchild Non smoker ETOH, a lot of beer. He has quit drinking for over a month.  Chews tobacco. He works in the [employer] and has for 3-4 months  FAMILY HISTORY: Family History  Problem Relation Age of Onset  . Hypertension Mother   . Diabetes Mother   . Cancer Neg Hx   . Heart disease Neg Hx   . Stroke Neg Hx    Mother is still living at 14 yo Father is still living at 7 yo 3 sisters and 1 brother, healthy  ALLERGIES:  has No Known Allergies.  MEDICATIONS:  Current Outpatient Prescriptions  Medication Sig Dispense Refill  . benzonatate (TESSALON PERLES) 100 MG capsule Take 2 capsules (200 mg total) by mouth 3 (three) times daily as needed for cough. (Patient not taking: Reported on 09/15/2016) 30 capsule 0  . dextromethorphan-guaiFENesin (MUCINEX DM) 30-600 MG 12hr  tablet Take 1 tablet by mouth 2 (two) times daily. (Patient not taking: Reported on 09/15/2016) 14 tablet 1  . disulfiram (ANTABUSE) 250 MG tablet Take 1 tablet (250 mg total) by mouth daily. (Patient not taking: Reported on 09/15/2016) 30 tablet 0  . Disulfiram 500 MG TABS Take 1 tablet (500 mg total) by mouth daily. (Patient not taking: Reported on 09/15/2016) 14 each 0  . fentaNYL (DURAGESIC - DOSED MCG/HR) 12 MCG/HR Place 1 patch (12.5 mcg total) onto the skin every 3 (three) days. 10 patch 0  . fentaNYL (DURAGESIC - DOSED MCG/HR) 25 MCG/HR patch Place 1 patch (25 mcg total) onto the skin every 3 (three) days. 10 patch 0  . fluorouracil CALGB 93235 in sodium chloride 0.9 % 150 mL Inject into the vein. To start 06/05/16.  To be given every 14 days. To infuse over 46 hours    . furosemide (LASIX) 20 MG tablet Take 1 tablet (20 mg total) by mouth daily. 30 tablet 1  . leucovorin 50 MG injection Inject into the vein daily. To start 06/05/16. To be given every 14 days.    Marland Kitchen lidocaine-prilocaine (EMLA) cream Apply a quarter size amount to port site 1 hour prior to chemo. Do not rub in. Cover with plastic wrap. 30 g 3  . loperamide (IMODIUM A-D) 2 MG tablet Take 1 tablet (2 mg total) by mouth 4 (four) times daily as needed for diarrhea or loose stools. (Patient not taking: Reported on 09/15/2016) 30 tablet 0  . metFORMIN (GLUCOPHAGE) 500 MG tablet Take 1 tablet (500 mg total) by mouth 2 (two) times daily with a meal. 180 tablet 3  . morphine (MSIR) 15 MG tablet Take 1 tablet (15 mg total) by mouth every 6 (six) hours as needed for severe pain. 60 tablet 0  . ondansetron (ZOFRAN) 8 MG tablet Take 1 tablet (8 mg total) by mouth every 8 (eight) hours as needed for nausea or vomiting. 30 tablet 2  . OXALIPLATIN IV Inject into the vein. To start 06/05/16. To be given every 14 days.    . potassium chloride SA (K-DUR,KLOR-CON) 20 MEQ tablet Take 3 tablets (60 mEq total) by mouth daily. 60 tablet 1  . prochlorperazine  (COMPAZINE) 10 MG tablet Take 1 tablet (10 mg total) by mouth every 6 (six) hours as needed for nausea or vomiting. 30 tablet 2  . sildenafil (REVATIO) 20 MG tablet Take 2-5 tablets as needed for sexual activity 50 tablet 0   No current facility-administered medications for this visit.    Facility-Administered Medications Ordered in Other Visits  Medication Dose Route Frequency Provider Last Rate Last Dose  . fluorouracil (ADRUCIL) 3,600 mg in sodium chloride 0.9 % 78 mL chemo infusion  1,800 mg/m2 (Treatment Plan Recorded) Intravenous 1 day or 1 dose Patrici Ranks, MD      . heparin lock flush 100 unit/mL  500 Units Intracatheter Once PRN Patrici Ranks, MD      . leucovorin 804 mg in dextrose 5 % 250 mL infusion  400 mg/m2 (Treatment Plan Recorded) Intravenous Once Patrici Ranks, MD      . oxaliplatin (ELOXATIN) 140 mg in dextrose 5 % 500 mL chemo infusion  70 mg/m2 (Treatment Plan Recorded) Intravenous Once Patrici Ranks, MD      . sodium chloride flush (NS) 0.9 % injection 10 mL  10 mL Intracatheter PRN Patrici Ranks, MD        Review of Systems  Constitutional: Positive for malaise/fatigue.  HENT: Negative.   Eyes: Negative.   Respiratory: Negative.   Cardiovascular: Negative for leg swelling.  Gastrointestinal: Positive for abdominal pain. Negative for diarrhea.  Genitourinary: Negative.   Musculoskeletal: Negative.   Skin: Negative.        Darkening skin on hands  Neurological: Negative for tingling and speech change.  Endo/Heme/Allergies: Negative.   Psychiatric/Behavioral: The patient has insomnia.   All other systems reviewed and are negative. 14 point ROS was done and is otherwise as detailed above or in HPI   PHYSICAL EXAMINATION: ECOG PERFORMANCE STATUS: 2 - Symptomatic, <50% confined to bed   Vitals with BMI 10/04/2016  Height   Weight 154 lbs  BMI   Systolic 272  Diastolic 84  Pulse 87  Respirations 18    Physical Exam  Constitutional:  He is oriented to person, place, and time.  Thin  HENT:  Head: Normocephalic and atraumatic.  Mouth/Throat: Oropharynx is clear and moist. No oropharyngeal exudate.  Eyes: Conjunctivae and EOM are normal. Pupils are equal, round, and reactive to light. Right eye exhibits no discharge. Left eye exhibits no discharge.  Neck: Normal range of motion. Neck supple. No tracheal deviation present. No thyromegaly present.  Cardiovascular: Normal rate, regular rhythm and normal heart sounds.   No murmur heard. Pulmonary/Chest: Effort normal and breath sounds normal. No respiratory distress. He has no wheezes. He has no rales. He exhibits no tenderness.  Abdominal: Soft. Bowel sounds are normal. He exhibits distension. There is no tenderness. There is no rebound.  Hepatomegaly improved  Musculoskeletal: Normal range of motion. He exhibits no edema.  Markedly improved   Lymphadenopathy:    He has no cervical adenopathy.  Neurological: He is alert and oriented to person, place, and time. No cranial nerve deficit. Gait normal. Coordination normal.  Skin: Skin is warm and dry. No rash noted. He is not diaphoretic. No erythema.  Psychiatric: Mood, memory, affect and judgment normal.  Nursing note and vitals reviewed.    LABORATORY DATA:  I have reviewed the data as listed Results for Joshua Greene, Joshua Greene (MRN [id number]) as of 10/04/2016 11:45  Ref. Range 10/04/2016 09:42  Sodium Latest Ref Range: 135 - 145 mmol/L 133 (L)  Potassium Latest Ref Range: 3.5 - 5.1 mmol/L 3.6  Chloride Latest Ref Range: 101 - 111 mmol/L 101  CO2 Latest Ref Range: 22 - 32 mmol/L 27  BUN Latest Ref Range: 6 - 20 mg/dL 6  Creatinine Latest Ref Range: 0.61 - 1.24 mg/dL 0.37 (L)  Calcium Latest Ref Range: 8.9 - 10.3 mg/dL 8.7 (L)  EGFR (Non-African Amer.) Latest Ref Range: >60 mL/min >60  EGFR (African American) Latest Ref Range: >60 mL/min >60  Glucose Latest Ref Range: 65 - 99 mg/dL 111 (H)  Anion gap Latest Ref Range: 5 -  15  5  Alkaline Phosphatase Latest Ref Range: 38 - 126 U/L 192 (H)  Albumin Latest Ref Range: 3.5 - 5.0 g/dL 2.8 (L)  AST Latest Ref Range: 15 - 41 U/L 55 (H)  ALT Latest Ref Range: 17 - 63 U/L 25  Total Protein Latest Ref Range: 6.5 - 8.1 g/dL 7.0  Total Bilirubin Latest Ref Range: 0.3 - 1.2 mg/dL 1.5 (H)  WBC Latest Ref Range: 4.0 - 10.5 K/uL 8.6  RBC Latest Ref Range: 4.22 - 5.81 MIL/uL 3.44 (L)  Hemoglobin Latest Ref Range: 13.0 - 17.0 g/dL 11.5 (L)  HCT Latest Ref Range: 39.0 - 52.0 % 34.8 (L)  MCV Latest Ref Range: 78.0 - 100.0 fL 101.2 (H)  MCH Latest Ref Range: 26.0 - 34.0 pg 33.4  MCHC Latest Ref Range: 30.0 - 36.0 g/dL 33.0  RDW Latest Ref Range: 11.5 - 15.5 % 16.4 (H)  Platelets Latest Ref Range: 150 - 400 K/uL 112 (L)  Neutrophils Latest Units: % 51  Lymphocytes Latest Units: % 26  Monocytes Relative Latest Units: % 21  Eosinophil Latest Units: % 1  Basophil Latest Units: % 1  NEUT# Latest Ref Range: 1.7 - 7.7 K/uL 4.4  Lymphocyte # Latest Ref Range: 0.7 - 4.0 K/uL 2.2  Monocyte # Latest Ref Range: 0.1 - 1.0 K/uL 1.8 (H)  Eosinophils Absolute Latest Ref Range: 0.0 - 0.7 K/uL 0.1  Basophils Absolute Latest Ref Range: 0.0 - 0.1 K/uL 0.1  Smear Review Unknown PLATELET COUNT  CO...     RADIOGRAPHIC STUDIES: I have personally reviewed the radiological images as listed and agreed with the findings in the report. No results found. Study Result   CLINICAL DATA:  Patient with history of esophageal carcinoma diagnosed 04/2016. Restaging evaluation on chemotherapy.  EXAM: CT CHEST, ABDOMEN AND PELVIS WITHOUT CONTRAST  TECHNIQUE: Multidetector CT imaging of the chest, abdomen and pelvis was performed following the standard protocol without IV contrast.  COMPARISON:  PET-CT 05/26/2016. Chest CT 05/15/2016; abdomen pelvis CT 05/09/2016.  FINDINGS: CT CHEST FINDINGS  Cardiovascular: Right anterior chest wall Port-A-Cath is present with tip terminating in the  superior vena cava. Normal heart size. Coronary arterial vascular calcifications. Aorta and main pulmonary artery are normal in caliber.  Mediastinum/Nodes: Interval decrease in size of right paraesophageal lymph node measuring 0.7 cm (image 40; series 2), previously 1.7 cm. Additionally, significant interval decrease in size of previously described right hilar soft tissue, nearly resolved (image 33; series 2). Persistent mild wall thickening of the distal esophagus.  Lungs/Pleura: Central airways are patent. Interval decrease in size of right upper lobe nodule measuring 0.4 x 0.3 cm (image 50; series 4), previously 0.6 x 0.5 cm. Near complete resolution of previously described adjacent 3 mm right upper lobe nodule (image 53; series 4). Interval resolution of previously described subpleural consolidation within the right lower lobe. No new or enlarging pulmonary nodules or masses identified. No pleural effusion or pneumothorax. Biapical pleural parenchymal thickening and scarring.  Musculoskeletal: Sclerosis involving the posterior spinous process of T1 (image 106; series 6). 11 mm sclerotic lesion involving the left aspect of the sternum (image 31; series 2).  CT ABDOMEN PELVIS FINDINGS  Hepatobiliary: The liver is markedly heterogeneous in attenuation. The liver is shrunken in size and lobular in contour. Given the heterogeneity of the hepatic parenchyma, many of the liver lesions are difficult to identify. Reference lesion within the central upper measures 1.5 x 1.5 cm (image 76; series 5), previously 4.3 x 3.5 cm. There are multiple small low-attenuation lesions demonstrated throughout the liver. Gallbladder is decompressed.  Pancreas: Unremarkable  Spleen: Re- demonstrated innumerable low-attenuation lesions throughout the spleen, measuring up to 1.2 cm (image 51; series 2), grossly unchanged.  Adrenals/Urinary Tract: The adrenal glands are normal. Kidneys enhance  symmetrically with contrast. No hydronephrosis. Stable sub cm partially exophytic low-attenuation lesion off the inferior pole of the right kidney. Urinary bladder is unremarkable.  Stomach/Bowel: There is circumferential wall thickening of the cecum, ascending and transverse colon. Oral contrast material throughout the small bowel. No evidence for bowel obstruction. No free intraperitoneal air. Normal morphology of the stomach.  Vascular/Lymphatic: Peripheral calcified atherosclerotic plaque. No retroperitoneal lymphadenopathy.  Reproductive: Unremarkable  Other: Small volume of ascites, predominantly within the perihepatic location and right paracolic gutter.  Musculoskeletal: Lumbar spine degenerative changes. No aggressive or acute appearing osseous lesions.  IMPRESSION: Interval decrease in size of previously described right upper lobe pulmonary nodules.  The liver is overall shrunken in appearance when compared to prior examination and lobular in contour, suggestive of treatment response of multiple hepatic lesions. Given the overall heterogeneity of the hepatic parenchyma, individual lesions are difficult to identify. Consider further evaluation with MRI as clinically indicated.  Re- demonstrated sclerotic metastasis involving the spinous process of T1 and the left aspect of the sternum.  Re- demonstrated multiple low-attenuation splenic lesions. Recommend attention on followup.  Interval development of circumferential wall thickening of the cecum, ascending and transverse colon which may be secondary to an infectious or  inflammatory process.  New small volume perihepatic and right paracolic gutter ascites.  These results will be called to the ordering clinician or representative by the Radiologist Assistant, and communication documented in the PACS or zVision Dashboard.   Electronically Signed   By: Lovey Newcomer M.D.   On: 09/18/2016 10:02       PATHOLOGY   ASSESSMENT & PLAN:  Widespread Metastatic carcinoma of esophageal primary  Liver metastases Cancer cachexia Cancer related Pain Tobacco use Alcohol use Thrombocytopenia Hyperglycemia, prob. Diabetes Uninsured status Hyperbilirubinemia Enteropathogenic E Coli, treated Anemia Macrocytosis Diabetes.   Will proceed with ongoing therapy with FOLFOX. No neuropathy noted by the patient.. I am really not sure how much he is drinking but we discussed his alcohol use and he was advised to discontinue. We discussed a multitude of reasons why he needs to quit.  CT imaging was reviewed again with the patient. Results are noted above.   He has seen Dr. Meda Coffee who has been managing the patient's diabetes.    Labs reviewed. Total bilirubin is 1.5 today. Based on improved bilirubin counts, I have increased his 5-FU dose. If his bilirubin continues to improve, I will continue to increase his treatment dosing. He will contact our office if he notices any changes or side effects.   I have refilled his pain medication.  He is scheduled to follow up with PCP on 10/04/2016.  He will return for treatment in 2 weeks and return for follow up in 1 month.   Orders Placed This Encounter  Procedures  . CBC with Differential    Standing Status:   Future    Standing Expiration Date:   10/04/2017  . Comprehensive metabolic panel    Standing Status:   Future    Standing Expiration Date:   10/04/2017  . CBC with Differential    Standing Status:   Future    Standing Expiration Date:   10/04/2017  . Comprehensive metabolic panel    Standing Status:   Future    Standing Expiration Date:   10/04/2017    All questions were answered. The patient knows to call the clinic with any problems, questions or concerns.  This document serves as a record of services personally performed by Ancil Linsey, MD. It was created on her behalf by Arlyce Harman, a trained medical scribe. The  creation of this record is based on the scribe's personal observations and the provider's statements to them. This document has been checked and approved by the attending provider.  I have reviewed the above documentation for accuracy and completeness and I agree with the above.  This note was electronically signed.    Molli Hazard, MD  10/04/2016 11:57 AM

## 2016-10-04 NOTE — Progress Notes (Signed)
Tolerated tx w/o adverse reaction.  Alert, in no distress.  VSS.  Discharged ambulatory. 

## 2016-10-04 NOTE — Patient Instructions (Signed)
[facility] Discharge Instructions for Patients Receiving Chemotherapy   Beginning January 23rd 2017 lab work for the [facility] will be done in the  Main lab at [facility] on 1st floor. If you have a lab appointment with the [place] please come in thru the  Main Entrance and check in at the main information desk   Today you received the following chemotherapy agents:  Oxaliplatin, leucovorin, and 57f.  If you develop nausea and vomiting, or diarrhea that is not controlled by your medication, call the clinic.  The clinic phone number is [phone] Office hours are Monday-Friday 8:30am-5:00pm.  BELOW ARE SYMPTOMS THAT SHOULD BE REPORTED IMMEDIATELY:  *FEVER GREATER THAN 101.0 F  *CHILLS WITH OR WITHOUT FEVER  NAUSEA AND VOMITING THAT IS NOT CONTROLLED WITH YOUR NAUSEA MEDICATION  *UNUSUAL SHORTNESS OF BREATH  *UNUSUAL BRUISING OR BLEEDING  TENDERNESS IN MOUTH AND THROAT WITH OR WITHOUT PRESENCE OF ULCERS  *URINARY PROBLEMS  *BOWEL PROBLEMS  UNUSUAL RASH Items with * indicate a potential emergency and should be followed up as soon as possible. If you have an emergency after office hours please contact your primary care physician or go to the nearest emergency department.  Please call the clinic during office hours if you have any questions or concerns.   You may also contact the Patient Navigator at [phone] you have any questions or need assistance in obtaining follow up care.      Resources For Cancer Patients and their Caregivers ? American Cancer Society: Can assist with transportation, wigs, general needs, runs Look Good Feel Better.        1573-203-1801? Cancer Care: Provides financial assistance, online support groups, medication/co-pay assistance.  1-800-813-HOPE (587 685 7540 ? [place] cancer patients and their families through emotional , educational and financial support.   3413-377-7567? [place] DSS Where to apply for food stamps, Medicaid and utility assistance. 3(929)185-4417? RCATS: Transportation to medical appointments. 3702-079-5513? Social Security Administration: May apply for disability if have a Stage IV cancer. [phone]? [employer] Disability and Transit Services: Assists with nutrition, care and transit needs. [phone]

## 2016-10-04 NOTE — Progress Notes (Signed)
Chief Complaint  Patient presents with  . Follow-up   He started on metformin Takes one a day No side effects His random sugar today is 111 He has noticed less urinary frequency and nocturia He has gained 2 lbs Has not yet seen nutrition  His cancer treatment is going well.  Repeat CT scans showed improvement Biggest problem is ongoing pain.  Cannot afford pain med.  Still no insurance.  Still drinking alcohol.  Buys himself one "tall boy" a day.  Does not buy 6 packs so will not have the temptation.  Says he is eating well.    Patient Active Problem List   Diagnosis Date Noted  . Thrush of mouth and esophagus ([place]) 08/11/2016  . Bacterial intestinal infection 07/13/2016  . Liver metastases ([place]) 05/29/2016  . Esophageal cancer ([place]) 05/22/2016  . Alcohol abuse 08/02/2015  . Erectile dysfunction 08/02/2015    Outpatient Encounter Prescriptions as of 10/04/2016  Medication Sig  . furosemide (LASIX) 20 MG tablet Take 1 tablet (20 mg total) by mouth daily.  Marland Kitchen leucovorin 50 MG injection Inject into the vein daily. To start 06/05/16. To be given every 14 days.  Marland Kitchen lidocaine-prilocaine (EMLA) cream Apply a quarter size amount to port site 1 hour prior to chemo. Do not rub in. Cover with plastic wrap.  . fluorouracil CALGB 00174 in sodium chloride 0.9 % 150 mL Inject into the vein. To start 06/05/16. To be given every 14 days. To infuse over 46 hours  . loperamide (IMODIUM A-D) 2 MG tablet Take 1 tablet (2 mg total) by mouth 4 (four) times daily as needed for diarrhea or loose stools. (Patient not taking: Reported on 09/15/2016)  . metFORMIN (GLUCOPHAGE) 500 MG tablet Take 1 tablet (500 mg total) by mouth 2 (two) times daily with a meal. (Patient not taking: Reported on 10/04/2016)  . morphine (MSIR) 15 MG tablet Take 1 tablet (15 mg total) by mouth every 6 (six) hours as needed for severe pain. (Patient not taking: Reported on 10/04/2016)  . ondansetron (ZOFRAN) 8 MG tablet Take 1  tablet (8 mg total) by mouth every 8 (eight) hours as needed for nausea or vomiting. (Patient not taking: Reported on 10/04/2016)  . OXALIPLATIN IV Inject into the vein. To start 06/05/16. To be given every 14 days.  . potassium chloride SA (K-DUR,KLOR-CON) 20 MEQ tablet Take 3 tablets (60 mEq total) by mouth daily. (Patient not taking: Reported on 10/04/2016)  . prochlorperazine (COMPAZINE) 10 MG tablet Take 1 tablet (10 mg total) by mouth every 6 (six) hours as needed for nausea or vomiting. (Patient not taking: Reported on 10/04/2016)  . sildenafil (REVATIO) 20 MG tablet Take 2-5 tablets as needed for sexual activity (Patient not taking: Reported on 10/04/2016)   Facility-Administered Encounter Medications as of 10/04/2016  Medication  . fluorouracil (ADRUCIL) 3,600 mg in sodium chloride 0.9 % 78 mL chemo infusion  . heparin lock flush 100 unit/mL  . [COMPLETED] leucovorin 804 mg in dextrose 5 % 250 mL infusion  . [COMPLETED] oxaliplatin (ELOXATIN) 140 mg in dextrose 5 % 500 mL chemo infusion  . sodium chloride flush (NS) 0.9 % injection 10 mL    No Known Allergies  Review of Systems  Constitutional: Positive for fatigue and unexpected weight change. Negative for activity change, appetite change, chills and fever.  HENT: Positive for congestion and rhinorrhea.   Eyes: Negative for photophobia and visual disturbance.  Respiratory: Positive for cough.  Mild cough and PND for 2 days.  No fever.  Cardiovascular: Positive for leg swelling. Negative for chest pain and palpitations.  Gastrointestinal: Positive for abdominal distention, abdominal pain and nausea. Negative for blood in stool, constipation and diarrhea.  Genitourinary: Negative for difficulty urinating and frequency.  Musculoskeletal: Positive for back pain.  Neurological: Negative for dizziness and headaches.  Hematological: Bruises/bleeds easily.  Psychiatric/Behavioral: Negative for dysphoric mood and sleep disturbance.  The patient is not nervous/anxious.     BP 124/80 (BP Location: Left Arm, Patient Position: Sitting, Cuff Size: Normal)   Pulse (!) 111   Ht '6\' 2"'$  (1.88 m)   Wt 156 lb 1.9 oz (70.8 kg)   SpO2 99%   BMI 20.04 kg/m   Physical Exam  Constitutional: He is oriented to person, place, and time. He appears well-developed.  Cachectic, thin  HENT:  Head: Normocephalic and atraumatic.  Right Ear: External ear normal.  Left Ear: External ear normal.  Mouth/Throat: Oropharynx is clear and moist.  Poor dental repair. Clear rhinorrhea  Eyes: Conjunctivae are normal. Pupils are equal, round, and reactive to light.  Neck: Normal range of motion. No thyromegaly present.  Cardiovascular: Regular rhythm and normal heart sounds.   No murmur heard. Tachycardia  Pulmonary/Chest: Effort normal and breath sounds normal. No respiratory distress. He has no wheezes.  rhonchi left base  Abdominal: Soft. Bowel sounds are normal. He exhibits distension. There is tenderness.  Distended. Ventral hernia. Diffusely tender. Hepatomegaly.  Musculoskeletal: Normal range of motion. He exhibits edema.  1+ pitting edema  Lymphadenopathy:    He has no cervical adenopathy.  Neurological: He is alert and oriented to person, place, and time. He has normal reflexes.  Psychiatric: He has a normal mood and affect. His behavior is normal. Judgment and thought content normal.    ASSESSMENT/PLAN:  1. Malignant neoplasm of lower third of esophagus (HCC)   2. Severe protein-calorie malnutrition ([place]) Still needs nutrition consult.  Alb is up to 2.8   3. Diabetes mellitus, new onset (HCC) Improved sugars.  Will check an Aic in december     Patient Instructions  Your blood sugar is much better Continue taking the metformin once a day  Need to re schedule the nutrition appointment  We will keep trying to find inexpensive or free medicine  See me in December  Call sooner for any problems   Raylene Everts,  MD

## 2016-10-04 NOTE — Patient Instructions (Signed)
Your blood sugar is much better Continue taking the metformin once a day  Need to re schedule the nutrition appointment  We will keep trying to find inexpensive or free medicine  See me in December  Call sooner for any problems

## 2016-10-05 ENCOUNTER — Ambulatory Visit: Payer: Self-pay | Admitting: Nutrition

## 2016-10-05 LAB — CEA: CEA: 7 ng/mL — ABNORMAL HIGH (ref 0.0–4.7)

## 2016-10-06 ENCOUNTER — Telehealth (HOSPITAL_COMMUNITY): Payer: Self-pay | Admitting: Emergency Medicine

## 2016-10-06 ENCOUNTER — Encounter (HOSPITAL_BASED_OUTPATIENT_CLINIC_OR_DEPARTMENT_OTHER): Payer: Medicaid Other

## 2016-10-06 VITALS — BP 105/82 | HR 108 | Temp 98.0°F | Resp 18

## 2016-10-06 DIAGNOSIS — Z452 Encounter for adjustment and management of vascular access device: Secondary | ICD-10-CM | POA: Diagnosis not present

## 2016-10-06 DIAGNOSIS — C787 Secondary malignant neoplasm of liver and intrahepatic bile duct: Secondary | ICD-10-CM

## 2016-10-06 DIAGNOSIS — C155 Malignant neoplasm of lower third of esophagus: Secondary | ICD-10-CM

## 2016-10-06 MED ORDER — HEPARIN SOD (PORK) LOCK FLUSH 100 UNIT/ML IV SOLN
500.0000 [IU] | Freq: Once | INTRAVENOUS | Status: AC | PRN
Start: 2016-10-06 — End: 2016-10-06
  Administered 2016-10-06: 500 [IU]

## 2016-10-06 MED ORDER — SODIUM CHLORIDE 0.9% FLUSH
10.0000 mL | INTRAVENOUS | Status: DC | PRN
  Administered 2016-10-06: 10 mL
  Filled 2016-10-06: qty 10

## 2016-10-06 MED ORDER — HEPARIN SOD (PORK) LOCK FLUSH 100 UNIT/ML IV SOLN
INTRAVENOUS | Status: AC
  Filled 2016-10-06: qty 5

## 2016-10-06 NOTE — Telephone Encounter (Signed)
Pt called and wanted to know when he could come to have his chemotherapy pump removed.  Checked with the chemotherapy nurses and he can come with the pump finishes.  Pt verbalized understanding.

## 2016-10-06 NOTE — Patient Instructions (Signed)
[place] at [facility] Discharge Instructions  RECOMMENDATIONS MADE BY THE CONSULTANT AND ANY TEST RESULTS WILL BE SENT TO YOUR REFERRING PHYSICIAN.  5FU pump discontinued today and port flushed per protocol. Follow-up as scheduled. Call clinic for any questions or concerns  Thank you for choosing [place] at [facility] to provide your oncology and hematology care.  To afford each patient quality time with our provider, please arrive at least 15 minutes before your scheduled appointment time.   Beginning January 23rd 2017 lab work for the [facility] will be done in the  Main lab at [facility] on 1st floor. If you have a lab appointment with the [facility] please come in thru the  Main Entrance and check in at the main information desk  You need to re-schedule your appointment should you arrive 10 or more minutes late.  We strive to give you quality time with our providers, and arriving late affects you and other patients whose appointments are after yours.  Also, if you no show three or more times for appointments you may be dismissed from the clinic at the providers discretion.     Again, thank you for choosing [facility].  Our hope is that these requests will decrease the amount of time that you wait before being seen by our physicians.       _____________________________________________________________  Should you have questions after your visit to [facility], please contact our office at [phone] between the hours of 8:30 a.m. and 4:30 p.m.  Voicemails left after 4:30 p.m. will not be returned until the following business day.  For prescription refill requests, have your pharmacy contact our office.         Resources For Cancer Patients and their Caregivers ? American Cancer Society: Can assist with transportation, wigs, general needs, runs Look Good Feel Better.        [phone] ? Cancer  Care: Provides financial assistance, online support groups, medication/co-pay assistance.  1-800-813-HOPE 8021986077) ? [place] Assists [place] cancer patients and their families through emotional , educational and financial support.  365-106-5090 ? [place] DSS Where to apply for food stamps, Medicaid and utility assistance. [phone] ? RCATS: Transportation to medical appointments. 534-549-7605 ? Social Security Administration: May apply for disability if have a Stage IV cancer. [phone] ? [employer], Disability and Transit Services: Assists with nutrition, care and transit needs. [place] Support Programs: '@10RELATIVEDAYS'$ @ > Cancer Support Group  2nd Tuesday of the month 1pm-2pm, Journey Room  > Creative Journey  3rd Tuesday of the month 1130am-1pm, Journey Room  > Look Good Feel Better  1st Wednesday of the month 10am-12 noon, Journey Room (Call [place] to register [phone])

## 2016-10-06 NOTE — Progress Notes (Signed)
Joshua Greene tolerated 5FU pump well without complaints or incident. 5FU pump discontinued and port flushed with 10 ml NS and 5 ml Heparin easily. VSS Pt discharged self ambulatory in satisfactory condition with fiance

## 2016-10-10 ENCOUNTER — Telehealth (HOSPITAL_COMMUNITY): Payer: Self-pay | Admitting: Emergency Medicine

## 2016-10-10 NOTE — Telephone Encounter (Signed)
Called pt to let him know that I faxed his [place] paper work, It would be at the front desk in the red folder for him to pick up.  Pt verbalized understanding.

## 2016-10-11 ENCOUNTER — Ambulatory Visit: Payer: Self-pay | Admitting: Nutrition

## 2016-10-16 ENCOUNTER — Ambulatory Visit: Payer: Self-pay | Admitting: Internal Medicine

## 2016-10-16 ENCOUNTER — Other Ambulatory Visit: Payer: Self-pay

## 2016-10-18 ENCOUNTER — Encounter (HOSPITAL_BASED_OUTPATIENT_CLINIC_OR_DEPARTMENT_OTHER): Payer: Medicaid Other

## 2016-10-18 VITALS — BP 129/93 | HR 98 | Temp 98.0°F | Resp 18 | Wt 156.4 lb

## 2016-10-18 DIAGNOSIS — Z5111 Encounter for antineoplastic chemotherapy: Secondary | ICD-10-CM | POA: Diagnosis present

## 2016-10-18 DIAGNOSIS — C787 Secondary malignant neoplasm of liver and intrahepatic bile duct: Secondary | ICD-10-CM | POA: Diagnosis not present

## 2016-10-18 DIAGNOSIS — C155 Malignant neoplasm of lower third of esophagus: Secondary | ICD-10-CM

## 2016-10-18 DIAGNOSIS — C159 Malignant neoplasm of esophagus, unspecified: Secondary | ICD-10-CM | POA: Diagnosis not present

## 2016-10-18 LAB — COMPREHENSIVE METABOLIC PANEL
ALK PHOS: 153 U/L — AB (ref 38–126)
ALT: 27 U/L (ref 17–63)
ANION GAP: 6 (ref 5–15)
AST: 65 U/L — ABNORMAL HIGH (ref 15–41)
Albumin: 2.9 g/dL — ABNORMAL LOW (ref 3.5–5.0)
BUN: 5 mg/dL — ABNORMAL LOW (ref 6–20)
CALCIUM: 8.7 mg/dL — AB (ref 8.9–10.3)
CO2: 26 mmol/L (ref 22–32)
CREATININE: 0.47 mg/dL — AB (ref 0.61–1.24)
Chloride: 100 mmol/L — ABNORMAL LOW (ref 101–111)
Glucose, Bld: 176 mg/dL — ABNORMAL HIGH (ref 65–99)
Potassium: 3.5 mmol/L (ref 3.5–5.1)
SODIUM: 132 mmol/L — AB (ref 135–145)
Total Bilirubin: 2.3 mg/dL — ABNORMAL HIGH (ref 0.3–1.2)
Total Protein: 7.2 g/dL (ref 6.5–8.1)

## 2016-10-18 LAB — CBC WITH DIFFERENTIAL/PLATELET
Basophils Absolute: 0.1 10*3/uL (ref 0.0–0.1)
Basophils Relative: 1 %
EOS ABS: 0.1 10*3/uL (ref 0.0–0.7)
EOS PCT: 1 %
HCT: 38 % — ABNORMAL LOW (ref 39.0–52.0)
HEMOGLOBIN: 12.7 g/dL — AB (ref 13.0–17.0)
LYMPHS ABS: 1.9 10*3/uL (ref 0.7–4.0)
LYMPHS PCT: 26 %
MCH: 33.9 pg (ref 26.0–34.0)
MCHC: 33.4 g/dL (ref 30.0–36.0)
MCV: 101.3 fL — AB (ref 78.0–100.0)
MONOS PCT: 20 %
Monocytes Absolute: 1.5 10*3/uL — ABNORMAL HIGH (ref 0.1–1.0)
Neutro Abs: 3.9 10*3/uL (ref 1.7–7.7)
Neutrophils Relative %: 52 %
PLATELETS: 97 10*3/uL — AB (ref 150–400)
RBC: 3.75 MIL/uL — ABNORMAL LOW (ref 4.22–5.81)
RDW: 16 % — ABNORMAL HIGH (ref 11.5–15.5)
WBC: 7.5 10*3/uL (ref 4.0–10.5)

## 2016-10-18 MED ORDER — SODIUM CHLORIDE 0.9 % IV SOLN
1800.0000 mg/m2 | INTRAVENOUS | Status: DC
  Administered 2016-10-18: 3600 mg via INTRAVENOUS
  Filled 2016-10-18: qty 72

## 2016-10-18 MED ORDER — PALONOSETRON HCL INJECTION 0.25 MG/5ML
0.2500 mg | Freq: Once | INTRAVENOUS | Status: AC
  Administered 2016-10-18: 0.25 mg via INTRAVENOUS
  Filled 2016-10-18: qty 5

## 2016-10-18 MED ORDER — HEPARIN SOD (PORK) LOCK FLUSH 100 UNIT/ML IV SOLN: 500.0000 [IU] | Freq: Once | INTRAVENOUS | Status: DC | PRN

## 2016-10-18 MED ORDER — OXALIPLATIN CHEMO INJECTION 100 MG/20ML
70.0000 mg/m2 | Freq: Once | INTRAVENOUS | Status: AC
  Administered 2016-10-18: 140 mg via INTRAVENOUS
  Filled 2016-10-18: qty 28

## 2016-10-18 MED ORDER — DEXAMETHASONE SODIUM PHOSPHATE 10 MG/ML IJ SOLN
12.0000 mg | Freq: Once | INTRAMUSCULAR | Status: AC
  Administered 2016-10-18: 12 mg via INTRAVENOUS
  Filled 2016-10-18: qty 2

## 2016-10-18 MED ORDER — SODIUM CHLORIDE 0.9% FLUSH: 10.0000 mL | INTRAVENOUS | Status: DC | PRN

## 2016-10-18 MED ORDER — LEUCOVORIN CALCIUM INJECTION 350 MG
400.0000 mg/m2 | Freq: Once | INTRAMUSCULAR | Status: AC
  Administered 2016-10-18: 804 mg via INTRAVENOUS
  Filled 2016-10-18: qty 35

## 2016-10-18 MED ORDER — SODIUM CHLORIDE 0.9 % IV SOLN: 12.0000 mg | Freq: Once | INTRAVENOUS | Status: DC

## 2016-10-18 MED ORDER — DEXTROSE 5 % IV SOLN
Freq: Once | INTRAVENOUS | Status: AC
  Administered 2016-10-18: 11:00:00 via INTRAVENOUS

## 2016-10-18 NOTE — Progress Notes (Signed)
Patient tolerated infusion well.  VSS.  Patient ambulatory and stable upon discharge from clinic.   

## 2016-10-18 NOTE — Patient Instructions (Signed)
[facility] Discharge Instructions for Patients Receiving Chemotherapy   Beginning January 23rd 2017 lab work for the [facility] will be done in the  Main lab at [facility] on 1st floor. If you have a lab appointment with the [facility] please come in thru the  Main Entrance and check in at the main information desk   Today you received the following chemotherapy agents: oxaliplatin, leucovorin, and fluorouracil.     If you develop nausea and vomiting, or diarrhea that is not controlled by your medication, call the clinic.  The clinic phone number is [phone]. Office hours are Monday-Friday 8:30am-5:00pm.  BELOW ARE SYMPTOMS THAT SHOULD BE REPORTED IMMEDIATELY:  *FEVER GREATER THAN 101.0 F  *CHILLS WITH OR WITHOUT FEVER  NAUSEA AND VOMITING THAT IS NOT CONTROLLED WITH YOUR NAUSEA MEDICATION  *UNUSUAL SHORTNESS OF BREATH  *UNUSUAL BRUISING OR BLEEDING  TENDERNESS IN MOUTH AND THROAT WITH OR WITHOUT PRESENCE OF ULCERS  *URINARY PROBLEMS  *BOWEL PROBLEMS  UNUSUAL RASH Items with * indicate a potential emergency and should be followed up as soon as possible. If you have an emergency after office hours please contact your primary care physician or go to the nearest emergency department.  Please call the clinic during office hours if you have any questions or concerns.   You may also contact the Patient Navigator at [phone] should you have any questions or need assistance in obtaining follow up care.      Resources For Cancer Patients and their Caregivers ? American Cancer Society: Can assist with transportation, wigs, general needs, runs Look Good Feel Better.        660-309-6730 ? Cancer Care: Provides financial assistance, online support groups, medication/co-pay assistance.  1-800-813-HOPE (317)492-2501) ? [place] [facility] Assists [place] cancer patients and their families through emotional , educational and  financial support.  251-748-2499 ? [place] DSS Where to apply for food stamps, Medicaid and utility assistance. [phone] ? RCATS: Transportation to medical appointments. 617-125-6174 ? Social Security Administration: May apply for disability if have a Stage IV cancer. [phone] ? [employer], Disability and Transit Services: Assists with nutrition, care and transit needs. [phone]

## 2016-10-19 ENCOUNTER — Ambulatory Visit: Payer: Self-pay | Admitting: Nutrition

## 2016-10-20 ENCOUNTER — Encounter (HOSPITAL_BASED_OUTPATIENT_CLINIC_OR_DEPARTMENT_OTHER): Payer: Medicaid Other

## 2016-10-20 VITALS — BP 104/66 | HR 111 | Temp 98.3°F | Resp 18

## 2016-10-20 DIAGNOSIS — C787 Secondary malignant neoplasm of liver and intrahepatic bile duct: Secondary | ICD-10-CM | POA: Diagnosis not present

## 2016-10-20 DIAGNOSIS — C155 Malignant neoplasm of lower third of esophagus: Secondary | ICD-10-CM

## 2016-10-20 MED ORDER — HEPARIN SOD (PORK) LOCK FLUSH 100 UNIT/ML IV SOLN
500.0000 [IU] | Freq: Once | INTRAVENOUS | Status: AC | PRN
  Administered 2016-10-20: 500 [IU]

## 2016-10-20 MED ORDER — SODIUM CHLORIDE 0.9% FLUSH
10.0000 mL | INTRAVENOUS | Status: DC | PRN
Start: 2016-10-20 — End: 2016-10-20
  Administered 2016-10-20: 10 mL
  Filled 2016-10-20: qty 10

## 2016-10-20 NOTE — Patient Instructions (Signed)
[place] at [facility] Discharge Instructions  RECOMMENDATIONS MADE BY THE CONSULTANT AND ANY TEST RESULTS WILL BE SENT TO YOUR REFERRING PHYSICIAN.  Return as scheduled.  Thank you for choosing [place] at [facility] to provide your oncology and hematology care.  To afford each patient quality time with our provider, please arrive at least 15 minutes before your scheduled appointment time.   Beginning January 23rd 2017 lab work for the [facility] will be done in the  Main lab at [facility] on 1st floor. If you have a lab appointment with the [facility] please come in thru the  Main Entrance and check in at the main information desk  You need to re-schedule your appointment should you arrive 10 or more minutes late.  We strive to give you quality time with our providers, and arriving late affects you and other patients whose appointments are after yours.  Also, if you no show three or more times for appointments you may be dismissed from the clinic at the providers discretion.     Again, thank you for choosing [facility].  Our hope is that these requests will decrease the amount of time that you wait before being seen by our physicians.       _____________________________________________________________  Should you have questions after your visit to [facility], please contact our office at [phone] between the hours of 8:30 a.m. and 4:30 p.m.  Voicemails left after 4:30 p.m. will not be returned until the following business day.  For prescription refill requests, have your pharmacy contact our office.         Resources For Cancer Patients and their Caregivers ? American Cancer Society: Can assist with transportation, wigs, general needs, runs Look Good Feel Better.        929-600-3264 ? Cancer Care: Provides financial assistance, online support groups, medication/co-pay assistance.   1-800-813-HOPE (463) 014-4153) ? [place] Assists [place] cancer patients and their families through emotional , educational and financial support.  757-269-9212 ? [place] DSS Where to apply for food stamps, Medicaid and utility assistance. [phone] 728-8463 ? RCATS: Transportation to medical appointments. [phone] ? Social Security Administration: May apply for disability if have a Stage IV cancer. 720-031-6311 [phone] ? [employer], Disability and Transit Services: Assists with nutrition, care and transit needs. [place] Support Programs: '@10RELATIVEDAYS'$ @ > Cancer Support Group  2nd Tuesday of the month 1pm-2pm, Journey Room  > Creative Journey  3rd Tuesday of the month 1130am-1pm, Journey Room  > Look Good Feel Better  1st Wednesday of the month 10am-12 noon, Journey Room (Call [place] to register [phone])

## 2016-10-20 NOTE — Progress Notes (Signed)
Continuous infusion pump stopped/completed on patient's arrival. Disconnected pump from patient, flushed port per protocol. Patient denies complaints post chemo. Stable and ambulatory on discharge home to self.

## 2016-10-21 IMAGING — CT CT CHEST W/ CM
2 of 5 series · 12 of 36 positions shown, 15 images · IV contrast (APPLIED)
Comparison: PET-CT 05/26/2016. Chest CT 05/15/2016; abdomen pelvis
CT 05/09/2016.

CLINICAL DATA: Patient with history of esophageal carcinoma
diagnosed [DATE]. Restaging evaluation on chemotherapy.

EXAM:
CT CHEST, ABDOMEN AND PELVIS WITHOUT CONTRAST
TECHNIQUE: Multidetector CT imaging of the chest, abdomen and pelvis was
performed following the standard protocol without IV contrast.

[Series 2: cap with · axial · 0.73mm/px · z∈[+678,+1198]mm · 9 of 131 slices shown, 12 images]
[im 14/131  mediastinal]
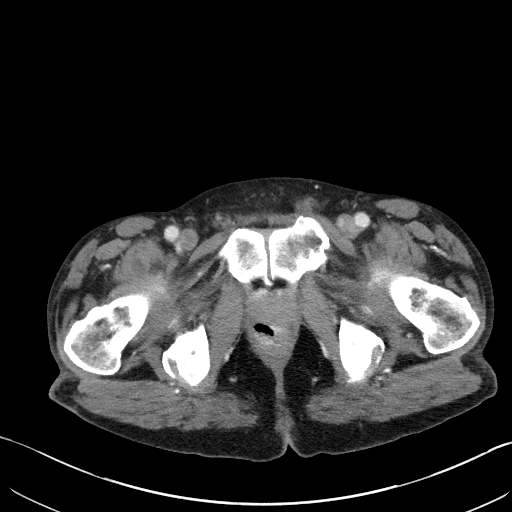
[im 14/131  lung]
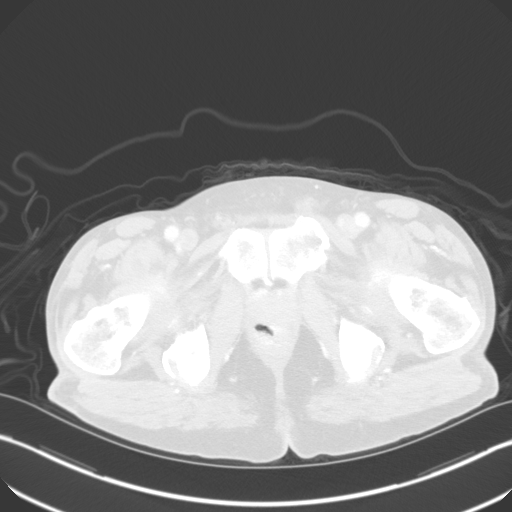
[im 27/131  lung]
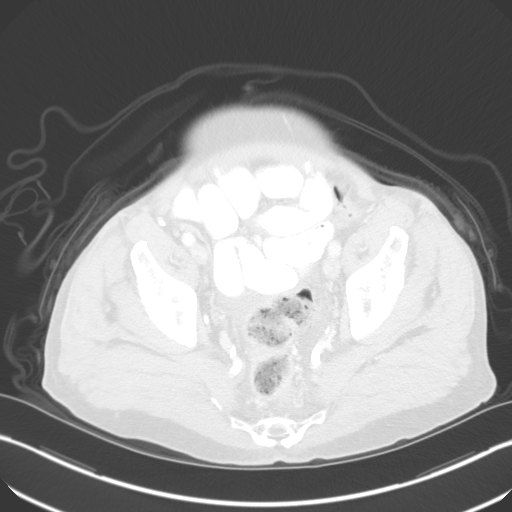
[im 40/131  lung]
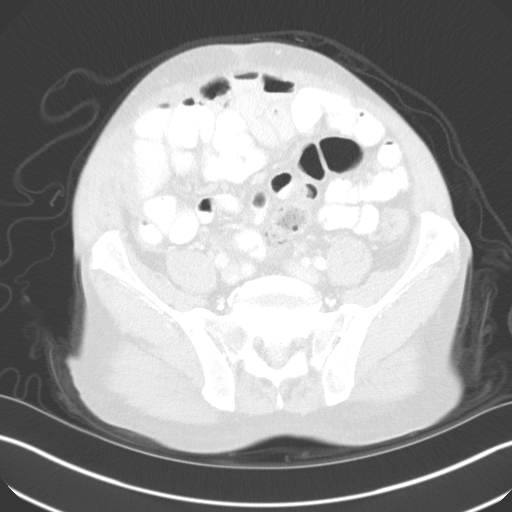
[im 53/131  lung]
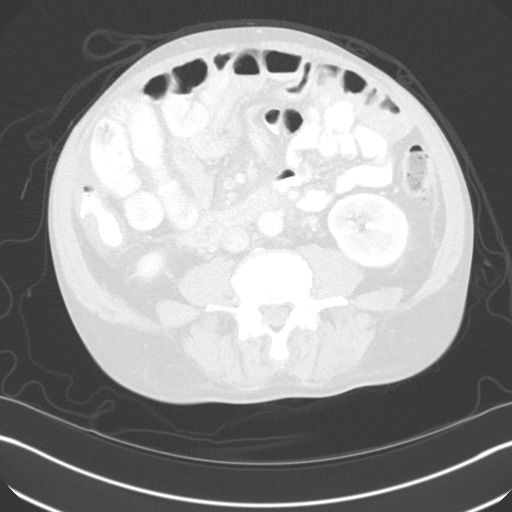
[im 66/131  mediastinal]
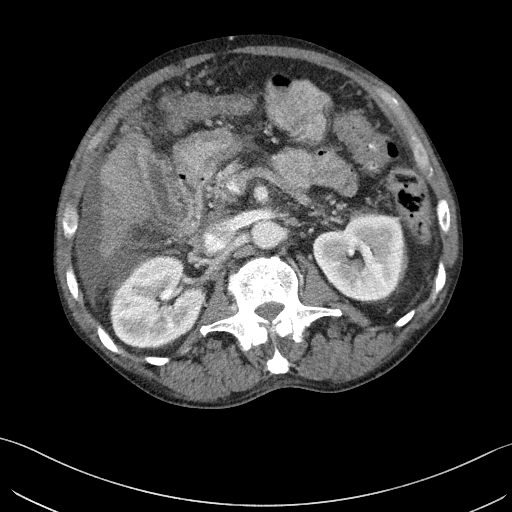
[im 66/131  lung]
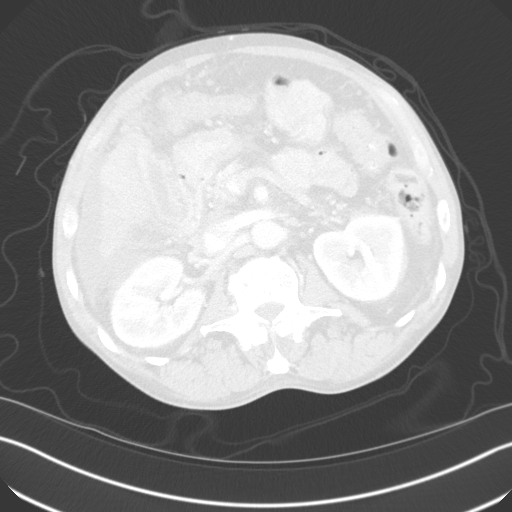
[im 79/131  lung]
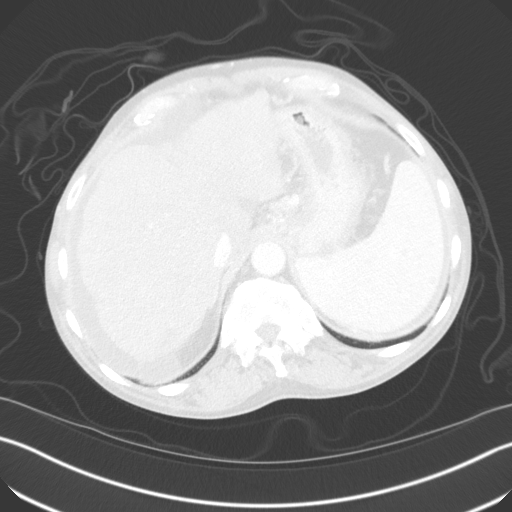
[im 92/131  lung]
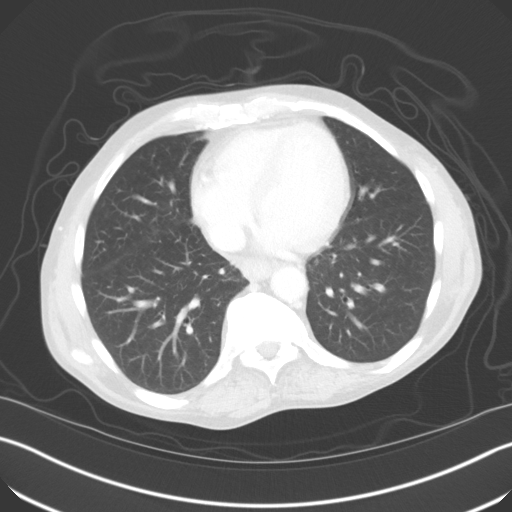
[im 105/131  lung]
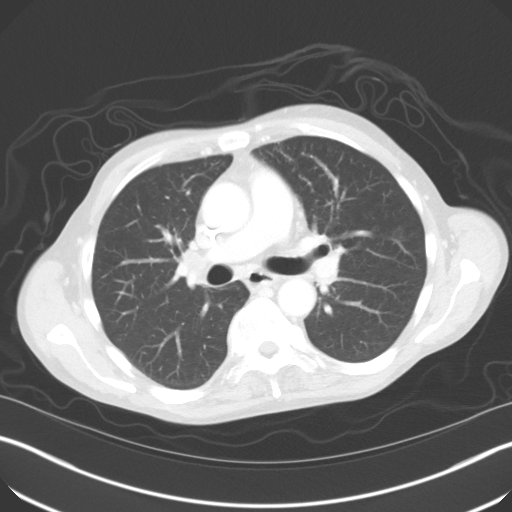
[im 118/131  mediastinal]
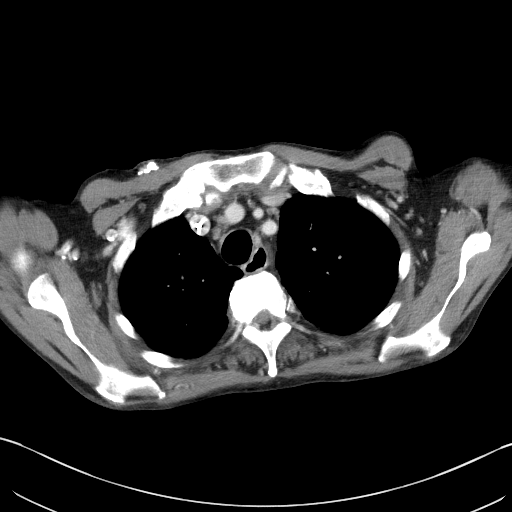
[im 118/131  lung]
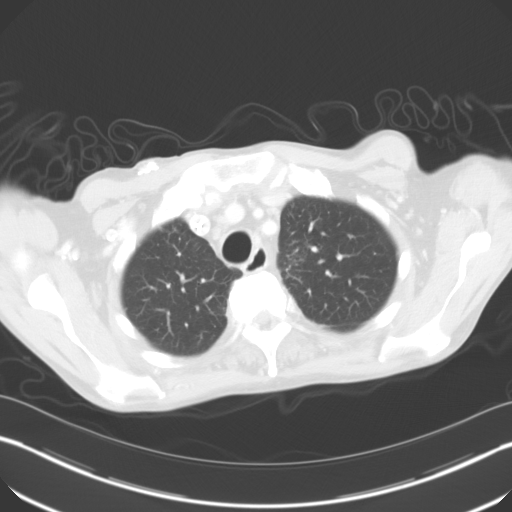

[Series 5: coronals · coronal · 0.81mm/px · 3 of 153 slices shown]
[im 31/153  lung]
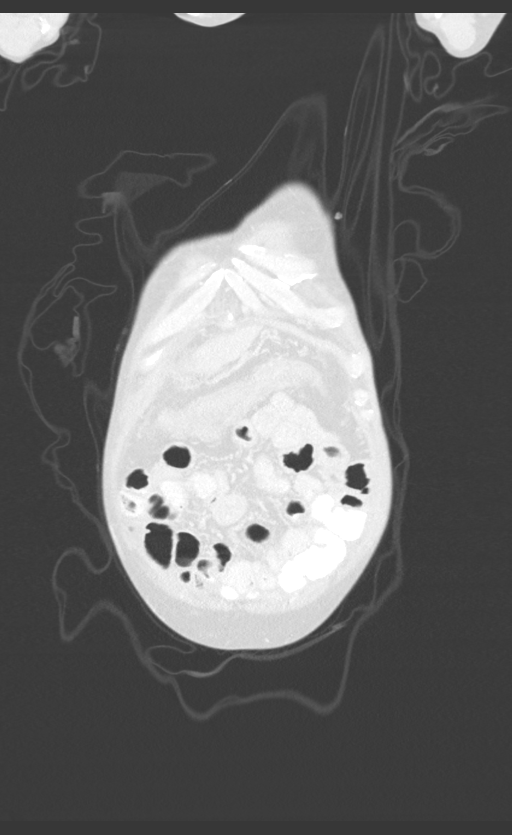
[im 61/153  lung]
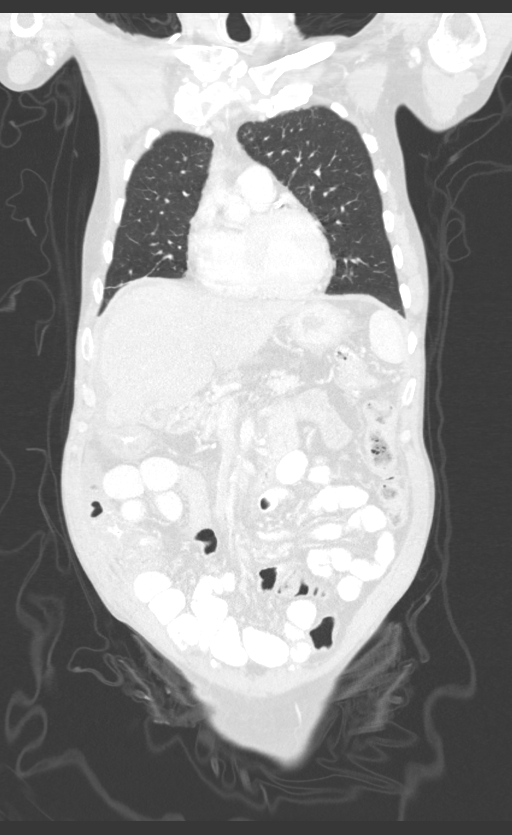
[im 92/153  lung]
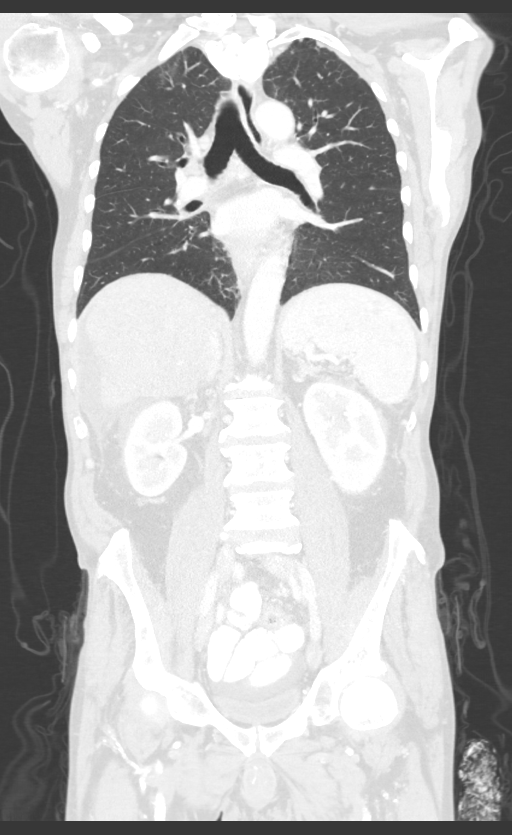

[12 of 36 positions shown; findings below may reference images not displayed]

FINDINGS: CT CHEST FINDINGS

Cardiovascular: Right anterior chest wall Port-A-Cath is present
with tip terminating in the superior vena cava. Normal heart size.
Coronary arterial vascular calcifications. Aorta and main pulmonary
artery are normal in caliber.

Mediastinum/Nodes: Interval decrease in size of right paraesophageal
lymph node measuring 0.7 cm (image 40; series 2), previously 1.7 cm.
Additionally, significant interval decrease in size of previously
described right hilar soft tissue, nearly resolved (image 33; series
2). Persistent mild wall thickening of the distal esophagus.

Lungs/Pleura: Central airways are patent. Interval decrease in size
of right upper lobe nodule measuring 0.4 x 0.3 cm (image 50; series
4), previously 0.6 x 0.5 cm. Near complete resolution of previously
described adjacent 3 mm right upper lobe nodule (image 53; series
4). Interval resolution of previously described subpleural
consolidation within the right lower lobe. No new or enlarging
pulmonary nodules or masses identified. No pleural effusion or
pneumothorax. Biapical pleural parenchymal thickening and scarring.

Musculoskeletal: Sclerosis involving the posterior spinous process
of T1 (image 106; series 6). 11 mm sclerotic lesion involving the
left aspect of the sternum (image 31; series 2).

CT ABDOMEN PELVIS FINDINGS

Hepatobiliary: The liver is markedly heterogeneous in attenuation.
The liver is shrunken in size and lobular in contour. Given the
heterogeneity of the hepatic parenchyma, many of the liver lesions
are difficult to identify. Reference lesion within the central upper
measures 1.5 x 1.5 cm (image 76; series 5), previously 4.3 x 3.5 cm.
There are multiple small low-attenuation lesions demonstrated
throughout the liver. Gallbladder is decompressed.

Pancreas: Unremarkable

Spleen: Re- demonstrated innumerable low-attenuation lesions
throughout the spleen, measuring up to 1.2 cm (image 51; series 2),
grossly unchanged.

Adrenals/Urinary Tract: The adrenal glands are normal. Kidneys
enhance symmetrically with contrast. No hydronephrosis. Stable sub
cm partially exophytic low-attenuation lesion off the inferior pole
of the right kidney. Urinary bladder is unremarkable.

Stomach/Bowel: There is circumferential wall thickening of the
cecum, ascending and transverse colon. Oral contrast material
throughout the small bowel. No evidence for bowel obstruction. No
free intraperitoneal air. Normal morphology of the stomach.

Vascular/Lymphatic: Peripheral calcified atherosclerotic plaque. No
retroperitoneal lymphadenopathy.

Reproductive: Unremarkable

Other: Small volume of ascites, predominantly within the perihepatic
location and right paracolic gutter.

Musculoskeletal: Lumbar spine degenerative changes. No aggressive or
acute appearing osseous lesions.
IMPRESSION: Interval decrease in size of previously described right upper lobe
pulmonary nodules.

The liver is overall shrunken in appearance when compared to prior
examination and lobular in contour, suggestive of treatment response
of multiple hepatic lesions. Given the overall heterogeneity of the
hepatic parenchyma, individual lesions are difficult to identify.
Consider further evaluation with MRI as clinically indicated.

Re- demonstrated sclerotic metastasis involving the spinous process
of T1 and the left aspect of the sternum.

Re- demonstrated multiple low-attenuation splenic lesions. Recommend
attention on followup.

Interval development of circumferential wall thickening of the
cecum, ascending and transverse colon which may be secondary to an
infectious or inflammatory process.

New small volume perihepatic and right paracolic gutter ascites.

These results will be called to the ordering clinician or
representative by the Radiologist Assistant, and communication
documented in the PACS or zVision Dashboard.

## 2016-10-28 ENCOUNTER — Encounter (HOSPITAL_COMMUNITY): Payer: Self-pay | Admitting: Hematology & Oncology

## 2016-10-31 DIAGNOSIS — C787 Secondary malignant neoplasm of liver and intrahepatic bile duct: Secondary | ICD-10-CM | POA: Diagnosis not present

## 2016-10-31 DIAGNOSIS — C159 Malignant neoplasm of esophagus, unspecified: Secondary | ICD-10-CM | POA: Diagnosis not present

## 2016-11-01 ENCOUNTER — Ambulatory Visit (HOSPITAL_COMMUNITY): Payer: Self-pay | Admitting: Hematology & Oncology

## 2016-11-01 ENCOUNTER — Encounter (HOSPITAL_COMMUNITY): Payer: Medicaid Other | Attending: Hematology & Oncology

## 2016-11-01 ENCOUNTER — Encounter (HOSPITAL_COMMUNITY): Payer: Self-pay | Admitting: Oncology

## 2016-11-01 ENCOUNTER — Encounter (HOSPITAL_COMMUNITY): Payer: Medicaid Other | Attending: Oncology | Admitting: Oncology

## 2016-11-01 VITALS — BP 147/87 | HR 86 | Temp 97.8°F | Resp 18

## 2016-11-01 DIAGNOSIS — R110 Nausea: Secondary | ICD-10-CM

## 2016-11-01 DIAGNOSIS — C787 Secondary malignant neoplasm of liver and intrahepatic bile duct: Secondary | ICD-10-CM

## 2016-11-01 DIAGNOSIS — C155 Malignant neoplasm of lower third of esophagus: Secondary | ICD-10-CM | POA: Diagnosis present

## 2016-11-01 DIAGNOSIS — R197 Diarrhea, unspecified: Secondary | ICD-10-CM

## 2016-11-01 DIAGNOSIS — F1010 Alcohol abuse, uncomplicated: Secondary | ICD-10-CM | POA: Diagnosis not present

## 2016-11-01 DIAGNOSIS — Z5111 Encounter for antineoplastic chemotherapy: Secondary | ICD-10-CM

## 2016-11-01 DIAGNOSIS — R109 Unspecified abdominal pain: Secondary | ICD-10-CM

## 2016-11-01 DIAGNOSIS — C159 Malignant neoplasm of esophagus, unspecified: Secondary | ICD-10-CM | POA: Diagnosis not present

## 2016-11-01 LAB — COMPREHENSIVE METABOLIC PANEL
ALK PHOS: 227 U/L — AB (ref 38–126)
ALT: 45 U/L (ref 17–63)
AST: 117 U/L — ABNORMAL HIGH (ref 15–41)
Albumin: 3.2 g/dL — ABNORMAL LOW (ref 3.5–5.0)
Anion gap: 8 (ref 5–15)
BILIRUBIN TOTAL: 2.8 mg/dL — AB (ref 0.3–1.2)
BUN: <5 mg/dL — ABNORMAL LOW (ref 6–20)
CALCIUM: 8.8 mg/dL — AB (ref 8.9–10.3)
CO2: 27 mmol/L (ref 22–32)
CREATININE: 0.43 mg/dL — AB (ref 0.61–1.24)
Chloride: 100 mmol/L — ABNORMAL LOW (ref 101–111)
Glucose, Bld: 120 mg/dL — ABNORMAL HIGH (ref 65–99)
Potassium: 3.6 mmol/L (ref 3.5–5.1)
SODIUM: 135 mmol/L (ref 135–145)
Total Protein: 7.6 g/dL (ref 6.5–8.1)

## 2016-11-01 LAB — CBC WITH DIFFERENTIAL/PLATELET
Basophils Absolute: 0.1 10*3/uL (ref 0.0–0.1)
Basophils Relative: 1 %
Eosinophils Absolute: 0.1 10*3/uL (ref 0.0–0.7)
Eosinophils Relative: 1 %
HEMATOCRIT: 37.9 % — AB (ref 39.0–52.0)
HEMOGLOBIN: 12.5 g/dL — AB (ref 13.0–17.0)
LYMPHS ABS: 1.9 10*3/uL (ref 0.7–4.0)
LYMPHS PCT: 26 %
MCH: 33.2 pg (ref 26.0–34.0)
MCHC: 33 g/dL (ref 30.0–36.0)
MCV: 100.8 fL — AB (ref 78.0–100.0)
Monocytes Absolute: 1.3 10*3/uL — ABNORMAL HIGH (ref 0.1–1.0)
Monocytes Relative: 19 %
NEUTROS ABS: 3.7 10*3/uL (ref 1.7–7.7)
NEUTROS PCT: 53 %
Platelets: 100 10*3/uL — ABNORMAL LOW (ref 150–400)
RBC: 3.76 MIL/uL — AB (ref 4.22–5.81)
RDW: 15.9 % — ABNORMAL HIGH (ref 11.5–15.5)
WBC: 7 10*3/uL (ref 4.0–10.5)

## 2016-11-01 MED ORDER — OXALIPLATIN CHEMO INJECTION 100 MG/20ML
70.0000 mg/m2 | Freq: Once | INTRAVENOUS | Status: AC
  Administered 2016-11-01: 140 mg via INTRAVENOUS
  Filled 2016-11-01: qty 20

## 2016-11-01 MED ORDER — PALONOSETRON HCL INJECTION 0.25 MG/5ML
0.2500 mg | Freq: Once | INTRAVENOUS | Status: AC
  Administered 2016-11-01: 0.25 mg via INTRAVENOUS
  Filled 2016-11-01: qty 5

## 2016-11-01 MED ORDER — SODIUM CHLORIDE 0.9 % IV SOLN: 12.0000 mg | Freq: Once | INTRAVENOUS | Status: DC

## 2016-11-01 MED ORDER — DEXTROSE 5 % IV SOLN
Freq: Once | INTRAVENOUS | Status: AC
  Administered 2016-11-01: 11:00:00 via INTRAVENOUS

## 2016-11-01 MED ORDER — FLUOROURACIL CHEMO INJECTION 5 GM/100ML
1800.0000 mg/m2 | INTRAVENOUS | Status: DC
  Administered 2016-11-01: 3600 mg via INTRAVENOUS
  Filled 2016-11-01: qty 72

## 2016-11-01 MED ORDER — DEXAMETHASONE SODIUM PHOSPHATE 10 MG/ML IJ SOLN
12.0000 mg | Freq: Once | INTRAMUSCULAR | Status: AC
  Administered 2016-11-01: 12 mg via INTRAVENOUS
  Filled 2016-11-01: qty 2

## 2016-11-01 MED ORDER — SODIUM CHLORIDE 0.9% FLUSH
10.0000 mL | INTRAVENOUS | Status: DC | PRN
  Administered 2016-11-01: 10 mL
  Filled 2016-11-01: qty 10

## 2016-11-01 MED ORDER — LEUCOVORIN CALCIUM INJECTION 350 MG
400.0000 mg/m2 | Freq: Once | INTRAVENOUS | Status: AC
  Administered 2016-11-01: 804 mg via INTRAVENOUS
  Filled 2016-11-01: qty 40.2

## 2016-11-01 NOTE — Patient Instructions (Signed)
[facility] Discharge Instructions for Patients Receiving Chemotherapy   Beginning January 23rd 2017 lab work for the [facility] will be done in the  Main lab at [facility] on 1st floor. If you have a lab appointment with the [place] please come in thru the  Main Entrance and check in at the main information desk   Today you received the following chemotherapy agents oxaliplatin, leucovorin  To help prevent nausea and vomiting after your treatment, we encourage you to take your nausea medication     If you develop nausea and vomiting, or diarrhea that is not controlled by your medication, call the clinic.  The clinic phone number is [phone]. Office hours are Monday-Friday 8:30am-5:00pm.  BELOW ARE SYMPTOMS THAT SHOULD BE REPORTED IMMEDIATELY:  *FEVER GREATER THAN 101.0 F  *CHILLS WITH OR WITHOUT FEVER  NAUSEA AND VOMITING THAT IS NOT CONTROLLED WITH YOUR NAUSEA MEDICATION  *UNUSUAL SHORTNESS OF BREATH  *UNUSUAL BRUISING OR BLEEDING  TENDERNESS IN MOUTH AND THROAT WITH OR WITHOUT PRESENCE OF ULCERS  *URINARY PROBLEMS  *BOWEL PROBLEMS  UNUSUAL RASH Items with * indicate a potential emergency and should be followed up as soon as possible. If you have an emergency after office hours please contact your primary care physician or go to the nearest emergency department.  Please call the clinic during office hours if you have any questions or concerns.   You may also contact the Patient Navigator at [phone] should you have any questions or need assistance in obtaining follow up care.      Resources For Cancer Patients and their Caregivers ? American Cancer Society: Can assist with transportation, wigs, general needs, runs Look Good Feel Better.        8303988614 ? Cancer Care: Provides financial assistance, online support groups, medication/co-pay assistance.  1-800-813-HOPE 980-732-2019) ? [place] Assists  [place] cancer patients and their families through emotional , educational and financial support.  540 206 3519 ? [place] DSS Where to apply for food stamps, Medicaid and utility assistance. 424-326-1052 ? RCATS: Transportation to medical appointments. (480)187-7081 ? Social Security Administration: May apply for disability if have a Stage IV cancer. 859-155-2422 (570)293-8856 ? [employer], Disability and Transit Services: Assists with nutrition, care and transit needs. ([phone] [phone]

## 2016-11-01 NOTE — Patient Instructions (Signed)
[place] at [facility] Discharge Instructions  RECOMMENDATIONS MADE BY THE CONSULTANT AND ANY TEST RESULTS WILL BE SENT TO YOUR REFERRING PHYSICIAN.  You were seen today by Kirby Crigler PA-C.  Return in 2 weeks for treatment. Return in 4 weeks for treatment and follow up.   Thank you for choosing [place] at [facility] to provide your oncology and hematology care.  To afford each patient quality time with our provider, please arrive at least 15 minutes before your scheduled appointment time.   Beginning January 23rd 2017 lab work for the [facility] will be done in the  Main lab at [facility] on 1st floor. If you have a lab appointment with the [place] please come in thru the  Main Entrance and check in at the main information desk  You need to re-schedule your appointment should you arrive 10 or more minutes late.  We strive to give you quality time with our providers, and arriving late affects you and other patients whose appointments are after yours.  Also, if you no show three or more times for appointments you may be dismissed from the clinic at the providers discretion.     Again, thank you for choosing [facility].  Our hope is that these requests will decrease the amount of time that you wait before being seen by our physicians.       _____________________________________________________________  Should you have questions after your visit to [facility], please contact our office at [phone] between the hours of 8:30 a.m. and 4:30 p.m.  Voicemails left after 4:30 p.m. will not be returned until the following business day.  For prescription refill requests, have your pharmacy contact our office.         Resources For Cancer Patients and their Caregivers ? American Cancer Society: Can assist with transportation, wigs, general needs, runs Look Good Feel Better.         (857)149-2457 ? Cancer Care: Provides financial assistance, online support groups, medication/co-pay assistance.  1-800-813-HOPE 989-505-2483) ? [place] Assists [place] cancer patients and their families through emotional , educational and financial support.  807-113-6688 ? [place] DSS Where to apply for food stamps, Medicaid and utility assistance. 321-567-7798 ? RCATS: Transportation to medical appointments. 773-388-5451 ? Social Security Administration: May apply for disability if have a Stage IV cancer. (939)568-6651 5126455359 ? [employer], Disability and Transit Services: Assists with nutrition, care and transit needs. [place] Support Programs: '@10RELATIVEDAYS'$ @ > Cancer Support Group  2nd Tuesday of the month 1pm-2pm, Journey Room  > Creative Journey  3rd Tuesday of the month 1130am-1pm, Journey Room  > Look Good Feel Better  1st Wednesday of the month 10am-12 noon, Journey Room (Call [place] to register [phone])

## 2016-11-01 NOTE — Progress Notes (Signed)
No primary care provider on file. No primary provider on file.  Malignant neoplasm of lower third of esophagus (HCC)  CURRENT THERAPY: FOLFOX beginning on 05/31/2016  INTERVAL HISTORY: Joshua Greene 55 y.o. 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    Esophageal cancer ([place])   05/04/2016 Miscellaneous    ED Visit secondary to bilateral ankle swelling      05/09/2016 Imaging    Enlarged liver with innumerable hepatic hypodense lesions most c/w metastatic disease. Diffuse mesenteric edema as well as multiple enlarged mesenteric and retroperitoneal LN      05/15/2016 Imaging    Two posterior RUL nodules, indeterminate, 17 mm short axis RL paraesophageal node worrisome for nodal metastasis. Site of primary remains indeterminate      05/16/2016 Initial Biopsy    Ultrasound guided biopsy of R sided liver lesion      05/16/2016 Pathology Results    Metastatic poorly differentiated carcinoma, immunoprofile not entirely specific. Favor poorly differentiated squamous cell carcinoma,       05/25/2016 Procedure    EGD with Dr. Gala Romney with esophageal varices, area of punch out ulceration that appears to be neoplastic      05/25/2016 Pathology Results    The neoplasm stained positive for CK5/6, p63 (squamous cell markers), MOC-31, CK7 (patchy). CDX-2 (weak), TTF-1 (non specific stain), and negative stains for CK20 and Nap A. The immunostain pattern and morphologyfavored poorly diff sq cell carcinoma esoph      05/29/2016 Procedure    Port-A-Cath placed by interventional radiology      05/31/2016 -  Chemotherapy    FOLFOX      07/12/2016 Miscellaneous    Dx with Enteropathogenic E coli.  Treated with Cipro x 10 days      09/18/2016 Imaging    CT CAP- Interval decrease in size of previously described right upper lobe pulmonary nodules. The liver is overall shrunken in appearance when  compared to prior examination and lobular in contour, suggestive of treatment response of multiple hepatic lesions. Given the overall heterogeneity of the hepatic parenchyma, individual lesions are difficult to identify. Consider further evaluation with MRI as clinically indicated. Re- demonstrated sclerotic metastasis involving the spinous process of T1 and the left aspect of the sternum. Re- demonstrated multiple low-attenuation splenic lesions. Recommend attention on followup. Interval development of circumferential wall thickening of the cecum, ascending and transverse colon which may be secondary to an infectious or inflammatory process. New small volume perihepatic and right paracolic gutter ascites.       He continues to tolerate treatment well.  He recently started Metformin for DM.  He is tolerating well with the usual side effects, loose stools and flatulence.  He notes intermittent nausea without vomiting.  He does not take his nausea medication and he is advised to take his antiemetic(s) at first sign of nausea.  He continues to drink EtOH having "a couple of beers" nightly.  He does not want to transition his care to [facility] at this time.  He wants to wait until his disability "goes through" so he will have a steady income.  Review of Systems  Constitutional: Negative.  Negative for chills, fever and weight loss.  HENT: Negative.   Eyes: Negative.   Respiratory: Negative.  Negative for cough, sputum production and shortness of breath.   Cardiovascular: Negative.  Negative for chest pain and leg swelling.  Gastrointestinal: Positive for  abdominal pain, diarrhea and nausea. Negative for constipation and vomiting.  Genitourinary: Negative.  Negative for dysuria.  Musculoskeletal: Negative.  Negative for falls.  Skin: Negative.   Neurological: Negative.  Negative for weakness.  Endo/Heme/Allergies: Negative.   Psychiatric/Behavioral: Negative.      Past Medical  History:  Diagnosis Date  . Alcohol abuse    been through rehab  . Allergy   . Anemia   . Asthma    childhood  . Chicken pox   . Chronic bronchitis ([place])   . Diabetes mellitus without complication ([place])   . Erectile dysfunction   . Esophageal cancer ([place]) 05/22/2016  . History of stomach ulcers   . Hypertension     Past Surgical History:  Procedure Laterality Date  . APPENDECTOMY  1978  . ESOPHAGOGASTRODUODENOSCOPY N/A 05/25/2016   Procedure: ESOPHAGOGASTRODUODENOSCOPY (EGD);  Surgeon: Daneil Dolin, MD;  Location: [facility];  Service: Endoscopy;  Laterality: N/A;  . HERNIA REPAIR      Family History  Problem Relation Age of Onset  . Hypertension Mother   . Diabetes Mother   . Cancer Neg Hx   . Heart disease Neg Hx   . Stroke Neg Hx     Social History   Social History  . Marital status: Single    Spouse name: N/A  . Number of children: 1  . Years of education: 6   Occupational History  . cook     disabled   Social History Main Topics  . Smoking status: Never Smoker  . Smokeless tobacco: Current User    Types: Chew     Comment: 5 years  . Alcohol use Yes     Comment: occ  . Drug use: No  . Sexual activity: Yes   Other Topics Concern  . None   Social History Narrative   Lives with mother, stays with fiancee during chemotherapy and medical treatments.        PHYSICAL EXAMINATION  ECOG PERFORMANCE STATUS: 1 - Symptomatic but completely ambulatory  Vitals:   11/01/16 0931  BP: 124/85  Pulse: 100  Resp: 18  Temp: 98.2 F (36.8 C)     GENERAL:alert, cachectic, smiling, appearing much improved, unaccompanied, and in chemo-bed, unaccompanied SKIN: skin color, texture, turgor are normal, no rashes or significant lesions HEAD: Normocephalic EYES: Conjunctiva are pink and non-injected EARS: External ears normal  OROPHARYNX:lips, buccal mucosa, and tongue normal and mucous membranes are moist  NECK: supple, trachea midline LYMPH:  not  examined BREAST:not examined LUNGS: clear to auscultation and percussion HEART: regular rate & rhythm without murmur, rub, or gallop. ABDOMEN:abdomen soft, distended, normal bowel sounds and hepatomegaly noted. BACK: Back symmetric, no curvature. EXTREMITIES:less then 2 second capillary refill, no skin discoloration, no cyanosis NEURO: alert & oriented x 3 with fluent speech, no focal motor/sensory deficits, gait normal   LABORATORY DATA: CBC    Component Value Date/Time   WBC 7.5 10/18/2016 0934   RBC 3.75 (L) 10/18/2016 0934   HGB 12.7 (L) 10/18/2016 0934   HGB 15.1 12/03/2014 1205   HCT 38.0 (L) 10/18/2016 0934   HCT 45.7 12/03/2014 1205   PLT 97 (L) 10/18/2016 0934   PLT 262 12/03/2014 1205   MCV 101.3 (H) 10/18/2016 0934   MCV 96 12/03/2014 1205   MCH 33.9 10/18/2016 0934   MCHC 33.4 10/18/2016 0934   RDW 16.0 (H) 10/18/2016 0934   RDW 13.3 12/03/2014 1205   LYMPHSABS 1.9 10/18/2016 0934   LYMPHSABS 1.0 01/14/2014  2000   MONOABS 1.5 (H) 10/18/2016 0934   MONOABS 1.0 01/14/2014 2000   EOSABS 0.1 10/18/2016 0934   EOSABS 0.0 01/14/2014 2000   BASOSABS 0.1 10/18/2016 0934   BASOSABS 0.0 01/14/2014 2000      Chemistry      Component Value Date/Time   NA 132 (L) 10/18/2016 0934   NA 137 12/03/2014 1205   K 3.5 10/18/2016 0934   K 3.8 12/03/2014 1205   CL 100 (L) 10/18/2016 0934   CL 97 (L) 12/03/2014 1205   CO2 26 10/18/2016 0934   CO2 30 12/03/2014 1205   BUN 5 (L) 10/18/2016 0934   BUN 8 12/03/2014 1205   CREATININE 0.47 (L) 10/18/2016 0934   CREATININE 0.69 12/03/2014 1205      Component Value Date/Time   CALCIUM 8.7 (L) 10/18/2016 0934   CALCIUM 9.1 12/03/2014 1205   ALKPHOS 153 (H) 10/18/2016 0934   ALKPHOS 71 12/03/2014 1205   AST 65 (H) 10/18/2016 0934   AST 51 (H) 12/03/2014 1205   ALT 27 10/18/2016 0934   ALT 42 12/03/2014 1205   BILITOT 2.3 (H) 10/18/2016 0934   BILITOT 0.9 12/03/2014 1205        PENDING LABS:   RADIOGRAPHIC  STUDIES:  No results found.   PATHOLOGY:    ASSESSMENT AND PLAN:  Esophageal cancer ([place]) Stage IV poorly differentiated squamous cell carcinoma of the esophagus with significant liver metastases.  Began palliative systemic chemotherapy on 05/31/2016 with FOLFOX.  Oncotype ID was sent off and reported back as Sarcoma.  This was discussed in [employer]  and pathology is very confident, as are we, that we are not dealing with a sarcoma.  Oncology history updated.  Labs today: CBC diff, CMET, Magnesium.  I personally reviewed and went over laboratory results with the patient.  The results are noted within this dictation.    He has followed-up with Dr. Meda Coffee as directed.  Her note is reviewed.  I will ask nutrition to see or make contact with the patient today.  He decided not to pursue a transfer of medical to Dr. Rogue Bussing at [facility] at this time.  He may do this in the future.  Pain is well controlled.  He denies any narcotic-induced constipation.  EtOH abstinence is encouraged.  He continues to consume "a couple of beers" per night.  He received his influenza and pneumonia immunization by primary care provider.  He will return in 2 and 4 weeks for his next cycles of chemotherapy and return for follow-up in 4 weeks.   He is due for repeat imaging at the end of December 2017 and therefore these can be scheduled at next follow-up appointment.   ORDERS PLACED FOR THIS ENCOUNTER: No orders of the defined types were placed in this encounter.   MEDICATIONS PRESCRIBED THIS ENCOUNTER: Meds ordered this encounter  Medications  . morphine (MSIR) 15 MG tablet    Sig: TK 1 T PO Q 6 H PRF SEVERE PAIN    Refill:  0    THERAPY PLAN:  Continue treatment as planned with supportive care.  All questions were answered. The patient knows to call the clinic with any problems, questions or concerns. We can certainly see the patient much sooner if necessary.  Patient and plan discussed with  Dr. Ancil Linsey and she is in agreement with the aforementioned.   This note is electronically signed by: Doy Mince 11/01/2016 10:11 AM

## 2016-11-01 NOTE — Treatment Plan (Signed)
Ok to treat with elevated bilirubin per Gershon Mussel today

## 2016-11-01 NOTE — Treatment Plan (Signed)
Ok to treat with elevated AST today perTom Kefalas. No dose adjustments needed

## 2016-11-01 NOTE — Assessment & Plan Note (Addendum)
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  Began palliative systemic chemotherapy on 05/31/2016 with FOLFOX.  Oncotype ID was sent off and reported back as Sarcoma.  This was discussed in [employer]  and pathology is very confident, as are we, that we are not dealing with a sarcoma.  Oncology history updated.  Labs today: CBC diff, CMET, Magnesium.  I personally reviewed and went over laboratory results with the patient.  The results are noted within this dictation.    He has followed-up with Dr. Meda Greene as directed.  Her note is reviewed.  I will ask nutrition to see or make contact with the patient today.  He decided not to pursue a transfer of medical to Dr. Rogue Greene at [facility] at this time.  He may do this in the future.  Pain is well controlled.  He denies any narcotic-induced constipation.  EtOH abstinence is encouraged.  He continues to consume "a couple of beers" per night.  He received his influenza and pneumonia immunization by primary care provider.  He will return in 2 and 4 weeks for his next cycles of chemotherapy and return for follow-up in 4 weeks.   We will manipulate his treatment plan for the upcoming Holiday season. He is due for repeat imaging at the end of December 2017 and therefore these can be scheduled at next follow-up appointment.

## 2016-11-01 NOTE — Progress Notes (Signed)
Labs reviewed with MD, proceed with treatment  Chemotherapy given today. Patient tolerated well with no problems.Continuous 5FU pump connected per orders. Vitals stable and discharged home ambulatory. Follow up as scheduled.

## 2016-11-02 ENCOUNTER — Encounter: Payer: Self-pay | Admitting: Dietician

## 2016-11-02 NOTE — Progress Notes (Signed)
Follow up with patient  RD had been following in past (last visit 8/2), but appeared to have stabilized. Today, PA consulted RD due to further wt loss.   Contacted Pt by Phone   Wt Readings from Last 10 Encounters:  11/01/16 161 lb (73 kg)  10/18/16 156 lb 6.4 oz (70.9 kg)  10/04/16 156 lb 1.9 oz (70.8 kg)  10/04/16 154 lb (69.9 kg)  09/20/16 156 lb (70.8 kg)  09/15/16 154 lb (69.9 kg)  09/06/16 161 lb 9.6 oz (73.3 kg)  08/23/16 161 lb (73 kg)  08/09/16 158 lb (71.7 kg)  07/26/16 169 lb 12.8 oz (77 kg)   Patient appears to have lost ~8 lbs since he was last seen. Wt appears to be hovering around 155-160 lbs. Important to note that wt is a poor nutritional indicator in this patient due to chronic, fluctuating edema.   In interim, he has been started on metformin due to development of diabetes.   Patient reports oral intake as fair. He is eating 2 meals.  He says he is doing ok. His stomach has been hurting him. He reports intermittently having both diarrhea and constipation.  Additionally, he has bee having some Nausea w/o vomiting. Echoed PAs recommendation of premedicating with his antiemetics. He did not know which medication this was.   Discussed with pt a little about the diabetic Diet and what foods he should prioritize vs limit. Recommended having protein/vegetables at each meal, limiting sodas/candy/ice cream and not skipping meals. He says he has been cutting back on the sodas. Emphasized that despite the importance of keeping his blood sugars in check, if he is ill and the sugary foods are the only ones that he can tolerate, he should eat them.   He has not been instructed to check his BG at home.    Pt has poor health literacy. As such, kept information very basic. He asked what color the anti-nausea pill was. He cannot pronounce the medication names and wants to bring all his medications to his appointment tomorrow to see which ones he needs.   Fiance also wanted to speak  with patient. She wanted to know "how he is looking". Reiterated the main topics we had discussed. Fiance states that pt does not eat 2 meals a day. He gets most of his meal at his moms house. She is a diabetic and, per her report ,cooks diabetic appropriate meals. She also feels that pt's sweets intake has decreased. She believes he turned to these initially due to his "messed up taste buds".   Patient asked to meet with RD tomorrow. Will present him with my contact info, coupons, and handouts titled Blood Glucose Management" at that time.   Burtis Junes RD, LDN, [place] Nutrition Pager: [phone] 11/02/2016 11:19 AM

## 2016-11-03 ENCOUNTER — Encounter (HOSPITAL_BASED_OUTPATIENT_CLINIC_OR_DEPARTMENT_OTHER): Payer: Medicaid Other

## 2016-11-03 ENCOUNTER — Encounter (HOSPITAL_COMMUNITY): Payer: Self-pay

## 2016-11-03 ENCOUNTER — Encounter: Payer: Self-pay | Admitting: Dietician

## 2016-11-03 VITALS — BP 130/78 | HR 105 | Temp 97.8°F | Resp 18

## 2016-11-03 DIAGNOSIS — C787 Secondary malignant neoplasm of liver and intrahepatic bile duct: Secondary | ICD-10-CM | POA: Diagnosis not present

## 2016-11-03 DIAGNOSIS — C155 Malignant neoplasm of lower third of esophagus: Secondary | ICD-10-CM | POA: Diagnosis present

## 2016-11-03 MED ORDER — SODIUM CHLORIDE 0.9% FLUSH
10.0000 mL | INTRAVENOUS | Status: DC | PRN
  Administered 2016-11-03: 10 mL
  Filled 2016-11-03: qty 10

## 2016-11-03 MED ORDER — HEPARIN SOD (PORK) LOCK FLUSH 100 UNIT/ML IV SOLN
500.0000 [IU] | Freq: Once | INTRAVENOUS | Status: AC | PRN
  Administered 2016-11-03: 500 [IU]
  Filled 2016-11-03: qty 5

## 2016-11-03 NOTE — Progress Notes (Signed)
  Wt Readings from Last 10 Encounters:  11/01/16 161 lb (73 kg)  10/18/16 156 lb 6.4 oz (70.9 kg)  10/04/16 156 lb 1.9 oz (70.8 kg)  10/04/16 154 lb (69.9 kg)  09/20/16 156 lb (70.8 kg)  09/15/16 154 lb (69.9 kg)  09/06/16 161 lb 9.6 oz (73.3 kg)  08/23/16 161 lb (73 kg)  08/09/16 158 lb (71.7 kg)  07/26/16 169 lb 12.8 oz (77 kg)   Patient weight is poor indicator of nutritional status gives his fluctuations in fluid. However wt does appear to be stabilizing  Followed up with pt today to quickly review the information that was discussed over the phone yesterday regarding managing hyperglycemia (see yesterdays note).  Essentially asked him to limit soda/juice/candy and other foods that are essentially only sugar. Asked him to eat protein frequently. He wants to continue eating his candy bars. Asked him to do this with meals.   His irregular eating pattern is something he does not seem willing to change as he has never had a habitual pattern; he will just eat something when he gets Hungry. RD recommended just keeping a beverage with him and sipping on it between meals.   Visually, his swelling his much improved. Hie BLE is almost completely gone. He does have some abdominal swelling still.   Left him with contact information, coupons for Glucerna and a handout on managing high blood sugars.  Burtis Junes RD, LDN, CNSC Clinical Nutrition Pager: [phone] 11/03/2016 2:16 PM

## 2016-11-03 NOTE — Progress Notes (Signed)
Joshua Greene returns today for port de access and flush after 46 hr continous infusion of 43f. Tolerated infusion without problems. Portacath located right chest wall was  deaccessed and flushed with 240mNS and 500U/3m27meparin and needle removed intact.  Procedure without incident. Patient tolerated procedure well.   Vitals stable, discharged from clinic ambulatory.follow up as scheduled.

## 2016-11-13 ENCOUNTER — Other Ambulatory Visit (HOSPITAL_COMMUNITY): Payer: Self-pay

## 2016-11-13 DIAGNOSIS — E876 Hypokalemia: Secondary | ICD-10-CM

## 2016-11-13 MED ORDER — POTASSIUM CHLORIDE CRYS ER 20 MEQ PO TBCR: 60.0000 meq | EXTENDED_RELEASE_TABLET | Freq: Every day | ORAL | 1 refills | Status: AC

## 2016-11-13 NOTE — Telephone Encounter (Signed)
Received refill request from patients pharmacy for potassium. Refilled per Kirby Crigler, PA-C.

## 2016-11-20 ENCOUNTER — Ambulatory Visit (HOSPITAL_COMMUNITY)
Admission: RE | Admit: 2016-11-20 | Discharge: 2016-11-20 | Disposition: A | Discharge status: Home / Self Care | Payer: Medicaid Other | Source: Ambulatory Visit | Attending: Oncology | Admitting: Oncology

## 2016-11-20 ENCOUNTER — Encounter (HOSPITAL_COMMUNITY): Payer: Medicaid Other | Attending: Hematology & Oncology

## 2016-11-20 ENCOUNTER — Encounter (HOSPITAL_COMMUNITY): Payer: Self-pay

## 2016-11-20 ENCOUNTER — Other Ambulatory Visit (HOSPITAL_COMMUNITY): Payer: Self-pay | Admitting: Oncology

## 2016-11-20 ENCOUNTER — Encounter (HOSPITAL_COMMUNITY): Payer: Medicaid Other | Admitting: Oncology

## 2016-11-20 VITALS — BP 136/87 | HR 107 | Temp 98.0°F | Resp 18 | Wt 167.0 lb

## 2016-11-20 DIAGNOSIS — F1010 Alcohol abuse, uncomplicated: Secondary | ICD-10-CM

## 2016-11-20 DIAGNOSIS — R188 Other ascites: Secondary | ICD-10-CM | POA: Diagnosis not present

## 2016-11-20 DIAGNOSIS — R140 Abdominal distension (gaseous): Secondary | ICD-10-CM | POA: Insufficient documentation

## 2016-11-20 DIAGNOSIS — C801 Malignant (primary) neoplasm, unspecified: Secondary | ICD-10-CM | POA: Insufficient documentation

## 2016-11-20 DIAGNOSIS — I81 Portal vein thrombosis: Secondary | ICD-10-CM

## 2016-11-20 DIAGNOSIS — C787 Secondary malignant neoplasm of liver and intrahepatic bile duct: Secondary | ICD-10-CM

## 2016-11-20 DIAGNOSIS — R748 Abnormal levels of other serum enzymes: Secondary | ICD-10-CM | POA: Diagnosis not present

## 2016-11-20 DIAGNOSIS — Z95828 Presence of other vascular implants and grafts: Secondary | ICD-10-CM

## 2016-11-20 DIAGNOSIS — C155 Malignant neoplasm of lower third of esophagus: Secondary | ICD-10-CM

## 2016-11-20 LAB — COMPREHENSIVE METABOLIC PANEL
ALT: 59 U/L (ref 17–63)
ANION GAP: 8 (ref 5–15)
AST: 205 U/L — ABNORMAL HIGH (ref 15–41)
Albumin: 3 g/dL — ABNORMAL LOW (ref 3.5–5.0)
Alkaline Phosphatase: 259 U/L — ABNORMAL HIGH (ref 38–126)
BUN: 5 mg/dL — ABNORMAL LOW (ref 6–20)
CHLORIDE: 97 mmol/L — AB (ref 101–111)
CO2: 27 mmol/L (ref 22–32)
Calcium: 8.7 mg/dL — ABNORMAL LOW (ref 8.9–10.3)
Creatinine, Ser: 0.42 mg/dL — ABNORMAL LOW (ref 0.61–1.24)
GFR calc non Af Amer: >60 mL/min (ref >60)
Glucose, Bld: 170 mg/dL — ABNORMAL HIGH (ref 65–99)
POTASSIUM: 4.2 mmol/L (ref 3.5–5.1)
SODIUM: 132 mmol/L — AB (ref 135–145)
Total Bilirubin: 3.3 mg/dL — ABNORMAL HIGH (ref 0.3–1.2)
Total Protein: 7.3 g/dL (ref 6.5–8.1)

## 2016-11-20 LAB — CBC WITH DIFFERENTIAL/PLATELET
Basophils Absolute: 0.1 10*3/uL (ref 0.0–0.1)
Basophils Relative: 1 %
EOS ABS: 0 10*3/uL (ref 0.0–0.7)
EOS PCT: 1 %
HCT: 37 % — ABNORMAL LOW (ref 39.0–52.0)
Hemoglobin: 11.9 g/dL — ABNORMAL LOW (ref 13.0–17.0)
LYMPHS ABS: 1.3 10*3/uL (ref 0.7–4.0)
Lymphocytes Relative: 26 %
MCH: 32.5 pg (ref 26.0–34.0)
MCHC: 32.2 g/dL (ref 30.0–36.0)
MCV: 101.1 fL — ABNORMAL HIGH (ref 78.0–100.0)
MONOS PCT: 27 %
Monocytes Absolute: 1.4 10*3/uL — ABNORMAL HIGH (ref 0.1–1.0)
Neutro Abs: 2.4 10*3/uL (ref 1.7–7.7)
Neutrophils Relative %: 45 %
PLATELETS: 128 10*3/uL — AB (ref 150–400)
RBC: 3.66 MIL/uL — ABNORMAL LOW (ref 4.22–5.81)
RDW: 16.1 % — ABNORMAL HIGH (ref 11.5–15.5)
WBC: 5.2 10*3/uL (ref 4.0–10.5)

## 2016-11-20 MED ORDER — HEPARIN SOD (PORK) LOCK FLUSH 100 UNIT/ML IV SOLN
500.0000 [IU] | Freq: Once | INTRAVENOUS | Status: AC
  Administered 2016-11-20: 500 [IU] via INTRAVENOUS

## 2016-11-20 MED ORDER — SODIUM CHLORIDE 0.9% FLUSH
10.0000 mL | Freq: Once | INTRAVENOUS | Status: AC
  Administered 2016-11-20: 10 mL via INTRAVENOUS

## 2016-11-20 MED ORDER — ENOXAPARIN SODIUM 40 MG/0.4ML ~~LOC~~ SOLN
110.0000 mg | SUBCUTANEOUS | Status: DC
  Filled 2016-11-20 (×2): qty 1.1

## 2016-11-20 MED ORDER — ENOXAPARIN SODIUM 120 MG/0.8ML ~~LOC~~ SOLN
110.0000 mg | Freq: Once | SUBCUTANEOUS | Status: AC
  Administered 2016-11-20: 110 mg via SUBCUTANEOUS
  Filled 2016-11-20: qty 0.8

## 2016-11-20 MED ORDER — HEPARIN SOD (PORK) LOCK FLUSH 100 UNIT/ML IV SOLN
INTRAVENOUS | Status: AC
  Filled 2016-11-20: qty 5

## 2016-11-20 NOTE — Progress Notes (Signed)
Patient is seen as a work-in today.  Nursing notes increased abdominal extension and changes in lab work.    He reports mild abdominal discomfort and reports increased size in his abdomen.  He denies any nausea or vomiting.  He continues to drink EtOH and reports two 40oz of EtOH per night.  Vitals - 1 value per visit 62/82/4175  SYSTOLIC 301  DIASTOLIC 87  Pulse 040  Temperature 98  Respirations 18  Weight (lb) 167  Gen: NAD, smiling, accompanied by his fiance. Skin: Warm and dry Cardiac: RRR Lungs: CTA B/L Abd: distended, hard, minimally tender throughout Neck: trachea midline Head: Atraumatic, normocephalic Neuro: A and O x 3 Eyes: scleral icterus noted.  Assessment: 1. Hyperbilirubinemia, progressive 2. Progressive elevation of AST and Alk Phos 3. Portal vein thrombosis   Plan: 1. Order placed for [place]  2. Order placed for paracentesis (if needed). 3. Discussed [place] results with Dr. Thornton Papas who relayed the new information to me. 4. I personally reviewed and went over laboratory results with the patient.  The results are noted within this dictation. 5. I personally reviewed and went over radiographic studies with the patient.  The results are noted within this dictation.   6. US demonstrates a new portal vein thrombosis.  Due to lack of insurance, Lovenox 1.5 mg/kg will be administered today and daily here, in the clinic.  May transition to Direct Xa inhibitor in near future if medication can be provided free. 7. Dr. Thornton Papas expresses concern for US findings that may make [place] think of progression of disease. 8. Chemotherapy deferred x 7 days 9. CT CAP with contrast ordered for this week. 10. Return this week to review imaging results and discuss medical oncology recommendations. 11.  Refraining from EtOH is strongly URGED.  More than 50% of the time spent with the patient was utilized for counseling and coordination of care.  Patient and plan discussed with Dr. Ancil Linsey and  she is in agreement with the aforementioned.   Robynn Pane, PA-C 11/20/2016 5:42 PM

## 2016-11-20 NOTE — Patient Instructions (Signed)
[place] at [facility] Discharge Instructions  RECOMMENDATIONS MADE BY THE CONSULTANT AND ANY TEST RESULTS WILL BE SENT TO YOUR REFERRING PHYSICIAN.  No chemotherapy today. Lovenox 110 mg injection given as ordered. Return as scheduled.  Thank you for choosing [place] at [facility] to provide your oncology and hematology care.  To afford each patient quality time with our provider, please arrive at least 15 minutes before your scheduled appointment time.   Beginning January 23rd 2017 lab work for the [facility] will be done in the  Main lab at [facility] on 1st floor. If you have a lab appointment with the [facility] please come in thru the  Main Entrance and check in at the main information desk  You need to re-schedule your appointment should you arrive 10 or more minutes late.  We strive to give you quality time with our providers, and arriving late affects you and other patients whose appointments are after yours.  Also, if you no show three or more times for appointments you may be dismissed from the clinic at the providers discretion.     Again, thank you for choosing [facility].  Our hope is that these requests will decrease the amount of time that you wait before being seen by our physicians.       _____________________________________________________________  Should you have questions after your visit to [facility], please contact our office at [phone] between the hours of 8:30 a.m. and 4:30 p.m.  Voicemails left after 4:30 p.m. will not be returned until the following business day.  For prescription refill requests, have your pharmacy contact our office.         Resources For Cancer Patients and their Caregivers ? American Cancer Society: Can assist with transportation, wigs, general needs, runs Look Good Feel Better.        959-097-0576 ? Cancer Care: Provides financial  assistance, online support groups, medication/co-pay assistance.  1-800-813-HOPE 726-170-7821) ? [place] Assists [place] cancer patients and their families through emotional , educational and financial support.  6151418974 ? [place] DSS Where to apply for food stamps, Medicaid and utility assistance. ([phone] 863-0941 ? RCATS: Transportation to medical appointments. ([phone] 747-1025 ? Social Security Administration: May apply for disability if have a Stage IV cancer. [phone] [phone] (854) [phone] ? [employer], Disability and Transit Services: Assists with nutrition, care and transit needs. [place] Support Programs: '@10RELATIVEDAYS'$ @ > Cancer Support Group  2nd Tuesday of the month 1pm-2pm, Journey Room  > Creative Journey  3rd Tuesday of the month 1130am-1pm, Journey Room  > Look Good Feel Better  1st Wednesday of the month 10am-12 noon, Journey Room (Call [place] to register [phone])

## 2016-11-20 NOTE — Progress Notes (Signed)
aptient has 6 lb weight gain. Abdomen tight and distended. Reports increased intermittent pain right lower quadrant, sometimes radiating to his back. States he usually on uses 1 pain pill per day in the morning. Rates pain at 6-7 when he takes pain medication and relief down to a 2-3 (tolerable). PA informed of patient symptoms/complaints.  PA in to discuss results of ultrasound with patient and his girlfriend.  No chemo treatment today.  Joshua Greene presented for Portacath access and flush. Proper placement of portacath confirmed by CXR. Portacath located right chest wall accessed with  H 20 needle. Good blood return present. Portacath flushed with 19m NS and 500U/597mHeparin and needle removed intact. Procedure without incident. Patient tolerated procedure well.  Joshua Greene today for injection per MD orders. Lovenox 110 mg administered SQ in right Abdomen. Administration without incident. Patient tolerated well.  Patient stable and ambulatory on discharge home with girlfriend.

## 2016-11-21 ENCOUNTER — Other Ambulatory Visit (HOSPITAL_COMMUNITY): Payer: Self-pay | Admitting: Oncology

## 2016-11-21 ENCOUNTER — Encounter (HOSPITAL_BASED_OUTPATIENT_CLINIC_OR_DEPARTMENT_OTHER): Payer: Medicaid Other

## 2016-11-21 DIAGNOSIS — C155 Malignant neoplasm of lower third of esophagus: Secondary | ICD-10-CM

## 2016-11-21 DIAGNOSIS — C787 Secondary malignant neoplasm of liver and intrahepatic bile duct: Secondary | ICD-10-CM

## 2016-11-21 DIAGNOSIS — I81 Portal vein thrombosis: Secondary | ICD-10-CM | POA: Diagnosis not present

## 2016-11-21 MED ORDER — ENOXAPARIN SODIUM 120 MG/0.8ML ~~LOC~~ SOLN
110.0000 mg | SUBCUTANEOUS | Status: DC
  Administered 2016-11-21: 110 mg via SUBCUTANEOUS
  Filled 2016-11-21 (×2): qty 0.8

## 2016-11-21 MED ORDER — MORPHINE SULFATE 15 MG PO TABS
15.0000 mg | ORAL_TABLET | Freq: Four times a day (QID) | ORAL | 0 refills | Status: DC | PRN
Start: 2016-11-21 — End: 2016-12-11

## 2016-11-21 NOTE — Progress Notes (Signed)
Joshua Greene [place] presents today for injection per MD orders. Lovenox '110mg'$  administered SQ in right Abdomen. Administration without incident. Patient tolerated well. Vitals stable and discharged home amublatory from clinic.

## 2016-11-21 NOTE — Patient Instructions (Signed)
[place] at [facility] Discharge Instructions  RECOMMENDATIONS MADE BY THE CONSULTANT AND ANY TEST RESULTS WILL BE SENT TO YOUR REFERRING PHYSICIAN.  Lovenox given today Follow up as scheduled  Thank you for choosing [place] at [facility] to provide your oncology and hematology care.  To afford each patient quality time with our provider, please arrive at least 15 minutes before your scheduled appointment time.   Beginning January 23rd 2017 lab work for the [facility] will be done in the  Main lab at [facility] on 1st floor. If you have a lab appointment with the [facility] please come in thru the  Main Entrance and check in at the main information desk  You need to re-schedule your appointment should you arrive 10 or more minutes late.  We strive to give you quality time with our providers, and arriving late affects you and other patients whose appointments are after yours.  Also, if you no show three or more times for appointments you may be dismissed from the clinic at the providers discretion.     Again, thank you for choosing [facility].  Our hope is that these requests will decrease the amount of time that you wait before being seen by our physicians.       _____________________________________________________________  Should you have questions after your visit to [facility], please contact our office at [phone] between the hours of 8:30 a.m. and 4:30 p.m.  Voicemails left after 4:30 p.m. will not be returned until the following business day.  For prescription refill requests, have your pharmacy contact our office.         Resources For Cancer Patients and their Caregivers ? American Cancer Society: Can assist with transportation, wigs, general needs, runs Look Good Feel Better.        ([phone] [phone] ? Cancer Care: Provides financial assistance, online support groups, medication/co-pay  assistance.  1-800-813-HOPE (204)307-8951) ? [place] Assists [place] cancer patients and their families through emotional , educational and financial support.  ([phone] ? [place] DSS Where to apply for food stamps, Medicaid and utility assistance. 872 494 2998 ? RCATS: Transportation to medical appointments. 269-441-1115 ? Social Security Administration: May apply for disability if have a Stage IV cancer. [phone] ? [employer], Disability and Transit Services: Assists with nutrition, care and transit needs. [place] Support Programs: '@10RELATIVEDAYS'$ @ > Cancer Support Group  2nd Tuesday of the month 1pm-2pm, Journey Room  > Creative Journey  3rd Tuesday of the month 1130am-1pm, Journey Room  > Look Good Feel Better  1st Wednesday of the month 10am-12 noon, Journey Room (Call [place] to register [phone])

## 2016-11-22 ENCOUNTER — Encounter (HOSPITAL_BASED_OUTPATIENT_CLINIC_OR_DEPARTMENT_OTHER): Payer: Medicaid Other

## 2016-11-22 ENCOUNTER — Ambulatory Visit (HOSPITAL_COMMUNITY)
Admission: RE | Admit: 2016-11-22 | Discharge: 2016-11-22 | Disposition: A | Discharge status: Home / Self Care | Payer: Medicaid Other | Source: Ambulatory Visit | Attending: Oncology | Admitting: Oncology

## 2016-11-22 ENCOUNTER — Encounter (HOSPITAL_COMMUNITY): Payer: Self-pay

## 2016-11-22 DIAGNOSIS — R188 Other ascites: Secondary | ICD-10-CM | POA: Diagnosis not present

## 2016-11-22 DIAGNOSIS — I8500 Esophageal varices without bleeding: Secondary | ICD-10-CM | POA: Insufficient documentation

## 2016-11-22 DIAGNOSIS — I700 Atherosclerosis of aorta: Secondary | ICD-10-CM | POA: Insufficient documentation

## 2016-11-22 DIAGNOSIS — C787 Secondary malignant neoplasm of liver and intrahepatic bile duct: Secondary | ICD-10-CM | POA: Insufficient documentation

## 2016-11-22 DIAGNOSIS — I2510 Atherosclerotic heart disease of native coronary artery without angina pectoris: Secondary | ICD-10-CM | POA: Diagnosis not present

## 2016-11-22 DIAGNOSIS — K766 Portal hypertension: Secondary | ICD-10-CM | POA: Insufficient documentation

## 2016-11-22 DIAGNOSIS — K7460 Unspecified cirrhosis of liver: Secondary | ICD-10-CM | POA: Diagnosis not present

## 2016-11-22 DIAGNOSIS — C7800 Secondary malignant neoplasm of unspecified lung: Secondary | ICD-10-CM | POA: Diagnosis not present

## 2016-11-22 DIAGNOSIS — C155 Malignant neoplasm of lower third of esophagus: Secondary | ICD-10-CM | POA: Diagnosis not present

## 2016-11-22 DIAGNOSIS — I81 Portal vein thrombosis: Secondary | ICD-10-CM | POA: Diagnosis present

## 2016-11-22 DIAGNOSIS — R590 Localized enlarged lymph nodes: Secondary | ICD-10-CM | POA: Insufficient documentation

## 2016-11-22 DIAGNOSIS — C7951 Secondary malignant neoplasm of bone: Secondary | ICD-10-CM | POA: Diagnosis not present

## 2016-11-22 MED ORDER — IOPAMIDOL (ISOVUE-300) INJECTION 61%
100.0000 mL | Freq: Once | INTRAVENOUS | Status: AC | PRN
Start: 2016-11-22 — End: 2016-11-22
  Administered 2016-11-22: 100 mL via INTRAVENOUS

## 2016-11-22 MED ORDER — ENOXAPARIN SODIUM 120 MG/0.8ML ~~LOC~~ SOLN
110.0000 mg | SUBCUTANEOUS | Status: DC
  Administered 2016-11-22: 110 mg via SUBCUTANEOUS
  Filled 2016-11-22 (×2): qty 0.8

## 2016-11-22 NOTE — Progress Notes (Signed)
Joshua Greene tolerated Lovenox injection well without complaints or incident. Pt denied any s/sx of abnormal bleeding. VSS Pt discharged self ambulatory in satisfactory condition

## 2016-11-22 NOTE — Patient Instructions (Signed)
[place] at [facility] Discharge Instructions  RECOMMENDATIONS MADE BY THE CONSULTANT AND ANY TEST RESULTS WILL BE SENT TO YOUR REFERRING PHYSICIAN.  Received Lovenox injection today. Follow-up as scheduled. Call clinic for any questions or concerns  Thank you for choosing [place] at [facility] to provide your oncology and hematology care.  To afford each patient quality time with our provider, please arrive at least 15 minutes before your scheduled appointment time.   Beginning January 23rd 2017 lab work for the [facility] will be done in the  Main lab at [facility] on 1st floor. If you have a lab appointment with the [facility] please come in thru the  Main Entrance and check in at the main information desk  You need to re-schedule your appointment should you arrive 10 or more minutes late.  We strive to give you quality time with our providers, and arriving late affects you and other patients whose appointments are after yours.  Also, if you no show three or more times for appointments you may be dismissed from the clinic at the providers discretion.     Again, thank you for choosing [facility].  Our hope is that these requests will decrease the amount of time that you wait before being seen by our physicians.       _____________________________________________________________  Should you have questions after your visit to [facility], please contact our office at [phone] between the hours of 8:30 a.m. and 4:30 p.m.  Voicemails left after 4:30 p.m. will not be returned until the following business day.  For prescription refill requests, have your pharmacy contact our office.         Resources For Cancer Patients and their Caregivers ? American Cancer Society: Can assist with transportation, wigs, general needs, runs Look Good Feel Better.        925-410-7849 ? Cancer Care: Provides  financial assistance, online support groups, medication/co-pay assistance.  1-800-813-HOPE 504-525-1411) ? [place] Assists [employer] [place] cancer patients and their families through emotional , educational and financial support.  480-005-5268 ? [place] DSS Where to apply for food stamps, Medicaid and utility assistance. 669-146-5659 ? RCATS: Transportation to medical appointments. [phone]) [phone] ? Social Security Administration: May apply for disability if have a Stage IV cancer. 437-364-0101 917-510-9611 ? [employer], Disability and Transit Services: Assists with nutrition, care and transit needs. [place] Support Programs: '@10RELATIVEDAYS'$ @ > Cancer Support Group  2nd Tuesday of the month 1pm-2pm, Journey Room  > Creative Journey  3rd Tuesday of the month 1130am-1pm, Journey Room  > Look Good Feel Better  1st Wednesday of the month 10am-12 noon, Journey Room (Call [place] to register [phone])

## 2016-11-23 ENCOUNTER — Ambulatory Visit (HOSPITAL_COMMUNITY): Payer: Self-pay

## 2016-11-23 ENCOUNTER — Encounter (HOSPITAL_COMMUNITY): Payer: Self-pay | Admitting: Oncology

## 2016-11-23 ENCOUNTER — Encounter (HOSPITAL_BASED_OUTPATIENT_CLINIC_OR_DEPARTMENT_OTHER): Payer: Medicaid Other | Admitting: Oncology

## 2016-11-23 ENCOUNTER — Encounter (HOSPITAL_BASED_OUTPATIENT_CLINIC_OR_DEPARTMENT_OTHER): Payer: Medicaid Other

## 2016-11-23 VITALS — BP 113/77 | HR 107 | Temp 98.0°F | Resp 20 | Wt 165.7 lb

## 2016-11-23 DIAGNOSIS — I81 Portal vein thrombosis: Secondary | ICD-10-CM

## 2016-11-23 DIAGNOSIS — R630 Anorexia: Secondary | ICD-10-CM

## 2016-11-23 DIAGNOSIS — C155 Malignant neoplasm of lower third of esophagus: Secondary | ICD-10-CM | POA: Diagnosis not present

## 2016-11-23 DIAGNOSIS — C787 Secondary malignant neoplasm of liver and intrahepatic bile duct: Secondary | ICD-10-CM | POA: Diagnosis not present

## 2016-11-23 HISTORY — DX: Portal vein thrombosis: I81

## 2016-11-23 MED ORDER — ENOXAPARIN SODIUM 120 MG/0.8ML ~~LOC~~ SOLN: 110.0000 mg | SUBCUTANEOUS | 0 refills | Status: DC

## 2016-11-23 MED ORDER — RIVAROXABAN 15 MG PO TABS: 15.0000 mg | ORAL_TABLET | Freq: Two times a day (BID) | ORAL | 0 refills | Status: DC

## 2016-11-23 MED ORDER — MEGESTROL ACETATE 400 MG/10ML PO SUSP: 400.0000 mg | Freq: Two times a day (BID) | ORAL | 2 refills | Status: AC

## 2016-11-23 MED ORDER — ENOXAPARIN SODIUM 40 MG/0.4ML ~~LOC~~ SOLN
110.0000 mg | SUBCUTANEOUS | Status: DC
  Filled 2016-11-23: qty 1.1

## 2016-11-23 MED ORDER — RIVAROXABAN 20 MG PO TABS: 20.0000 mg | ORAL_TABLET | Freq: Every day | ORAL | 3 refills | Status: DC

## 2016-11-23 MED ORDER — ENOXAPARIN SODIUM 120 MG/0.8ML ~~LOC~~ SOLN
110.0000 mg | SUBCUTANEOUS | Status: DC
  Administered 2016-11-23: 110 mg via SUBCUTANEOUS
  Filled 2016-11-23 (×2): qty 0.8

## 2016-11-23 NOTE — Patient Instructions (Addendum)
[place] at [facility] Discharge Instructions  RECOMMENDATIONS MADE BY THE CONSULTANT AND ANY TEST RESULTS WILL BE SENT TO YOUR REFERRING PHYSICIAN.  You saw Kirby Crigler, PA-C, today.  Start taking Megace to increase your appetite.  Start Xarelto 15 mg twice a day with food. (12 hours apart) you have samples of this medication.  Anderson Malta will call you about chemo teaching.  NO ALCOHOL of any kind.  Thank you for choosing [place] at [facility] to provide your oncology and hematology care.  To afford each patient quality time with our provider, please arrive at least 15 minutes before your scheduled appointment time.   Beginning January 23rd 2017 lab work for the [facility] will be done in the  Main lab at [facility] on 1st floor. If you have a lab appointment with the [facility] please come in thru the  Main Entrance and check in at the main information desk  You need to re-schedule your appointment should you arrive 10 or more minutes late.  We strive to give you quality time with our providers, and arriving late affects you and other patients whose appointments are after yours.  Also, if you no show three or more times for appointments you may be dismissed from the clinic at the providers discretion.     Again, thank you for choosing [facility].  Our hope is that these requests will decrease the amount of time that you wait before being seen by our physicians.       _____________________________________________________________  Should you have questions after your visit to [facility], please contact our office at [phone] between the hours of 8:30 a.m. and 4:30 p.m.  Voicemails left after 4:30 p.m. will not be returned until the following business day.  For prescription refill requests, have your pharmacy contact our office.         Resources For Cancer Patients and their  Caregivers ? American Cancer Society: Can assist with transportation, wigs, general needs, runs Look Good Feel Better.        [phone] ? Cancer Care: Provides financial assistance, online support groups, medication/co-pay assistance.  1-800-813-HOPE (424)229-0006) ? [place] Assists [place] cancer patients and their families through emotional , educational and financial support.  [phone] ? [place] DSS Where to apply for food stamps, Medicaid and utility assistance. (971) 586-1629 ? RCATS: Transportation to medical appointments. 410-403-7483 ? Social Security Administration: May apply for disability if have a Stage IV cancer. [phone] [phone] ? [employer], Disability and Transit Services: Assists with nutrition, care and transit needs. [place] Support Programs: '@10RELATIVEDAYS'$ @ > Cancer Support Group  2nd Tuesday of the month 1pm-2pm, Journey Room  > Creative Journey  3rd Tuesday of the month 1130am-1pm, Journey Room  > Look Good Feel Better  1st Wednesday of the month 10am-12 noon, Journey Room (Call [place] to register [phone])

## 2016-11-23 NOTE — Assessment & Plan Note (Signed)
Portal vein thrombosis, diagnosed on liver US on 11/20/2016.  Started on Lovenox daily.  Changed to Xarelto loading dose of 15 mg BID x 3 weeks on 11/24/2016.    Rx printed for 20 mg Xarelto and provided to Joshua Greene to investigate financial assistance for this medication.  Compliance with this medication is encouraged.  He is to take with food.

## 2016-11-23 NOTE — Progress Notes (Signed)
[place] presents today for injection per MD orders. Lovenox 110 mg administered SQ in left Abdomen. Administration without incident. Patient tolerated well.

## 2016-11-23 NOTE — Progress Notes (Signed)
Joshua Everts, MD [phone] [place] [address]  Malignant neoplasm of lower third of esophagus (HCC)  Portal vein thrombosis - Plan: Rivaroxaban (XARELTO) 15 MG TABS tablet, rivaroxaban (XARELTO) 20 MG TABS tablet  Decreased appetite - Plan: megestrol (MEGACE) 400 MG/10ML suspension  CURRENT THERAPY: FOLFOX beginning on 05/31/2016.  Now needing change in therapy.  INTERVAL HISTORY: Joshua Greene 55 y.o. 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    Esophageal cancer ([place])   05/04/2016 Miscellaneous    ED Visit secondary to bilateral ankle swelling      05/09/2016 Imaging    Enlarged liver with innumerable hepatic hypodense lesions most c/w metastatic disease. Diffuse mesenteric edema as well as multiple enlarged mesenteric and retroperitoneal LN      05/15/2016 Imaging    Two posterior RUL nodules, indeterminate, 17 mm short axis RL paraesophageal node worrisome for nodal metastasis. Site of primary remains indeterminate      05/16/2016 Initial Biopsy    Ultrasound guided biopsy of R sided liver lesion      05/16/2016 Pathology Results    Metastatic poorly differentiated carcinoma, immunoprofile not entirely specific. Favor poorly differentiated squamous cell carcinoma,       05/25/2016 Procedure    EGD with Dr. Gala Romney with esophageal varices, area of punch out ulceration that appears to be neoplastic      05/25/2016 Pathology Results    The neoplasm stained positive for CK5/6, p63 (squamous cell markers), MOC-31, CK7 (patchy). CDX-2 (weak), TTF-1 (non specific stain), and negative stains for CK20 and [place] A. The immunostain pattern and morphology favored poorly diff sq cell carcinoma esoph      05/29/2016 Procedure    Port-A-Cath placed by interventional radiology      05/31/2016 - 11/01/2016 Chemotherapy    FOLFOX      07/12/2016 Miscellaneous    Dx with  Enteropathogenic E coli.  Treated with Cipro x 10 days      09/18/2016 Imaging    CT CAP- Interval decrease in size of previously described right upper lobe pulmonary nodules. The liver is overall shrunken in appearance when compared to prior examination and lobular in contour, suggestive of treatment response of multiple hepatic lesions. Given the overall heterogeneity of the hepatic parenchyma, individual lesions are difficult to identify. Consider further evaluation with MRI as clinically indicated. Re- demonstrated sclerotic metastasis involving the spinous process of T1 and the left aspect of the sternum. Re- demonstrated multiple low-attenuation splenic lesions. Recommend attention on followup. Interval development of circumferential wall thickening of the cecum, ascending and transverse colon which may be secondary to an infectious or inflammatory process. New small volume perihepatic and right paracolic gutter ascites.      11/20/2016 Imaging    US abdomen- Widespread hepatic metastatic disease.  Portal vein thrombosis.  Minimal ascites.      11/20/2016 Treatment Plan Change    Tx deferred x 1 week due to increasing bilirubin      11/22/2016 Imaging    CT CAP- 1. Mild progression of pulmonary metastasis. 2. Development of mediastinal adenopathy, consistent with nodal metastasis. 3. Progression of relatively diffuse hepatic metastasis. 4. Cirrhosis. Portal venous hypertension with esophageal, gastric, and perirectal varices. Suspicion of left portal vein chronic thrombosis. Main portal vein is opacified. 5. Increased abdominal pelvic ascites. 6. Decreased right-sided colonic wall thickening which may be related to portal venous  hypertension. Infectious colitis cannot be excluded. 7. Similarly, gallbladder wall thickening is nonspecific in the setting of portal venous hypertension. 8. Similar splenic lesions which could represent metastasis or if patient is  immunocompromised, fungal infection. 9. Similar osseous metastasis. 10.  Coronary artery atherosclerosis. Aortic atherosclerosis.      11/23/2016 Progression    CT scan demonstrates progression of disease in conjunction with worsening liver function tests (complicated by ongoing EtOHism).      He is here to review recent restaging tests.  He reports ongoing difficulty with appetite.    He reports that he has not frank EtOH in 3-4 days.  Review of Systems  Constitutional: Negative.  Negative for chills and fever.  HENT: Negative.   Respiratory: Negative.  Negative for cough.   Cardiovascular: Negative.  Negative for chest pain.  Gastrointestinal: Positive for abdominal pain. Negative for constipation, diarrhea, nausea and vomiting.  Genitourinary: Negative.   Musculoskeletal: Negative.   Skin: Negative.   Neurological: Negative.   Endo/Heme/Allergies: Negative.   Psychiatric/Behavioral: Negative.     Past Medical History:  Diagnosis Date  . Alcohol abuse    been through rehab  . Allergy   . Anemia   . Asthma    childhood  . Chicken pox   . Chronic bronchitis ([place])   . Diabetes mellitus without complication ([place])   . Erectile dysfunction   . Esophageal cancer ([place]) 05/22/2016  . History of stomach ulcers   . Hypertension   . Portal vein thrombosis 11/23/2016    Past Surgical History:  Procedure Laterality Date  . APPENDECTOMY  1978  . ESOPHAGOGASTRODUODENOSCOPY N/A 05/25/2016   Procedure: ESOPHAGOGASTRODUODENOSCOPY (EGD);  Surgeon: Daneil Dolin, MD;  Location: [facility];  Service: Endoscopy;  Laterality: N/A;  . HERNIA REPAIR      Family History  Problem Relation Age of Onset  . Hypertension Mother   . Diabetes Mother   . Cancer Neg Hx   . Heart disease Neg Hx   . Stroke Neg Hx     Social History   Social History  . Marital status: Single    Spouse name: N/A  . Number of children: 1  . Years of education: 40   Occupational History  . cook      disabled   Social History Main Topics  . Smoking status: Never Smoker  . Smokeless tobacco: Current User    Types: Chew     Comment: 5 years  . Alcohol use Yes     Comment: occ  . Drug use: No  . Sexual activity: Yes   Other Topics Concern  . Not on file   Social History Narrative   Lives with mother, stays with fiancee during chemotherapy and medical treatments.        PHYSICAL EXAMINATION  ECOG PERFORMANCE STATUS: 1 - Symptomatic but completely ambulatory  Vitals:   11/23/16 1505  BP: 113/77  Pulse: (!) 107  Resp: 20  Temp: 98 F (36.7 C)    GENERAL:alert, no distress, comfortable, cooperative, smiling and accompanied by his fiance and granddaughter. SKIN: skin color, texture, turgor are normal, no rashes or significant lesions HEAD: Normocephalic, No masses, lesions, tenderness or abnormalities EYES: normal, EOMI, scleral icterus EARS: External ears normal OROPHARYNX:mucous membranes are moist  NECK: supple, trachea midline LYMPH:  not examined BREAST:not examined LUNGS: not examined HEART: not examined ABDOMEN:abdomen soft, normal bowel sounds and distended BACK: Back symmetric, no curvature. EXTREMITIES:less then 2 second capillary refill, no joint deformities, effusion,  or inflammation, no skin discoloration, no cyanosis  NEURO: alert & oriented x 3 with fluent speech, no focal motor/sensory deficits, gait normal   LABORATORY DATA: CBC    Component Value Date/Time   WBC 5.2 11/20/2016 0833   RBC 3.66 (L) 11/20/2016 0833   HGB 11.9 (L) 11/20/2016 0833   HGB 15.1 12/03/2014 1205   HCT 37.0 (L) 11/20/2016 0833   HCT 45.7 12/03/2014 1205   PLT 128 (L) 11/20/2016 0833   PLT 262 12/03/2014 1205   MCV 101.1 (H) 11/20/2016 0833   MCV 96 12/03/2014 1205   MCH 32.5 11/20/2016 0833   MCHC 32.2 11/20/2016 0833   RDW 16.1 (H) 11/20/2016 0833   RDW 13.3 12/03/2014 1205   LYMPHSABS 1.3 11/20/2016 0833   LYMPHSABS 1.0 01/14/2014 2000   MONOABS 1.4 (H)  11/20/2016 0833   MONOABS 1.0 01/14/2014 2000   EOSABS 0.0 11/20/2016 0833   EOSABS 0.0 01/14/2014 2000   BASOSABS 0.1 11/20/2016 0833   BASOSABS 0.0 01/14/2014 2000      Chemistry      Component Value Date/Time   NA 132 (L) 11/20/2016 0833   NA 137 12/03/2014 1205   K 4.2 11/20/2016 0833   K 3.8 12/03/2014 1205   CL 97 (L) 11/20/2016 0833   CL 97 (L) 12/03/2014 1205   CO2 27 11/20/2016 0833   CO2 30 12/03/2014 1205   BUN 5 (L) 11/20/2016 0833   BUN 8 12/03/2014 1205   CREATININE 0.42 (L) 11/20/2016 0833   CREATININE 0.69 12/03/2014 1205      Component Value Date/Time   CALCIUM 8.7 (L) 11/20/2016 0833   CALCIUM 9.1 12/03/2014 1205   ALKPHOS 259 (H) 11/20/2016 0833   ALKPHOS 71 12/03/2014 1205   AST 205 (H) 11/20/2016 0833   AST 51 (H) 12/03/2014 1205   ALT 59 11/20/2016 0833   ALT 42 12/03/2014 1205   BILITOT 3.3 (H) 11/20/2016 0833   BILITOT 0.9 12/03/2014 1205        PENDING LABS:   RADIOGRAPHIC STUDIES:  Ct Chest W Contrast  Result Date: 11/23/2016 CLINICAL DATA:  Distal esophageal cancer. Restaging. Currently on chemotherapy. Diabetes. Chronic bronchitis. Appendectomy. Alcohol abuse. EXAM: CT CHEST, ABDOMEN, AND PELVIS WITH CONTRAST TECHNIQUE: Multidetector CT imaging of the chest, abdomen and pelvis was performed following the standard protocol during bolus administration of intravenous contrast. CONTRAST:  132m ISOVUE-300 IOPAMIDOL (ISOVUE-300) INJECTION 61% COMPARISON:  09/18/2016 FINDINGS: CT CHEST FINDINGS Cardiovascular: Right Port-A-Cath which terminates at the high right atrium. Normal heart size. Multivessel coronary artery atherosclerosis. No central pulmonary embolism, on this non-dedicated study. Esophageal varices, including on image 42/ series 2, similar. Mediastinum/Nodes: Mild left gynecomastia. No hilar adenopathy. A node within the azygoesophageal recess is enlarged, 1.5 cm on image 38/series 2. Mild distal esophageal wall thickening is similar.  Lungs/Pleura: No pleural fluid. Mild centrilobular emphysema. Right apical pulmonary nodule is new at 6 mm on image 23/series 3. Posterior subpleural right upper lobe nodule is increased at 5 mm today versus 4 mm on the prior. Example image 49/series 3. New pleural-based left upper lobe pulmonary nodule measures 5 mm on image 67/series 3. Lateral left upper lobe 3 mm pulmonary nodule is minimally increased on image 85/series 3. Musculoskeletal: Remote trauma involving right-sided ribs and medial clavicle. Similar spinous process sclerotic metastasis at T1. Left-sided sternal metastasis is also not significantly changed. CT ABDOMEN PELVIS FINDINGS Hepatobiliary: Cirrhotic appearance the liver, with an irregular capsule. Widespread, relatively diffuse hepatic metastasis. Progressive, but difficult  to quantify secondary to relative diffuse appearance. Index lesion in the lateral segment left liver lobe measures 2.0 cm on image 54/series 2 versus 1.8 cm on the prior exam (when remeasured). A new or significantly enlarged lesion in the hepatic dome, compressing the intrahepatic IVC. Example at 3.4 cm on image 43/series 2. Mild gallbladder wall thickening, without calcified stone. Example image 65/series 2. No biliary duct dilatation. Pancreas: Pancreatic atrophy, without duct dilatation or definite acute inflammation. Spleen: Similar appearance of multiple hypo attenuating splenic lesions. Example a 1.4 cm on image 50/series 2 versus 1.3 cm previously. Adrenals/Urinary Tract: Normal adrenal glands. Right lower pole renal lesion is too small to characterize. Normal left kidney, without hydronephrosis. Normal urinary bladder. Stomach/Bowel: Stomach is primarily underdistended. No gross abnormality identified. Suspect perirectal varices. Right-sided mild colonic wall thickening, including on image 81/series 2. This is slightly improved. Normal terminal ileum. Normal small bowel. Vascular/Lymphatic: Aortic and branch vessel  atherosclerosis. Main portal vein and superior mesenteric vein are patent. Left portal vein is not well visualized and may be thrombosed, given appearance on ultrasound. There is a diminutive proximal right portal vein. Portosystemic collaterals including gastric varices. No abdominopelvic adenopathy. Reproductive: Normal prostate. Other: Slight increase in small to moderate volume abdominal pelvic ascites. Gas about the right anterior abdominal wall, including on image 90/series 2. Fluid containing periumbilical ventral wall hernia. Musculoskeletal: Remote trauma involving the left hemipelvis. IMPRESSION: 1. Mild progression of pulmonary metastasis. 2. Development of mediastinal adenopathy, consistent with nodal metastasis. 3. Progression of relatively diffuse hepatic metastasis. 4. Cirrhosis. Portal venous hypertension with esophageal, gastric, and perirectal varices. Suspicion of left portal vein chronic thrombosis. Main portal vein is opacified. 5. Increased abdominal pelvic ascites. 6. Decreased right-sided colonic wall thickening which may be related to portal venous hypertension. Infectious colitis cannot be excluded. 7. Similarly, gallbladder wall thickening is nonspecific in the setting of portal venous hypertension. 8. Similar splenic lesions which could represent metastasis or if patient is immunocompromised, fungal infection. 9. Similar osseous metastasis. 10.  Coronary artery atherosclerosis. Aortic atherosclerosis. Electronically Signed   By: Abigail Miyamoto M.D.   On: 11/23/2016 09:57   Ct Abdomen Pelvis W Contrast  Result Date: 11/23/2016 CLINICAL DATA:  Distal esophageal cancer. Restaging. Currently on chemotherapy. Diabetes. Chronic bronchitis. Appendectomy. Alcohol abuse. EXAM: CT CHEST, ABDOMEN, AND PELVIS WITH CONTRAST TECHNIQUE: Multidetector CT imaging of the chest, abdomen and pelvis was performed following the standard protocol during bolus administration of intravenous contrast. CONTRAST:   135m ISOVUE-300 IOPAMIDOL (ISOVUE-300) INJECTION 61% COMPARISON:  09/18/2016 FINDINGS: CT CHEST FINDINGS Cardiovascular: Right Port-A-Cath which terminates at the high right atrium. Normal heart size. Multivessel coronary artery atherosclerosis. No central pulmonary embolism, on this non-dedicated study. Esophageal varices, including on image 42/ series 2, similar. Mediastinum/Nodes: Mild left gynecomastia. No hilar adenopathy. A node within the azygoesophageal recess is enlarged, 1.5 cm on image 38/series 2. Mild distal esophageal wall thickening is similar. Lungs/Pleura: No pleural fluid. Mild centrilobular emphysema. Right apical pulmonary nodule is new at 6 mm on image 23/series 3. Posterior subpleural right upper lobe nodule is increased at 5 mm today versus 4 mm on the prior. Example image 49/series 3. New pleural-based left upper lobe pulmonary nodule measures 5 mm on image 67/series 3. Lateral left upper lobe 3 mm pulmonary nodule is minimally increased on image 85/series 3. Musculoskeletal: Remote trauma involving right-sided ribs and medial clavicle. Similar spinous process sclerotic metastasis at T1. Left-sided sternal metastasis is also not significantly changed. CT ABDOMEN PELVIS FINDINGS Hepatobiliary:  Cirrhotic appearance the liver, with an irregular capsule. Widespread, relatively diffuse hepatic metastasis. Progressive, but difficult to quantify secondary to relative diffuse appearance. Index lesion in the lateral segment left liver lobe measures 2.0 cm on image 54/series 2 versus 1.8 cm on the prior exam (when remeasured). A new or significantly enlarged lesion in the hepatic dome, compressing the intrahepatic IVC. Example at 3.4 cm on image 43/series 2. Mild gallbladder wall thickening, without calcified stone. Example image 65/series 2. No biliary duct dilatation. Pancreas: Pancreatic atrophy, without duct dilatation or definite acute inflammation. Spleen: Similar appearance of multiple hypo  attenuating splenic lesions. Example a 1.4 cm on image 50/series 2 versus 1.3 cm previously. Adrenals/Urinary Tract: Normal adrenal glands. Right lower pole renal lesion is too small to characterize. Normal left kidney, without hydronephrosis. Normal urinary bladder. Stomach/Bowel: Stomach is primarily underdistended. No gross abnormality identified. Suspect perirectal varices. Right-sided mild colonic wall thickening, including on image 81/series 2. This is slightly improved. Normal terminal ileum. Normal small bowel. Vascular/Lymphatic: Aortic and branch vessel atherosclerosis. Main portal vein and superior mesenteric vein are patent. Left portal vein is not well visualized and may be thrombosed, given appearance on ultrasound. There is a diminutive proximal right portal vein. Portosystemic collaterals including gastric varices. No abdominopelvic adenopathy. Reproductive: Normal prostate. Other: Slight increase in small to moderate volume abdominal pelvic ascites. Gas about the right anterior abdominal wall, including on image 90/series 2. Fluid containing periumbilical ventral wall hernia. Musculoskeletal: Remote trauma involving the left hemipelvis. IMPRESSION: 1. Mild progression of pulmonary metastasis. 2. Development of mediastinal adenopathy, consistent with nodal metastasis. 3. Progression of relatively diffuse hepatic metastasis. 4. Cirrhosis. Portal venous hypertension with esophageal, gastric, and perirectal varices. Suspicion of left portal vein chronic thrombosis. Main portal vein is opacified. 5. Increased abdominal pelvic ascites. 6. Decreased right-sided colonic wall thickening which may be related to portal venous hypertension. Infectious colitis cannot be excluded. 7. Similarly, gallbladder wall thickening is nonspecific in the setting of portal venous hypertension. 8. Similar splenic lesions which could represent metastasis or if patient is immunocompromised, fungal infection. 9. Similar osseous  metastasis. 10.  Coronary artery atherosclerosis. Aortic atherosclerosis. Electronically Signed   By: Abigail Miyamoto M.D.   On: 11/23/2016 09:57   US Abdomen Limited Ruq  Result Date: 11/20/2016 CLINICAL DATA:  Esophageal cancer, liver metastases, abdominal distension, worsening LFTs, asthma, hypertension, diabetes mellitus, ethanol abuse EXAM: US ABDOMEN LIMITED - RIGHT UPPER QUADRANT COMPARISON:  CT abdomen 09/18/2016 FINDINGS: Gallbladder: Contracted with thickened wall. No definite shadowing calculi or sonographic Murphy sign. Common bile duct: Diameter: 4 mm diameter, normal Liver: Heterogeneous appearance with innumerable nodular foci compatible with widespread metastatic disease. No blood flow identified within portal vein on color Doppler imaging compatible with portal vein thrombosis. Small amount of scattered ascites throughout abdomen. IMPRESSION: Widespread hepatic metastatic disease. Portal vein thrombosis. Minimal ascites. Findings called to Kirby Crigler PA on 11/20/2016 at 1033 hours. Electronically Signed   By: Lavonia Dana M.D.   On: 11/20/2016 10:35     PATHOLOGY:    ASSESSMENT AND PLAN:  Esophageal cancer ([place]) Stage IV poorly differentiated squamous cell carcinoma of the esophagus with significant liver metastases.  Began palliative systemic chemotherapy on 05/31/2016 with FOLFOX.  Oncotype ID was sent off and reported back as Sarcoma.  This was discussed in [facility]  and pathology is very confident, as are we, that we are not dealing with a sarcoma.  Now with imaging on 11/22/2016 demonstrating progression of disease.  Oncology history updated.  No role for labs today.  Tx last on 11/20/2016 was cancelled due to increasing liver tests concerning for progression of disease.  He was scheduled for restaging CT scans with a short interval follow-up appointment.  I personally reviewed and went over radiographic studies with the patient.  The results are noted within this  dictation.  CT scan demonstrates progression of disease.  With progression of disease, we discussed options regarding change in therapy.  We discussed a change in therapy to Carboplatin/Paclitaxel in a day 1, 8, 15 every 28 day fashion.  We discussed risks, benefits, alternatives, and side effects of therapy including, but not limited to, nausea/vomiting, alopecia, decreased blood counts, increased risk for infection, peripheral neuropathy, nail changes, diarrhea/contisption, bone pain, anaphylaxis, death.  He has been referred to [facility] for chemotherapy teaching.  Once completed, we will start therapy.  I anticipate starting next week.  Due to his decreased appetite, will try Megace therapy.  Rx is printed and provided to the patient.  Since he is on anticoagulation, risk of VTE is low with this medication.  EtOH abstinence is encouraged. He reports refraining from EtOH consumption the past 3-4 days.  Portal vein thrombosis Portal vein thrombosis, diagnosed on liver US on 11/20/2016.  Started on Lovenox daily.  Changed to Xarelto loading dose of 15 mg BID x 3 weeks on 11/24/2016.    Rx printed for 20 mg Xarelto and provided to Lendell Caprice to investigate financial assistance for this medication.  Compliance with this medication is encouraged.  He is to take with food.   ORDERS PLACED FOR THIS ENCOUNTER: No orders of the defined types were placed in this encounter.   MEDICATIONS PRESCRIBED THIS ENCOUNTER: Meds ordered this encounter  Medications  . prochlorperazine (COMPAZINE) 10 MG tablet    Sig: Take 10 mg by mouth every 6 (six) hours as needed for nausea or vomiting.  . ondansetron (ZOFRAN-ODT) 8 MG disintegrating tablet    Sig: Take 8 mg by mouth every 8 (eight) hours as needed for nausea or vomiting.  . Rivaroxaban (XARELTO) 15 MG TABS tablet    Sig: Take 1 tablet (15 mg total) by mouth 2 (two) times daily with a meal.    Dispense:  42 tablet    Refill:  0    Order Specific  Question:   Supervising Provider    Answer:   Patrici Ranks [id number]  . rivaroxaban (XARELTO) 20 MG TABS tablet    Sig: Take 1 tablet (20 mg total) by mouth daily with supper.    Dispense:  30 tablet    Refill:  3    Order Specific Question:   Supervising Provider    Answer:   Patrici Ranks [id number]  . megestrol (MEGACE) 400 MG/10ML suspension    Sig: Take 10 mLs (400 mg total) by mouth 2 (two) times daily.    Dispense:  240 mL    Refill:  2    Order Specific Question:   Supervising Provider    Answer:   Patrici Ranks [id number]    THERAPY PLAN:  Change in therapy to Carboplatin/Paclitaxel in a day 1, 8, 15 every 28 day fashion.    All questions were answered. The patient knows to call the clinic with any problems, questions or concerns. We can certainly see the patient much sooner if necessary.  Patient and plan discussed with Dr. Ancil Linsey and she is in agreement with the aforementioned.   This note is electronically signed by:  Robynn Pane, PA-C 11/23/2016 5:57 PM

## 2016-11-23 NOTE — Assessment & Plan Note (Addendum)
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  Began palliative systemic chemotherapy on 05/31/2016 with FOLFOX.  Oncotype ID was sent off and reported back as Sarcoma.  This was discussed in [employer]  and pathology is very confident, as are we, that we are not dealing with a sarcoma.  Now with imaging on 11/22/2016 demonstrating progression of disease.  Oncology history updated.  No role for labs today.  Tx last on 11/20/2016 was cancelled due to increasing liver tests concerning for progression of disease.  He was scheduled for restaging CT scans with a short interval follow-up appointment.  I personally reviewed and went over radiographic studies with the patient.  The results are noted within this dictation.  CT scan demonstrates progression of disease.  With progression of disease, we discussed options regarding change in therapy.  We discussed a change in therapy to Carboplatin/Paclitaxel in a day 1, 8, 15 every 28 day fashion.  We discussed risks, benefits, alternatives, and side effects of therapy including, but not limited to, nausea/vomiting, alopecia, decreased blood counts, increased risk for infection, peripheral neuropathy, nail changes, diarrhea/contisption, bone pain, anaphylaxis, death.  He has been referred to [facility] for chemotherapy teaching.  Once completed, we will start therapy.  I anticipate starting next week.  Due to his decreased appetite, will try Megace therapy.  Rx is printed and provided to the patient.  Since he is on anticoagulation, risk of VTE is low with this medication.  EtOH abstinence is encouraged. He reports refraining from EtOH consumption the past 3-4 days.

## 2016-11-23 NOTE — Patient Instructions (Signed)
[place] at [facility] Discharge Instructions  RECOMMENDATIONS MADE BY THE CONSULTANT AND ANY TEST RESULTS WILL BE SENT TO YOUR REFERRING PHYSICIAN.  Lovenox given today per orders. Follow up as scheduled.   Thank you for choosing [place] at [facility] to provide your oncology and hematology care.  To afford each patient quality time with our provider, please arrive at least 15 minutes before your scheduled appointment time.   Beginning January 23rd 2017 lab work for the [facility] will be done in the  Main lab at [facility] on 1st floor. If you have a lab appointment with the [facility] please come in thru the  Main Entrance and check in at the main information desk  You need to re-schedule your appointment should you arrive 10 or more minutes late.  We strive to give you quality time with our providers, and arriving late affects you and other patients whose appointments are after yours.  Also, if you no show three or more times for appointments you may be dismissed from the clinic at the providers discretion.     Again, thank you for choosing [facility].  Our hope is that these requests will decrease the amount of time that you wait before being seen by our physicians.       _____________________________________________________________  Should you have questions after your visit to [facility], please contact our office at [phone] between the hours of 8:30 a.m. and 4:30 p.m.  Voicemails left after 4:30 p.m. will not be returned until the following business day.  For prescription refill requests, have your pharmacy contact our office.         Resources For Cancer Patients and their Caregivers ? American [employer] Society: Can assist with transportation, wigs, general needs, runs Look Good Feel Better.        (647) 709-3818 ? Cancer Care: Provides financial assistance, online support groups,  medication/co-pay assistance.  1-800-813-HOPE 609-149-2168) ? [place] Assists [place] cancer patients and their families through emotional , educational and financial support.  972-544-8943 ? [place] DSS Where to apply for food stamps, Medicaid and utility assistance. 442-792-2059 ? [facility]: Transportation to medical appointments. 7871044392 ? Social Security Administration: May apply for disability if have a Stage IV cancer. 2316242247 (719) 777-8885 ? [employer], Disability and Transit Services: Assists with nutrition, care and transit needs. [place] Support Programs: '@10RELATIVEDAYS'$ @ > Cancer Support Group  2nd Tuesday of the month 1pm-2pm, Journey Room  > Creative Journey  3rd Tuesday of the month 1130am-1pm, Journey Room  > Look Good Feel Better  1st Wednesday of the month 10am-12 noon, Journey Room (Call Gregory to register [phone])

## 2016-11-24 ENCOUNTER — Ambulatory Visit (HOSPITAL_COMMUNITY): Payer: Self-pay

## 2016-11-24 ENCOUNTER — Telehealth (HOSPITAL_COMMUNITY): Payer: Self-pay | Admitting: Emergency Medicine

## 2016-11-24 NOTE — Telephone Encounter (Signed)
Called pt to let him know that chemotherapy teaching would be 11/29/2016 at 11:30 am.  Pt verbalized understanding.

## 2016-11-27 ENCOUNTER — Ambulatory Visit (HOSPITAL_COMMUNITY): Payer: Self-pay

## 2016-11-28 ENCOUNTER — Encounter (HOSPITAL_COMMUNITY): Payer: Self-pay | Admitting: Emergency Medicine

## 2016-11-28 NOTE — Progress Notes (Signed)
Chemotherapy teaching pulled together.  appts made. 

## 2016-11-28 NOTE — Patient Instructions (Addendum)
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   We are switching your treatment to carboplatin and taxol.  This is because you had progression on your last scans.  This treatment is with palliative intent, which means you are treatable but not curable.  This treatment is day 1, day 8, day 15, every 28 days.  You will be treated for 3 weeks in a row with a week break.   You will see the doctor regularly throughout treatment.  We monitor your lab work prior to every treatment.  The doctor monitors your response to treatment by the way you are feeling, your blood work, and scans periodically.  You will have the following premedications prior to receiving chemotherapy: Premeds: Benadryl:  Help prevent a reaction to the chemotherapy.  Pepcid: antihistamine premed Aloxi - high powered nausea/vomiting prevention medication used for chemotherapy patients.  Dexamethasone - steroid - given to reduce the risk of you having an allergic type reaction to the chemotherapy. Dex can cause you to feel energized, nervous/anxious/jittery, make you have trouble sleeping, and/or make you feel hot/flushed in the face/neck and/or look pink/red in the face/neck. These side effects will pass as the Dex wears off. (takes 20 minutes to infuse)  POTENTIAL SIDE EFFECTS OF TREATMENT:  Carboplatin (Generic Name) Other Names: Paraplatin, CBDCA  About This Drug Carboplatin is a drug used to treat cancer. This drug is given in the vein (IV). This drug takes about 30 minutes to infuse.   Possible Side Effects (More Common) . Nausea and throwing up (vomiting). These symptoms may happen within a few hours after your treatment and may last up to 24 hours. Medicines are available to stop or lessen these side effects. . Bone marrow depression. This is a decrease in the number of white blood  cells, red blood cells, and platelets. This may raise your risk of infection, make you tired and weak (fatigue), and raise your risk of bleeding. . Soreness of the mouth and throat. You may have red areas, white patches, or sores that hurt. . This drug may affect how your kidneys work. Your kidney function will be checked as needed. . Electrolyte changes. Your blood will be checked for electrolyte changes as needed.  Side Effects (Less Common) . Hair loss. Some patients lose their hair on the scalp and body. You may notice your hair thinning seven to 14 days after getting this drug. . Effects on the nerves are called peripheral neuropathy. You may feel numbness, tingling, or pain in your hands and feet. It may be hard for you to button your clothes, open jars, or walk as usual. The effect on the nerves may get worse with more doses of the drug. These effects get better in some people after the drug is stopped but it does not get better in all people. . Loose bowel movements (diarrhea) that may last for several days . Decreased hearing or ringing in the ears . Changes in the way food and drinks taste . Changes in liver function. Your liver function will be checked as needed.  Allergic Reactions Serious allergic reactions including anaphylaxis are rare. While you are getting this drug in your vein (IV), tell your nurse right away if you have any of these symptoms of an allergic reaction: . Trouble catching your breath . Feeling like your tongue or throat are swelling . Feeling your  heart beat quickly or in a not normal way (palpitations) . Feeling dizzy or lightheaded . Flushing, itching, rash, and/or hives  Treating Side Effects . Drink 6-8 cups of fluids each day unless your doctor has told you to limit your fluid intake due to some other health problem. A cup is 8 ounces of fluid. If you throw up or have loose bowel movements, you should drink more fluids so that you do not become dehydrated  (lack water in the body from losing too much fluid). . Mouth care is very important. Your mouth care should consist of routine, gentle cleaning of your teeth or dentures and rinsing your mouth with a mixture of 1/2 teaspoon of salt in 8 ounces of water or  teaspoon of baking soda in 8 ounces of water. This should be done at least after each meal and at bedtime. . If you have mouth sores, avoid mouthwash that has alcohol. Avoid alcohol and smoking because they can bother your mouth and throat. . If you have numbness and tingling in your hands and feet, be careful when cooking, walking, and handling sharp objects and hot liquids. . Talk with your nurse about getting a wig before you lose your hair. Also, call the [place] at 800-ACS-2345 to find out information about the "Look Good, Feel Better" program close to where you live. It is a free program where women getting chemotherapy can learn about wigs, turbans and scarves as well as makeup techniques and skin and nail care.  Food and Drug Interactions There are no known interactions of carboplatin with food. This drug may interact with other medicines. Tell your doctor and pharmacist about all the medicines and dietary supplements (vitamins, minerals, herbs and others) that you are taking at this time. The safety and use of dietary supplements and alternative diets are often not known. Using these might affect your cancer or interfere with your treatment. Until more is known, you should not use dietary supplements or alternative diets without your doctor's help.  When to Call the Doctor Call your doctor or nurse right away if you have any of these symptoms: . Fever of 100.5 F (38 C) or above; chills . Bleeding or bruising that is not normal . Wheezing or trouble breathing . Nausea that stops you from eating or drinking . Throwing up more than once a day . Rash or itching . Loose bowel movements (diarrhea) more than four times a day or  diarrhea with weakness or feeling lightheaded . Call your doctor or nurse as soon as possible if any of these symptoms happen: . Numbness, tingling, decreased feeling or weakness in fingers, toes, arms, or legs . Change in hearing, ringing in the ears . Blurred vision or other changes in eyesight . Decreased urine . Yellowing of skin or eyes  Problems and Reproductive Concerns Sexual problems and reproduction concerns may happen. In both men and women, this drug may affect your ability to have children. This cannot be determined before your treatment. Talk with your doctor or nurse if you plan to have children. Ask for information on sperm or egg banking. In men, this drug may interfere with your ability to make sperm, but it should not change your ability to have sexual relations. In women, menstrual bleeding may become irregular or stop while you are getting this drug. Do not assume that you cannot become pregnant if you do not have a menstrual period. Women may go through signs of menopause (change of  life) like vaginal dryness or itching. Vaginal lubricants can be used to lessen vaginal dryness, itching, and pain during sexual relations. Genetic counseling is available for you to talk about the effects of this drug therapy on future pregnancies. Also, a genetic counselor can look at the possible risk of problems in the unborn baby due to this medicine if an exposure happens during pregnancy. . Pregnancy warning: This drug may have harmful effects on the unborn child, so effective methods of birth control should be used during your cancer treatment. . Breast feeding warning: It is not known if this drug passes into breast milk. For this reason, women should talk to their doctor about the risks and benefits of breast feeding during treatment with this drug because this drug may enter the breast milk and badly harm a breast feeding baby.   Paclitaxel (Taxol)  Paclitaxel is a drug used to treat  cancer. It is given in the vein (IV). This drug will take longer to infuse the first time because it is increased slowly.  The second and subsequent infusions will take 1 hour.  Possible Side Effects . Hair loss. Hair loss is often temporary, although with certain medicine, hair loss can sometimes be permanent. Hair loss may happen suddenly or gradually. If you lose hair, you may lose it from your head, face, armpits, pubic area, chest, and/or legs. You may also notice your hair getting thin. . Swelling of your legs, ankles and/or feet (edema) . Flushing . Nausea and throwing up (vomiting) . Loose bowel movements (diarrhea) . Bone marrow depression. This is a decrease in the number of white blood cells, red blood cells, and platelets. This may raise your risk of infection, make you tired and weak (fatigue), and raise your risk of bleeding. . Effects on the nerves are called peripheral neuropathy. You may feel numbness, tingling, or pain in your hands and feet. It may be hard for you to button your clothes, open jars, or walk as usual. The effect on the nerves may get worse with more doses of the drug. These effects get better in some people after the drug is stopped but it does not get better in all people. . Changes in your liver function . Bone, joint and muscle pain . Abnormal EKG . Allergic reaction: Allergic reactions, including anaphylaxis are rare but may happen in some patients. Signs of allergic reaction to this drug may be swelling of the face, feeling like your tongue or throat are swelling, trouble breathing, rash, itching, fever, chills, feeling dizzy, and/or feeling that your heart is beating in a fast or not normal way. If this happens, do not take another dose of this drug. You should get urgent medical treatment. . Infection . Changes in your kidney function. Note: Each of the side effects above was reported in 20% or greater of patients treated with paclitaxel. Not all possible  side effects are included above. Warnings and Precautions . Severe bone marrow depression  Side Effects . To help with hair loss, wash with a mild shampoo and avoid washing your hair every day. . Avoid rubbing your scalp, instead, pat your hair or scalp dry . Avoid coloring your hair . Limit your use of hair spray, electric curlers, blow dryers, and curling irons. . If you are interested in getting a wig, talk to your nurse. You can also call the [facility] at 800-ACS-2345 to find out information about the "Look Good, Feel Better" program close to where you  live. It is a free program where women getting chemotherapy can learn about wigs, turbans and scarves as well as makeup techniques and skin and nail care. . Ask your doctor or nurse about medicines that are available to help stop or lessen diarrhea and/or nausea. . To help with nausea and vomiting, eat small, frequent meals instead of three large meals a day. Choose foods and drinks that are at room temperature. Ask your nurse or doctor about other helpful tips and medicine that is available to help or stop lessen these symptoms. . If you get diarrhea, eat low-fiber foods that are high in protein and calories and avoid foods that can irritate your digestive tracts or lead to cramping. Ask your nurse or doctor about medicine that can lessen or stop your diarrhea. . Mouth care is very important. Your mouth care should consist of routine, gentle cleaning of your teeth or dentures and rinsing your mouth with a mixture of 1/2 teaspoon of salt in 8 ounces of water or  teaspoon of baking soda in 8 ounces of water. This should be done at least after each meal and at bedtime. . If you have mouth sores, avoid mouthwash that has alcohol. Also avoid alcohol and smoking because they can bother your mouth and throat. . Drink plenty of fluids (a minimum of eight glasses per day is recommended). . Take your temperature as your doctor or nurse tells  you, and whenever you feel like you may have a fever. . Talk to your doctor or nurse about precautions you can take to avoid infections and bleeding. . Be careful when cooking, walking, and handling sharp objects and hot liquids.  Food and Drug Interactions . There are no known interactions of paclitaxel with food. . This drug may interact with other medicines. Tell your doctor and pharmacist about all the medicines and dietary supplements (vitamins, minerals, herbs and others) that you are taking at this time. . The safety and use of dietary supplements and alternative diets are often not known. Using these might affect your cancer or interfere with your treatment. Until more is known, you should not use dietary supplements or alternative diets without your cancer doctor's help.  When to Call the Doctor Call your doctor or nurse if you have any of the following symptoms and/or any new or unusual symptoms: . Fever of 100.5 F (38 C) or above . Chills . Redness, pain, warmth, or swelling at the IV site during the infusion . Signs of allergic reaction: swelling of the face, feeling like your tongue or throat are swelling, trouble breathing, rash, itching, fever, chills, feeling dizzy, and/or feeling that your heart is beating in a fast or not normal way . Feeling that your heart is beating in a fast or not normal way (palpitations) . Weight gain of 5 pounds in one week (fluid retention) . Decreased urine or very dark urine . Signs of liver problems: dark urine, pale bowel movements, bad stomach pain, feeling very tired and weak, unusual itching, or yellowing of the eyes or skin . Heavy menstrual period that lasts longer than normal . Easy bruising or bleeding . Nausea that stops you from eating or drinking, and/or that is not relieved by prescribed medicines. . Loose bowel movements (diarrhea) more than 4 times a day or diarrhea with weakness or lightheadedness . Pain in your mouth or throat that  makes it hard to eat or drink . Lasting loss of appetite or rapid weight loss of five  pounds in a week . Signs of peripheral neuropathy: numbness, tingling, or decreased feeling in fingers or toes; trouble walking or changes in the way you walk; or feeling clumsy when buttoning clothes, opening jars, or other routine activities . Joint and muscle pain that is not relieved by prescribed medicines . Extreme fatigue that interferes with normal activities . While you are getting this drug, please tell your nurse right away if you have any pain, redness, or swelling at the site of the IV infusion. . If you think you are pregnant.  Reproduction Warnings . Pregnancy warning: This drug may have harmful effects on the unborn child, it is recommended that effective methods of birth control should be used during your cancer treatment. Let your doctor know right away if you think you may be pregnant. . Breast feeding warning: Women should not breast feed during treatment because this drug could enter the breast milk and cause harm to a breast feeding baby.    EDUCATIONAL MATERIALS GIVEN AND REVIEWED: Chemotherapy and you book, nutrition book, information on carboplatin and taxol.   SELF CARE ACTIVITIES WHILE ON CHEMOTHERAPY: Hydration Increase your fluid intake 48 hours prior to treatment and drink at least 8 to 12 cups (64 ounces) of water/decaff beverages per day after treatment. You can still have your cup of coffee or soda but these beverages do not count as part of your 8 to 12 cups that you need to drink daily. No alcohol intake.  Medications Continue taking your normal prescription medication as prescribed.  If you start any new herbal or new supplements please let us know first to make sure it is safe.  Mouth Care Have teeth cleaned professionally before starting treatment. Keep dentures and partial plates clean. Use soft toothbrush and do not use mouthwashes that contain alcohol. Biotene is a  good mouthwash that is available at most pharmacies or may be ordered by calling [phone]. Use warm salt water gargles (1 teaspoon salt per 1 quart warm water) before and after meals and at bedtime. Or you may rinse with 2 tablespoons of three-percent hydrogen peroxide mixed in eight ounces of water. If you are still having problems with your mouth or sores in your mouth please call the clinic. If you need dental work, please let Dr. Whitney Muse know before you go for your appointment so that we can coordinate the best possible time for you in regards to your chemo regimen. You need to also let your dentist know that you are actively taking chemo. We may need to do labs prior to your dental appointment.   Skin Care Always use sunscreen that has not expired and with SPF (Sun Protection Factor) of 50 or higher. Wear hats to protect your head from the sun. Remember to use sunscreen on your hands, ears, face, & feet.  Use good moisturizing lotions such as udder cream, eucerin, or even Vaseline. Some chemotherapies can cause dry skin, color changes in your skin and nails.    . Avoid long, hot showers or baths. . Use gentle, fragrance-free soaps and laundry detergent. . Use moisturizers, preferably creams or ointments rather than lotions because the thicker consistency is better at preventing skin dehydration. Apply the cream or ointment within 15 minutes of showering. Reapply moisturizer at night, and moisturize your hands every time after you wash them.  Hair Loss (if your doctor says your hair will fall out)  . If your doctor says that your hair is likely to fall out, decide  before you begin chemo whether you want to wear a wig. You may want to shop before treatment to match your hair color. . Hats, turbans, and scarves can also camouflage hair loss, although some people prefer to leave their heads uncovered. If you go bare-headed outdoors, be sure to use sunscreen on your scalp. . Cut your hair short.  It eases the inconvenience of shedding lots of hair, but it also can reduce the emotional impact of watching your hair fall out. . Don't perm or color your hair during chemotherapy. Those chemical treatments are already damaging to hair and can enhance hair loss. Once your chemo treatments are done and your hair has grown back, it's OK to resume dyeing or perming hair. With chemotherapy, hair loss is almost always temporary. But when it grows back, it may be a different color or texture. In older adults who still had hair color before chemotherapy, the new growth may be completely gray.  Often, new hair is very fine and soft.  Infection Prevention Please wash your hands for at least 30 seconds using warm soapy water. Handwashing is the #1 way to prevent the spread of germs. Stay away from sick people or people who are getting over a cold. If you develop respiratory systems such as green/yellow mucus production or productive cough or persistent cough let us know and we will see if you need an antibiotic. It is a good idea to keep a pair of gloves on when going into grocery stores/[employer] to decrease your risk of coming into contact with germs on the carts, etc. Carry alcohol hand gel with you at all times and use it frequently if out in public. If your temperature reaches 100.5 or higher please call the clinic and let us know.  If it is after hours or on the weekend please go to the ER if your temperature is over 100.5.  Please have your own personal thermometer at home to use.    Sex and bodily fluids If you are going to have sex, a condom must be used to protect the person that isn't taking chemotherapy. Chemo can decrease your libido (sex drive). For a few days after chemotherapy, chemotherapy can be excreted through your bodily fluids.  When using the toilet please close the lid and flush the toilet twice.  Do this for a few day after you have had chemotherapy.     Effects of chemotherapy on your sex  life Some changes are simple and won't last long. They won't affect your sex life permanently. Sometimes you may feel: . too tired . not strong enough to be very active . sick or sore  . not in the mood . anxious or low Your anxiety might not seem related to sex. For example, you may be worried about the cancer and how your treatment is going. Or you may be worried about money, or about how you family are coping with your illness. These things can cause stress, which can affect your interest in sex. It's important to talk to your partner about how you feel. Remember - the changes to your sex life don't usually last long. There's usually no medical reason to stop having sex during chemo. The drugs won't have any long term physical effects on your performance or enjoyment of sex. Cancer can't be passed on to your partner during sex  Contraception It's important to use reliable contraception during treatment. Avoid getting pregnant while you or your partner are having chemotherapy. This  is because the drugs may harm the baby. Sometimes chemotherapy drugs can leave a man or woman infertile.  This means you would not be able to have children in the future. You might want to talk to someone about permanent infertility. It can be very difficult to learn that you may no longer be able to have children. Some people find counselling helpful. There might be ways to preserve your fertility, although this is easier for men than for women. You may want to speak to a fertility expert. You can talk about sperm banking or harvesting your eggs. You can also ask about other fertility options, such as donor eggs. If you have or have had breast cancer, your doctor might advise you not to take the contraceptive pill. This is because the hormones in it might affect the cancer.  It is not known for sure whether or not chemotherapy drugs can be passed on through semen or secretions from the vagina. Because of this some  doctors advise people to use a barrier method if you have sex during treatment. This applies to vaginal, anal or oral sex. Generally, doctors advise a barrier method only for the time you are actually having the treatment and for about a week after your treatment. Advice like this can be worrying, but this does not mean that you have to avoid being intimate with your partner. You can still have close contact with your partner and continue to enjoy sex.  Animals If you have cats or birds we just ask that you not change the litter or change the cage.  Please have someone else do this for you while you are on chemotherapy.   Food Safety During and After Cancer Treatment Food safety is important for people both during and after cancer treatment. Cancer and cancer treatments, such as chemotherapy, radiation therapy, and stem cell/bone marrow transplantation, often weaken the immune system. This makes it harder for your body to protect itself from foodborne illness, also called food poisoning. Foodborne illness is caused by eating food that contains harmful bacteria, parasites, or viruses.  Foods to avoid Some foods have a higher risk of becoming tainted with bacteria. These include: Marland Kitchen Unwashed fresh fruit and vegetables, especially leafy vegetables that can hide dirt and other contaminants . Raw sprouts, such as alfalfa sprouts . Raw or undercooked beef, especially ground beef, or other raw or undercooked meat and poultry . Fatty, fried, or spicy foods immediately before or after treatment.  These can sit heavy on your stomach and make you feel nauseous. . Raw or undercooked shellfish, such as oysters. . Sushi and sashimi, which often contain raw fish.  . Unpasteurized beverages, such as unpasteurized fruit juices, raw milk, raw yogurt, or cider . Undercooked eggs, such as soft boiled, over easy, and poached; raw, unpasteurized eggs; or foods made with raw egg, such as homemade raw cookie dough and  homemade mayonnaise Simple steps for food safety Shop smart. . Do not buy food stored or displayed in an unclean area. . Do not buy bruised or damaged fruits or vegetables. . Do not buy cans that have cracks, dents, or bulges. . Pick up foods that can spoil at the end of your shopping trip and store them in a cooler on the way home. Prepare and clean up foods carefully. . Rinse all fresh fruits and vegetables under running water, and dry them with a clean towel or paper towel. . Clean the top of cans before opening them. . After  preparing food, wash your hands for 20 seconds with hot water and soap. Pay special attention to areas between fingers and under nails. . Clean your utensils and dishes with hot water and soap. [place] Kitchen Disinfect your kitchen and cutting boards using 1 teaspoon of liquid, unscented bleach mixed into 1 quart of water.   Dispose of old food. . Eat canned and packaged food before its expiration date (the "use by" or "best before" date). . Consume refrigerated leftovers within 3 to 4 days. After that time, throw out the food. Even if the food does not smell or look spoiled, it still may be unsafe. Some bacteria, such as Listeria, can grow even on foods stored in the refrigerator if they are kept for too long. Take precautions when eating out. . At restaurants, avoid buffets and salad bars where food sits out for a long time and comes in contact with many people. Food can become contaminated when someone with a virus, often a norovirus, or another "bug" handles it. . Put any leftover food in a "to-go" container yourself, rather than having the server do it. And, refrigerate leftovers as soon as you get home. . Choose restaurants that are clean and that are willing to prepare your food as you order it cooked.    MEDICATIONS:                                                                                                                                                               Zofran/Ondansetron '8mg'$  tablet. Take 1 tablet every 8 hours as needed for nausea/vomiting. (#1 nausea med to take, this can constipate)  Compazine/Prochlorperazine '10mg'$  tablet. Take 1 tablet every 6 hours as needed for nausea/vomiting. (#2 nausea med to take, this can make you sleepy)   EMLA cream. Apply a quarter size amount to port site 1 hour prior to chemo. Do not rub in. Cover with plastic wrap.   Over-the-Counter Meds:  Miralax 1 capful in 8 oz of fluid daily. May increase to two times a day if needed. This is a stool softener. If this doesn't work proceed you can add:  Senokot S-start with 1 tablet two times a day and increase to 4 tablets two times a day if needed. (total of 8 tablets in a 24 hour period). This is a stimulant laxative.   Call us if this does not help your bowels move.   Imodium '2mg'$  capsule. Take 2 capsules after the 1st loose stool and then 1 capsule every 2 hours until you go a total of 12 hours without having a loose stool. Call the [facility] if loose stools continue. If diarrhea occurs @ bedtime, take 2 capsules @ bedtime. Then take 2 capsules every 4 hours until morning. Call [facility].  Diarrhea Sheet  If you are having loose stools/diarrhea, please purchase Imodium and begin taking as outlined:  At the first sign of poorly formed or loose stools you should begin taking Imodium(loperamide) 2 mg capsules.  Take two caplets ('4mg'$ ) followed by one caplet ('2mg'$ ) every 2 hours until you have had no diarrhea for 12 hours.  During the night take two caplets ('4mg'$ ) at bedtime and continue every 4 hours during the night until the morning.  Stop taking Imodium only after there is no sign of diarrhea for 12 hours.    Always call the [facility] if you are having loose stools/diarrhea that you can't get under control.  Loose stools/disrrhea leads to dehydration (loss of water) in your body.  We have other options of trying to get the loose stools/diarrhea to stopped  but you must let us know!     Constipation Sheet *Miralax in 8 oz of fluid daily.  May increase to two times a day if needed.  This is a stool softener.  If this not enough to keep your bowel regular:  You can add:  *Senokot S, start with one tablet twice a day and can increase to 4 tablets twice a day if needed.  This is a stimulant laxative.   Sometimes when you take pain medication you need BOTH a medicine to keep your stool soft and a medicine to help your bowel push it out!  Please call if the above does not work for you.   Do not go more than 2 days without a bowel movement.  It is very important that you do not become constipated.  It will make you feel sick to your stomach (nausea) and can cause abdominal pain and vomiting.      Nausea Sheet  Zofran/Ondansetron '8mg'$  tablet. Take 1 tablet every 8 hours as needed for nausea/vomiting. (#1 nausea med to take, this can constipate)  Compazine/Prochlorperazine '10mg'$  tablet. Take 1 tablet every 6 hours as needed for nausea/vomiting. (#2 nausea med to take, this can make you sleepy)  You can take these medications together or separately.  We would first like for you to try the Ondansetron by itself and then take the Prochloperizine if needed. But you are allowed to take both medications at the same time if your nausea is that severe.  If you are having persistent nausea (nausea that does not stop) please take these medications on a staggered schedule so that the nausea medication stays in your body.  Please call the [facility] and let us know the amount of nausea that you are experiencing.  If you begin to vomit, you need to call the [facility] and if it is the weekend and you have vomited more than one time and cant get it to stop-go to the Emergency Room.  Persistent nausea/vomiting can lead to dehydration (loss of fluid in your body) and will make you feel terrible.   Ice chips, sips of clear liquids, foods that are @ room  temperature, crackers, and toast tend to be better tolerated.     SYMPTOMS TO REPORT AS SOON AS POSSIBLE AFTER TREATMENT:  FEVER GREATER THAN 100.5 F  CHILLS WITH OR WITHOUT FEVER  NAUSEA AND VOMITING THAT IS NOT CONTROLLED WITH YOUR NAUSEA MEDICATION  UNUSUAL SHORTNESS OF BREATH  UNUSUAL BRUISING OR BLEEDING  TENDERNESS IN MOUTH AND THROAT WITH OR WITHOUT PRESENCE OF ULCERS  URINARY PROBLEMS  BOWEL PROBLEMS  UNUSUAL RASH    Wear comfortable clothing and clothing  appropriate for easy access to any Portacath or PICC line. Let us know if there is anything that we can do to make your therapy better!     What to do if you need assistance after hours or on the weekends: CALL [phone].  HOLD on the line, do not hang up.  You will hear multiple messages but at the end you will be connected with a nurse triage line.  They will contact Dr Whitney Muse if necessary.  Most of the time they will be able to assist you.   Do not call the hospital operator.  Dr Whitney Muse will not answer phone calls received by them.     I have been informed and understand all of the instructions given to me and have received a copy. I have been instructed to call the clinic [phone] or my family physician as soon as possible for continued medical care, if indicated. I do not have any more questions at this time but understand that I may call the [facility] or the Patient Navigator at [phone] during office hours should I have questions or need assistance in obtaining follow-up care.

## 2016-11-29 ENCOUNTER — Encounter (HOSPITAL_COMMUNITY): Payer: Medicaid Other | Attending: Hematology & Oncology

## 2016-11-29 ENCOUNTER — Ambulatory Visit (HOSPITAL_COMMUNITY): Payer: Self-pay | Admitting: Hematology & Oncology

## 2016-11-29 ENCOUNTER — Encounter (HOSPITAL_COMMUNITY): Payer: Self-pay

## 2016-11-29 DIAGNOSIS — Z79899 Other long term (current) drug therapy: Secondary | ICD-10-CM | POA: Insufficient documentation

## 2016-11-29 DIAGNOSIS — R64 Cachexia: Secondary | ICD-10-CM | POA: Insufficient documentation

## 2016-11-29 DIAGNOSIS — R490 Dysphonia: Secondary | ICD-10-CM | POA: Insufficient documentation

## 2016-11-29 DIAGNOSIS — J42 Unspecified chronic bronchitis: Secondary | ICD-10-CM | POA: Insufficient documentation

## 2016-11-29 DIAGNOSIS — Z9889 Other specified postprocedural states: Secondary | ICD-10-CM | POA: Insufficient documentation

## 2016-11-29 DIAGNOSIS — N529 Male erectile dysfunction, unspecified: Secondary | ICD-10-CM | POA: Insufficient documentation

## 2016-11-29 DIAGNOSIS — Z8249 Family history of ischemic heart disease and other diseases of the circulatory system: Secondary | ICD-10-CM | POA: Insufficient documentation

## 2016-11-29 DIAGNOSIS — Z833 Family history of diabetes mellitus: Secondary | ICD-10-CM | POA: Insufficient documentation

## 2016-11-29 DIAGNOSIS — Z8719 Personal history of other diseases of the digestive system: Secondary | ICD-10-CM | POA: Insufficient documentation

## 2016-11-29 DIAGNOSIS — Z809 Family history of malignant neoplasm, unspecified: Secondary | ICD-10-CM | POA: Insufficient documentation

## 2016-11-29 DIAGNOSIS — G893 Neoplasm related pain (acute) (chronic): Secondary | ICD-10-CM | POA: Insufficient documentation

## 2016-11-29 DIAGNOSIS — I10 Essential (primary) hypertension: Secondary | ICD-10-CM | POA: Insufficient documentation

## 2016-11-29 DIAGNOSIS — Z720 Tobacco use: Secondary | ICD-10-CM | POA: Insufficient documentation

## 2016-11-29 DIAGNOSIS — C787 Secondary malignant neoplasm of liver and intrahepatic bile duct: Secondary | ICD-10-CM | POA: Insufficient documentation

## 2016-11-29 DIAGNOSIS — Z823 Family history of stroke: Secondary | ICD-10-CM | POA: Insufficient documentation

## 2016-11-29 MED ORDER — LIDOCAINE-PRILOCAINE 2.5-2.5 % EX CREA: 1.0000 "application " | TOPICAL_CREAM | CUTANEOUS | 2 refills | Status: AC | PRN

## 2016-11-30 ENCOUNTER — Other Ambulatory Visit (HOSPITAL_COMMUNITY): Payer: Self-pay | Admitting: Emergency Medicine

## 2016-11-30 ENCOUNTER — Other Ambulatory Visit (HOSPITAL_COMMUNITY): Payer: Self-pay | Admitting: Hematology & Oncology

## 2016-11-30 MED ORDER — AZITHROMYCIN 250 MG PO TABS: ORAL_TABLET | ORAL | 0 refills | Status: DC

## 2016-11-30 NOTE — Progress Notes (Signed)
z-pack called into the pharmacy.  Pt complains of coughing up blood and a bad odor coming from his mouth.  Pt notified.

## 2016-11-30 NOTE — Progress Notes (Signed)
ALERT: Recent Path[facility] Treatment decision is outdated. Please await next Pathways decision 

## 2016-12-01 ENCOUNTER — Ambulatory Visit (HOSPITAL_COMMUNITY): Payer: Self-pay

## 2016-12-04 ENCOUNTER — Telehealth (HOSPITAL_COMMUNITY): Payer: Self-pay | Admitting: Emergency Medicine

## 2016-12-04 NOTE — Telephone Encounter (Signed)
Called Tammy to see if I had sent MSI testing out for this pt.  I sent it last week.  Should be back within a few days.

## 2016-12-05 ENCOUNTER — Telehealth (HOSPITAL_COMMUNITY): Payer: Self-pay | Admitting: Emergency Medicine

## 2016-12-05 ENCOUNTER — Encounter: Payer: Self-pay | Admitting: Family Medicine

## 2016-12-05 ENCOUNTER — Other Ambulatory Visit (HOSPITAL_COMMUNITY): Payer: Self-pay | Admitting: *Deleted

## 2016-12-05 ENCOUNTER — Ambulatory Visit (INDEPENDENT_AMBULATORY_CARE_PROVIDER_SITE_OTHER): Payer: Medicaid Other | Admitting: Family Medicine

## 2016-12-05 VITALS — BP 124/74 | HR 108 | Temp 98.3°F | Resp 20 | Ht 74.0 in | Wt 172.1 lb

## 2016-12-05 DIAGNOSIS — F1010 Alcohol abuse, uncomplicated: Secondary | ICD-10-CM | POA: Diagnosis not present

## 2016-12-05 DIAGNOSIS — I10 Essential (primary) hypertension: Secondary | ICD-10-CM

## 2016-12-05 DIAGNOSIS — E119 Type 2 diabetes mellitus without complications: Secondary | ICD-10-CM

## 2016-12-05 DIAGNOSIS — C155 Malignant neoplasm of lower third of esophagus: Secondary | ICD-10-CM | POA: Diagnosis not present

## 2016-12-05 NOTE — Progress Notes (Signed)
Chief Complaint  Patient presents with  . Follow-up   Here for follow up His cancer treatment is failing and he has progression of disease He asks why it didn't work, and I told him he had done everything he could, and that medicine is just not 100%. He is still drinking alcohol, and thought this is why his disease had progressed.  I told him the alcohol is definitely NOT a good idea, but he didn't cause the cancer treatment to fail.   He has gained weight - but I fear it is fluid in ascites and his ankles and not nutrition.   Pain is managed by [facility] He still has no insurance and says it is being "worked on" No polyuria or polydipsia from diabetes.  Will check an A1c.  Diabetes loosely managed due to performance status    Patient Active Problem List   Diagnosis Date Noted  . Portal vein thrombosis 11/23/2016  . Thrush of mouth and esophagus ([facility]) 08/11/2016  . Bacterial intestinal infection 07/13/2016  . Liver metastases ([facility]) 05/29/2016  . 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 Alcohol abuse 08/02/2015    Outpatient Encounter Prescriptions as of 12/05/2016  Medication Sig  . CARBOPLATIN IV Inject into the vein. Day 1, day 8, day 15 every 28 days  . furosemide (LASIX) 20 MG tablet Take 1 tablet (20 mg total) by mouth daily.  Marland Kitchen lidocaine-prilocaine (EMLA) cream Apply 1 application topically as needed. Apply quarter size amount to affected area 1 hour prior to chemotherapy  . loperamide (IMODIUM A-D) 2 MG tablet Take 1 tablet (2 mg total) by mouth 4 (four) times daily as needed for diarrhea or loose stools.  . megestrol (MEGACE) 400 MG/10ML suspension Take 10 mLs (400 mg total) by mouth 2 (two) times daily.  . metFORMIN (GLUCOPHAGE) 500 MG tablet Take 1 tablet (500 mg total) by mouth 2 (two) times daily with a meal.  . morphine (MSIR) 15 MG tablet Take 1 tablet (15 mg total) by mouth every 6 (six) hours as needed for severe pain.  Marland Kitchen ondansetron (ZOFRAN-ODT) 8 MG disintegrating  tablet Take 8 mg by mouth every 8 (eight) hours as needed for nausea or vomiting.  Marland Kitchen PACLitaxel (TAXOL IV) Inject into the vein. Day 1, day 8, day 15, every 28 days  . potassium chloride SA (K-DUR,KLOR-CON) 20 MEQ tablet Take 3 tablets (60 mEq total) by mouth daily.  . prochlorperazine (COMPAZINE) 10 MG tablet Take 10 mg by mouth every 6 (six) hours as needed for nausea or vomiting.  . Rivaroxaban (XARELTO) 15 MG TABS tablet Take 1 tablet (15 mg total) by mouth 2 (two) times daily with a meal.  . [START ON 12/15/2016] rivaroxaban (XARELTO) 20 MG TABS tablet Take 1 tablet (20 mg total) by mouth daily with supper.   No facility-administered encounter medications on file as of 12/05/2016.     No Known Allergies  Review of Systems  Constitutional: Positive for fatigue and unexpected weight change. Negative for activity change, appetite change, chills and fever.  HENT: Negative for congestion and rhinorrhea.   Eyes: Negative for photophobia and visual disturbance.  Respiratory: Positive for shortness of breath. Negative for cough.        With exertion  Cardiovascular: Positive for leg swelling. Negative for chest pain and palpitations.  Gastrointestinal: Positive for abdominal distention, abdominal pain and nausea. Negative for blood in stool, constipation and diarrhea.  Endocrine: Negative for polydipsia and polyuria.  Genitourinary: Negative for difficulty urinating  and frequency.  Musculoskeletal: Positive for back pain.  Neurological: Negative for dizziness and headaches.  Hematological: Bruises/bleeds easily.  Psychiatric/Behavioral: Negative for dysphoric mood and sleep disturbance. The patient is not nervous/anxious.     BP 124/74 (BP Location: Right Arm, Patient Position: Sitting, Cuff Size: Normal)   Pulse (!) 108   Temp 98.3 F (36.8 C) (Oral)   Resp 20   Ht '6\' 2"'$  (1.88 m)   Wt 172 lb 1.9 oz (78.1 kg)   SpO2 98%   BMI 22.10 kg/m   Physical Exam  Constitutional: He is  oriented to person, place, and time. He appears well-developed.  Cachectic, thin  HENT:  Head: Normocephalic and atraumatic.  Right Ear: External ear normal.  Left Ear: External ear normal.  Mouth/Throat: Oropharynx is clear and moist.  Poor dental repair.   Eyes: Conjunctivae are normal. Pupils are equal, round, and reactive to light.  Neck: Normal range of motion. No thyromegaly present.  Cardiovascular: Regular rhythm and normal heart sounds.   No murmur heard. Tachycardia  Pulmonary/Chest: Effort normal and breath sounds normal. No respiratory distress. He has no wheezes.  Poor inspiration.  Abdominal: Soft. Bowel sounds are normal. He exhibits distension. There is tenderness.  Distended. umbilical hernia. Diffusely tender. Hepatomegaly.  Musculoskeletal: Normal range of motion. He exhibits edema.  1+ pitting edema  Lymphadenopathy:    He has no cervical adenopathy.  Neurological: He is alert and oriented to person, place, and time. He has normal reflexes.  Psychiatric: He has a normal mood and affect. His behavior is normal. Judgment and thought content normal.    ASSESSMENT/PLAN:  1. 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 Alcohol abuse continues  3. Diabetes mellitus Adequate control - Hemoglobin A1c  4. Essential hypertension controlled   Patient Instructions  Continue to try to eat well Use the furosemide ( lasix) and elevate feet when legs are swollen I will call to see about help with medical insurance  I have ordered blood work to be done at the cancer center with your next lab draw  See me in 2-3 months  Call sooner for problems     Raylene Everts, MD

## 2016-12-05 NOTE — Patient Instructions (Signed)
Continue to try to eat well Use the furosemide ( lasix) and elevate feet when legs are swollen I will call to see about help with medical insurance  I have ordered blood work to be done at the cancer center with your next lab draw  See me in 2-3 months  Call sooner for problems

## 2016-12-05 NOTE — Telephone Encounter (Signed)
Spoke with Joshua Greene at pathology about MSI, she is going to see if results are just waiting to be resulted.  Will call back to see if I need to move the pts chemotherapy appt.

## 2016-12-06 ENCOUNTER — Ambulatory Visit (HOSPITAL_COMMUNITY): Payer: Self-pay

## 2016-12-06 ENCOUNTER — Ambulatory Visit (HOSPITAL_COMMUNITY): Payer: Self-pay | Admitting: Hematology & Oncology

## 2016-12-06 ENCOUNTER — Telehealth (HOSPITAL_COMMUNITY): Payer: Self-pay | Admitting: Emergency Medicine

## 2016-12-06 ENCOUNTER — Other Ambulatory Visit (HOSPITAL_COMMUNITY): Payer: Self-pay | Admitting: Hematology & Oncology

## 2016-12-06 NOTE — Progress Notes (Addendum)
ALERT: Recent Pathways Treatment decision is outdated. Please await next Pathways decision 

## 2016-12-06 NOTE — Progress Notes (Signed)
START OFF PATHWAY REGIMEN - Gastroesophageal  Off Pathway: Carboplatin + Paclitaxel (2/80) weekly  [id number]:Carboplatin + Paclitaxel (2/80) weekly:   Administer weekly:     Paclitaxel (Taxol(R)) 80 mg/m2 in 250 mL NS IV over 1 hour followed by Dose Mod: None     Carboplatin (Paraplatin(R)) AUC=2 in 100 mL D5W IV over 30 minutes Dose Mod: None Additional Orders: * All AUC calculations intended to be used in [facility] formula Cycle repeated q7weeks. Original study treated until disease progression or intolerable toxicity (max of 18 doses)  **Always confirm dose/schedule in your pharmacy ordering system**    Patient Characteristics: Esophageal, Squamous Cell, Stage IV, Metastatic / Locally Recurrent Disease, Second Line and Beyond, MSS / pMMR Histology: Squamous Cell Disease Classification: Esophageal AJCC M Stage: X AJCC N Stage: X AJCC Stage Grouping: IV Current Disease Status: Distant Metastases Tumor Grade: X AJCC T Stage: X Tumor Location: X Line of therapy: Second Line and Beyond Would you be surprised if this patient died  in the next year? I would NOT be surprised if this patient died in the next year Microsatellite/Mismatch Repair Status: MSS/pMMR  Intent of Therapy: Non-Curative / Palliative Intent, Discussed with Patient

## 2016-12-06 NOTE — Telephone Encounter (Signed)
Late entry 1545- called  Kim back at pathology.  She states that the pathologist was suppose to be working on it that's why she had not called me back.  She went to check but the pathologist was in a conference call.  She stated that she would call me back.  Kim called back about 415 pm and stated that he was entering the MSI result then and to give it about 20 minutes to cross over in EPIC.  I waited til 510 pm and there were still no results entered.  I had to call the pt and move his chemotherapy appt.  His fiance was very upset about this.  I tried to explain we were waiting on pathology results.

## 2016-12-06 NOTE — Telephone Encounter (Signed)
Called kim in pathology to check on the MSI results and she states that they were there, but I can not see them on our side.  There are only 2 pages to the addendum.  It is suppose to be 3 pages.  She is going to fax the results to [phone].

## 2016-12-07 ENCOUNTER — Encounter (HOSPITAL_COMMUNITY): Payer: Self-pay

## 2016-12-07 ENCOUNTER — Ambulatory Visit (HOSPITAL_COMMUNITY): Payer: Self-pay

## 2016-12-07 ENCOUNTER — Inpatient Hospital Stay (HOSPITAL_COMMUNITY)
Admission: EM | Admit: 2016-12-07 | Discharge: 2016-12-11 | DRG: 441 | Disposition: A | Discharge status: Hospice | Payer: Medicaid Other | Attending: Internal Medicine | Admitting: Internal Medicine

## 2016-12-07 DIAGNOSIS — K766 Portal hypertension: Secondary | ICD-10-CM | POA: Diagnosis present

## 2016-12-07 DIAGNOSIS — C787 Secondary malignant neoplasm of liver and intrahepatic bile duct: Secondary | ICD-10-CM | POA: Diagnosis present

## 2016-12-07 DIAGNOSIS — I8511 Secondary esophageal varices with bleeding: Secondary | ICD-10-CM | POA: Diagnosis present

## 2016-12-07 DIAGNOSIS — M6250 Muscle wasting and atrophy, not elsewhere classified, unspecified site: Secondary | ICD-10-CM | POA: Diagnosis present

## 2016-12-07 DIAGNOSIS — Z66 Do not resuscitate: Secondary | ICD-10-CM | POA: Diagnosis present

## 2016-12-07 DIAGNOSIS — E119 Type 2 diabetes mellitus without complications: Secondary | ICD-10-CM | POA: Diagnosis present

## 2016-12-07 DIAGNOSIS — Z8249 Family history of ischemic heart disease and other diseases of the circulatory system: Secondary | ICD-10-CM

## 2016-12-07 DIAGNOSIS — C7800 Secondary malignant neoplasm of unspecified lung: Secondary | ICD-10-CM | POA: Diagnosis present

## 2016-12-07 DIAGNOSIS — K920 Hematemesis: Secondary | ICD-10-CM | POA: Diagnosis not present

## 2016-12-07 DIAGNOSIS — K7031 Alcoholic cirrhosis of liver with ascites: Secondary | ICD-10-CM | POA: Diagnosis present

## 2016-12-07 DIAGNOSIS — I81 Portal vein thrombosis: Secondary | ICD-10-CM

## 2016-12-07 DIAGNOSIS — Z833 Family history of diabetes mellitus: Secondary | ICD-10-CM

## 2016-12-07 DIAGNOSIS — F17220 Nicotine dependence, chewing tobacco, uncomplicated: Secondary | ICD-10-CM | POA: Diagnosis present

## 2016-12-07 DIAGNOSIS — I10 Essential (primary) hypertension: Secondary | ICD-10-CM | POA: Diagnosis present

## 2016-12-07 DIAGNOSIS — K831 Obstruction of bile duct: Secondary | ICD-10-CM | POA: Diagnosis present

## 2016-12-07 DIAGNOSIS — K7290 Hepatic failure, unspecified without coma: Secondary | ICD-10-CM | POA: Insufficient documentation

## 2016-12-07 DIAGNOSIS — K1379 Other lesions of oral mucosa: Secondary | ICD-10-CM

## 2016-12-07 DIAGNOSIS — K922 Gastrointestinal hemorrhage, unspecified: Secondary | ICD-10-CM

## 2016-12-07 DIAGNOSIS — Z515 Encounter for palliative care: Secondary | ICD-10-CM | POA: Diagnosis present

## 2016-12-07 DIAGNOSIS — I8510 Secondary esophageal varices without bleeding: Secondary | ICD-10-CM | POA: Diagnosis not present

## 2016-12-07 DIAGNOSIS — R791 Abnormal coagulation profile: Secondary | ICD-10-CM | POA: Diagnosis present

## 2016-12-07 DIAGNOSIS — F1010 Alcohol abuse, uncomplicated: Secondary | ICD-10-CM | POA: Diagnosis not present

## 2016-12-07 DIAGNOSIS — K7469 Other cirrhosis of liver: Secondary | ICD-10-CM

## 2016-12-07 DIAGNOSIS — C153 Malignant neoplasm of upper third of esophagus: Secondary | ICD-10-CM

## 2016-12-07 DIAGNOSIS — J45909 Unspecified asthma, uncomplicated: Secondary | ICD-10-CM | POA: Diagnosis present

## 2016-12-07 DIAGNOSIS — R000 Tachycardia, unspecified: Secondary | ICD-10-CM | POA: Diagnosis present

## 2016-12-07 DIAGNOSIS — Z7901 Long term (current) use of anticoagulants: Secondary | ICD-10-CM | POA: Diagnosis not present

## 2016-12-07 DIAGNOSIS — I8500 Esophageal varices without bleeding: Secondary | ICD-10-CM | POA: Diagnosis present

## 2016-12-07 DIAGNOSIS — Z86718 Personal history of other venous thrombosis and embolism: Secondary | ICD-10-CM

## 2016-12-07 DIAGNOSIS — K5900 Constipation, unspecified: Secondary | ICD-10-CM

## 2016-12-07 DIAGNOSIS — R188 Other ascites: Secondary | ICD-10-CM | POA: Diagnosis not present

## 2016-12-07 DIAGNOSIS — K429 Umbilical hernia without obstruction or gangrene: Secondary | ICD-10-CM | POA: Diagnosis present

## 2016-12-07 DIAGNOSIS — Z7189 Other specified counseling: Secondary | ICD-10-CM | POA: Diagnosis not present

## 2016-12-07 DIAGNOSIS — C155 Malignant neoplasm of lower third of esophagus: Secondary | ICD-10-CM | POA: Diagnosis present

## 2016-12-07 LAB — COMPREHENSIVE METABOLIC PANEL
ALT: 55 U/L (ref 17–63)
ANION GAP: 10 (ref 5–15)
AST: 219 U/L — ABNORMAL HIGH (ref 15–41)
Albumin: 2.3 g/dL — ABNORMAL LOW (ref 3.5–5.0)
Alkaline Phosphatase: 195 U/L — ABNORMAL HIGH (ref 38–126)
BILIRUBIN TOTAL: 7.6 mg/dL — AB (ref 0.3–1.2)
BUN: 9 mg/dL (ref 6–20)
CO2: 25 mmol/L (ref 22–32)
Calcium: 8.3 mg/dL — ABNORMAL LOW (ref 8.9–10.3)
Chloride: 94 mmol/L — ABNORMAL LOW (ref 101–111)
Creatinine, Ser: 0.55 mg/dL — ABNORMAL LOW (ref 0.61–1.24)
GFR calc Af Amer: >60 mL/min (ref >60)
Glucose, Bld: 116 mg/dL — ABNORMAL HIGH (ref 65–99)
POTASSIUM: 3.9 mmol/L (ref 3.5–5.1)
Sodium: 129 mmol/L — ABNORMAL LOW (ref 135–145)
TOTAL PROTEIN: 7.4 g/dL (ref 6.5–8.1)

## 2016-12-07 LAB — AMMONIA: Ammonia: 87 umol/L — ABNORMAL HIGH (ref 9–35)

## 2016-12-07 LAB — CBC WITH DIFFERENTIAL/PLATELET
Basophils Absolute: 0.1 10*3/uL (ref 0.0–0.1)
Basophils Relative: 0 %
Eosinophils Absolute: 0.1 10*3/uL (ref 0.0–0.7)
Eosinophils Relative: 0 %
HEMATOCRIT: 35.3 % — AB (ref 39.0–52.0)
Hemoglobin: 11.9 g/dL — ABNORMAL LOW (ref 13.0–17.0)
LYMPHS PCT: 12 %
Lymphs Abs: 1.6 10*3/uL (ref 0.7–4.0)
MCH: 32.4 pg (ref 26.0–34.0)
MCHC: 33.7 g/dL (ref 30.0–36.0)
MCV: 96.2 fL (ref 78.0–100.0)
MONO ABS: 2.2 10*3/uL — AB (ref 0.1–1.0)
MONOS PCT: 16 %
NEUTROS ABS: 10.1 10*3/uL — AB (ref 1.7–7.7)
Neutrophils Relative %: 72 %
Platelets: 139 10*3/uL — ABNORMAL LOW (ref 150–400)
RBC: 3.67 MIL/uL — ABNORMAL LOW (ref 4.22–5.81)
RDW: 16.4 % — AB (ref 11.5–15.5)
WBC: 14.1 10*3/uL — ABNORMAL HIGH (ref 4.0–10.5)

## 2016-12-07 LAB — LACTIC ACID, PLASMA
LACTIC ACID, VENOUS: 3.4 mmol/L — AB (ref 0.5–1.9)
Lactic Acid, Venous: 2.7 mmol/L (ref 0.5–1.9)

## 2016-12-07 LAB — I-STAT CHEM 8, ED
BUN: 8 mg/dL (ref 6–20)
CREATININE: 0.5 mg/dL — AB (ref 0.61–1.24)
Calcium, Ion: 1.01 mmol/L — ABNORMAL LOW (ref 1.15–1.40)
Chloride: 95 mmol/L — ABNORMAL LOW (ref 101–111)
GLUCOSE: 114 mg/dL — AB (ref 65–99)
HCT: 35 % — ABNORMAL LOW (ref 39.0–52.0)
HEMOGLOBIN: 11.9 g/dL — AB (ref 13.0–17.0)
POTASSIUM: 4.1 mmol/L (ref 3.5–5.1)
Sodium: 133 mmol/L — ABNORMAL LOW (ref 135–145)
TCO2: 29 mmol/L (ref 0–100)

## 2016-12-07 LAB — TYPE AND SCREEN
ABO/RH(D): O POS
Antibody Screen: NEGATIVE

## 2016-12-07 LAB — HEMOGLOBIN AND HEMATOCRIT, BLOOD
HCT: 34.4 % — ABNORMAL LOW (ref 39.0–52.0)
HCT: 32.4 % — ABNORMAL LOW (ref 39.0–52.0)
Hemoglobin: 11.4 g/dL — ABNORMAL LOW (ref 13.0–17.0)
Hemoglobin: 10.9 g/dL — ABNORMAL LOW (ref 13.0–17.0)

## 2016-12-07 LAB — PROTIME-INR
INR: 5.03
Prothrombin Time: 47.9 seconds — ABNORMAL HIGH (ref 11.4–15.2)

## 2016-12-07 MED ORDER — SODIUM CHLORIDE 0.9 % IV SOLN
80.0000 mg | Freq: Once | INTRAVENOUS | Status: AC
  Administered 2016-12-07: 80 mg via INTRAVENOUS
  Filled 2016-12-07: qty 80

## 2016-12-07 MED ORDER — SODIUM CHLORIDE 0.9 % IV SOLN
50.0000 ug/h | INTRAVENOUS | Status: DC
  Administered 2016-12-07 – 2016-12-10 (×6): 50 ug/h via INTRAVENOUS
  Filled 2016-12-07 (×10): qty 1

## 2016-12-07 MED ORDER — SODIUM CHLORIDE 0.9 % IV SOLN
8.0000 mg/h | INTRAVENOUS | Status: AC
  Administered 2016-12-07 – 2016-12-10 (×6): 8 mg/h via INTRAVENOUS
  Filled 2016-12-07 (×9): qty 80

## 2016-12-07 MED ORDER — CEFTRIAXONE SODIUM 1 G IJ SOLR
1.0000 g | INTRAMUSCULAR | Status: DC
  Administered 2016-12-08 – 2016-12-11 (×4): 1 g via INTRAVENOUS
  Filled 2016-12-07 (×5): qty 10

## 2016-12-07 MED ORDER — BOOST / RESOURCE BREEZE PO LIQD
1.0000 | Freq: Three times a day (TID) | ORAL | Status: DC
  Administered 2016-12-07 – 2016-12-11 (×8): 1 via ORAL

## 2016-12-07 MED ORDER — FUROSEMIDE 20 MG PO TABS
20.0000 mg | ORAL_TABLET | Freq: Every day | ORAL | Status: DC
  Administered 2016-12-08 – 2016-12-11 (×4): 20 mg via ORAL
  Filled 2016-12-07 (×4): qty 1

## 2016-12-07 MED ORDER — SODIUM CHLORIDE 0.9 % IV BOLUS (SEPSIS)
1000.0000 mL | Freq: Once | INTRAVENOUS | Status: AC
  Administered 2016-12-07: 1000 mL via INTRAVENOUS

## 2016-12-07 MED ORDER — ONDANSETRON HCL 4 MG/2ML IJ SOLN: 4.0000 mg | Freq: Four times a day (QID) | INTRAMUSCULAR | Status: DC | PRN

## 2016-12-07 MED ORDER — PANTOPRAZOLE SODIUM 40 MG IV SOLR: 40.0000 mg | Freq: Two times a day (BID) | INTRAVENOUS | Status: DC

## 2016-12-07 MED ORDER — VITAMIN K1 10 MG/ML IJ SOLN
5.0000 mg | Freq: Once | INTRAMUSCULAR | Status: AC
  Administered 2016-12-07: 5 mg via SUBCUTANEOUS
  Filled 2016-12-07: qty 1

## 2016-12-07 MED ORDER — ONDANSETRON HCL 4 MG PO TABS: 4.0000 mg | ORAL_TABLET | Freq: Four times a day (QID) | ORAL | Status: DC | PRN

## 2016-12-07 MED ORDER — SODIUM CHLORIDE 0.9 % IV SOLN
Freq: Once | INTRAVENOUS | Status: AC
  Administered 2016-12-07: 14:00:00 via INTRAVENOUS

## 2016-12-07 MED ORDER — ALBUTEROL SULFATE (2.5 MG/3ML) 0.083% IN NEBU: 2.5000 mg | INHALATION_SOLUTION | RESPIRATORY_TRACT | Status: DC | PRN

## 2016-12-07 MED ORDER — DEXTROSE 5 % IV SOLN
1.0000 g | Freq: Once | INTRAVENOUS | Status: AC
  Administered 2016-12-07: 1 g via INTRAVENOUS
  Filled 2016-12-07: qty 10

## 2016-12-07 MED ORDER — SODIUM CHLORIDE 0.9 % IV SOLN: 80.0000 mg | Freq: Once | INTRAVENOUS | Status: DC

## 2016-12-07 MED ORDER — OCTREOTIDE LOAD VIA INFUSION
50.0000 ug | Freq: Once | INTRAVENOUS | Status: AC
  Administered 2016-12-07: 50 ug via INTRAVENOUS
  Filled 2016-12-07: qty 25

## 2016-12-07 MED ORDER — HYDROCODONE-ACETAMINOPHEN 5-325 MG PO TABS
1.0000 | ORAL_TABLET | ORAL | Status: DC | PRN
  Administered 2016-12-08 – 2016-12-11 (×9): 2 via ORAL
  Filled 2016-12-07 (×3): qty 2
  Filled 2016-12-07 (×2): qty 1
  Filled 2016-12-07 (×5): qty 2

## 2016-12-07 NOTE — Progress Notes (Deleted)
Called both numbers for patient. Not able to leave a voicemail. Patient scheduled for 0815 treatment today, has not shown up yet

## 2016-12-07 NOTE — Consult Note (Signed)
Referring Provider: No ref. provider found Primary Care Physician:  Raylene Everts, MD Primary Gastroenterologist:  Dr. Gala Romney  Date of Admission: 12/07/16 Date of Consultation: 12/07/16  Reason for Consultation:  GI bleed, cirrhosis  HPI:  Joshua Greene is a 55 y.o. 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 He presented to the emergency department with complaint of rectal bleeding the night before with a large amount of rectal blood on the floor and his home. Also noted oral bleeding and teeth were noted to be covered and a small amount of blood. Also with abdominal tenderness and distention which has been ongoing and associated vomiting twice. On exam noted scleral icterus and abdominal distention with 2+ bilateral pitting edema.  CBC noted white blood cell count 14.1, hemoglobin 11.9, platelets 139. CMP noted sodium 129, BUN normal, creatinine normal, AST/ALT 219/55, alkaline phosphatase elevated at 195, total bilirubin elevated at 7.6 compared to 3.32 weeks ago. Ammonia elevated at 87. Lactic acid elevated at 3.4/critical. INR noted to be 5.03. He is on Xarelto but this should not affect his INR.  Abdominal ultrasound dated 11/20/2016 found widespread hepatic metastases and metastatic disease, portal vein thrombosis, minimal ascites. CT of the abdomen and pelvis completed 11/22/2016 found mild progression of pulmonary metastasis, mediastinal adenopathy consistent with nodal metastasis, progression of diffuse hepatic metastasis, cirrhosis and portal vein hypertension with esophageal, gastric, perirectal varices and suspicion of left portal vein chronic thrombosis, increased abdominal pelvic ascites, nonspecific gallbladder wall thickening likely due to venous portal hypertension, similar splenic lesions possibly representing metastasis versus fungal infection, osseous metastasis.  Last saw oncology 12/05/2016 at which point it was noted his  treatment is failing and he has progression of his disease. Continues to drink alcohol. Noted weight gain query fluid and ascites and ankles.  The patient underwent upper endoscopy at request of oncology on 05/25/2016 which found erythematous mucosa in the stomach, erosive gastropathy, normal duodenum, ulcerating esophageal lesion likely representing squamous cell carcinoma and likely primary tumor. Also noted 4 columns of grade 2-3 esophageal varices in the distal two thirds of the tubular esophagus. Recommended continue current medications, await pathology results, return to GI office as needed. Surgical pathology found the biopsies to be poorly differentiated carcinoma unable to differentiate between adenocarcinoma and squamous cell carcinoma although squamous cell carcinoma is favored.  Today he confirms the above information. Had one episode of red rectal bleeding last night, none since. Had 2 episodes of emesis as well, noted blood but not sure how much. No coffee-ground emesis. Denies abdominal pain at this time. States he has not had any alcohol in 2-3 weeks, before then was drinking 2-3 drinks a day. Denies abdominal pain at this time. Denies chest pain, dyspnea, syncope, near syncope. Denies yellowing of skin/eyes. Denies melena. Admits mouth bleeding. No other upper or lower GI symptoms.  Past Medical History:  Diagnosis Date  . Alcohol abuse    been through rehab  . Allergy   . Anemia   . Asthma    childhood  . Chicken pox   . Chronic bronchitis ([place])   . Diabetes mellitus without complication ([place])   . Erectile dysfunction   . Esophageal cancer ([place]) 05/22/2016  . History of stomach ulcers   . Hypertension   . Portal vein thrombosis 11/23/2016    Past Surgical History:  Procedure Laterality Date  . APPENDECTOMY  1978  . ESOPHAGOGASTRODUODENOSCOPY N/A 05/25/2016   Procedure: ESOPHAGOGASTRODUODENOSCOPY (EGD);  Surgeon: Cristopher Estimable  Rourk, MD;  Location: [facility];  Service:  Endoscopy;  Laterality: N/A;  . HERNIA REPAIR      Prior to Admission medications   Medication Sig Start Date End Date Taking? Authorizing Provider  CARBOPLATIN IV Inject into the vein. Day 1, day 8, day 15 every 28 days   Yes Historical Provider, MD  lidocaine-prilocaine (EMLA) cream Apply 1 application topically as needed. Apply quarter size amount to affected area 1 hour prior to chemotherapy 11/29/16  Yes Patrici Ranks, MD  megestrol (MEGACE) 400 MG/10ML suspension Take 10 mLs (400 mg total) by mouth 2 (two) times daily. 11/23/16  Yes Baird Cancer, PA-C  morphine (MSIR) 15 MG tablet Take 1 tablet (15 mg total) by mouth every 6 (six) hours as needed for severe pain. 11/21/16  Yes Manon Hilding Kefalas, PA-C  ondansetron (ZOFRAN-ODT) 8 MG disintegrating tablet Take 8 mg by mouth every 8 (eight) hours as needed for nausea or vomiting.   Yes Historical Provider, MD  PACLitaxel (TAXOL IV) Inject into the vein. Day 1, day 8, day 15, every 28 days   Yes Historical Provider, MD  potassium chloride SA (K-DUR,KLOR-CON) 20 MEQ tablet Take 3 tablets (60 mEq total) by mouth daily. 11/13/16  Yes Manon Hilding Kefalas, PA-C  furosemide (LASIX) 20 MG tablet Take 1 tablet (20 mg total) by mouth daily. 07/13/16   Patrici Ranks, MD  rivaroxaban (XARELTO) 20 MG TABS tablet Take 1 tablet (20 mg total) by mouth daily with supper. 12/15/16   Baird Cancer, PA-C    Current Facility-Administered Medications  Medication Dose Route Frequency Provider Last Rate Last Dose  . octreotide (SANDOSTATIN) 2 mcg/mL load via infusion 50 mcg  50 mcg Intravenous Once Sara Lee, MD      . octreotide (SANDOSTATIN) 500 mcg in sodium chloride 0.9 % 250 mL (2 mcg/mL) infusion  50 mcg/hr Intravenous Continuous Merrily Pew, MD      . pantoprazole (PROTONIX) 80 mg in sodium chloride 0.9 % 250 mL (0.32 mg/mL) infusion  8 mg/hr Intravenous Continuous Thurnell Lose, MD 25 mL/hr at 12/07/16 1350 8 mg/hr at 12/07/16 1350   Current  Outpatient Prescriptions  Medication Sig Dispense Refill  . CARBOPLATIN IV Inject into the vein. Day 1, day 8, day 15 every 28 days    . lidocaine-prilocaine (EMLA) cream Apply 1 application topically as needed. Apply quarter size amount to affected area 1 hour prior to chemotherapy 30 g 2  . megestrol (MEGACE) 400 MG/10ML suspension Take 10 mLs (400 mg total) by mouth 2 (two) times daily. 240 mL 2  . morphine (MSIR) 15 MG tablet Take 1 tablet (15 mg total) by mouth every 6 (six) hours as needed for severe pain. 60 tablet 0  . ondansetron (ZOFRAN-ODT) 8 MG disintegrating tablet Take 8 mg by mouth every 8 (eight) hours as needed for nausea or vomiting.    Marland Kitchen PACLitaxel (TAXOL IV) Inject into the vein. Day 1, day 8, day 15, every 28 days    . potassium chloride SA (K-DUR,KLOR-CON) 20 MEQ tablet Take 3 tablets (60 mEq total) by mouth daily. 60 tablet 1  . furosemide (LASIX) 20 MG tablet Take 1 tablet (20 mg total) by mouth daily. 30 tablet 1  . [START ON 12/15/2016] rivaroxaban (XARELTO) 20 MG TABS tablet Take 1 tablet (20 mg total) by mouth daily with supper. 30 tablet 3    Allergies as of 12/07/2016  . (No Known Allergies)    Family History  Problem Relation Age of Onset  . Hypertension Mother   . Diabetes Mother   . Cancer Neg Hx   . Heart disease Neg Hx   . Stroke Neg Hx     Social History   Social History  . Marital status: Single    Spouse name: N/A  . Number of children: 1  . Years of education: 38   Occupational History  . cook     disabled   Social History Main Topics  . Smoking status: Never Smoker  . Smokeless tobacco: Current User    Types: Chew     Comment: 5 years  . Alcohol use Yes     Comment: occ  . Drug use: No  . Sexual activity: Yes    Birth control/ protection: Post-menopausal   Other Topics Concern  . Not on file   Social History Narrative   Lives with mother, stays with fiancee during chemotherapy and medical treatments.       Review of  Systems: General: Negative for anorexia, weight loss, fever, chills, fatigue, weakness. ENT: Negative for hoarseness, difficulty swallowing. CV: Negative for chest pain, angina, palpitations, peripheral edema.  Respiratory: Negative for dyspnea at rest, cough, sputum, wheezing.  GI: See history of present illness. Endo: Negative for unusual weight change.  Heme: Noted oral bleeding. Allergy: Negative for rash or hives.  Physical Exam: Vital signs in last 24 hours: Temp:  [98.3 F (36.8 C)] 98.3 F (36.8 C) (12/14 0908) Pulse Rate:  [98-110] 104 (12/14 1230) Resp:  [11-16] 15 (12/14 1230) BP: (110-125)/(75-84) 117/80 (12/14 1230) SpO2:  [95 %-98 %] 95 % (12/14 1230) Weight:  [165 lb (74.8 kg)] 165 lb (74.8 kg) (12/14 0909)   General:   Alert,  Well-developed, well-nourished, pleasant and cooperative in NAD Head:  Normocephalic and atraumatic. Eyes:  No icterus. Conjunctiva pink. +scleral icterus. Ears:  Normal auditory acuity. Mouth:  No obvious blood noted. Neck:  Supple; no masses or thyromegaly. Lungs:  Clear throughout to auscultation. No wheezes, crackles, or rhonchi. No acute distress. Heart:  Regular rhythm; Tachycardic rate (115 on the monitor). No murmurs, clicks, rubs,  or gallops. Abdomen:  Firm and distended with mild fluid wave but non-tender. Normal bowel sounds, without guarding, and without rebound.   Rectal:  Deferred.   Msk:  Symmetrical without gross deformities. Extremities:  Without clubbing or edema. Neurologic:  Alert and  oriented x4;  grossly normal neurologically. Psych:  Alert and cooperative. Normal mood and affect. No obvious confusion.  Intake/Output from previous day: No intake/output data recorded. Intake/Output this shift: Total I/O In: 1150 [IV Piggyback:1150] Out: -   Lab Results:  Recent Labs  12/07/16 0940 12/07/16 1000  WBC 14.1*  --   HGB 11.9* 11.9*  HCT 35.3* 35.0*  PLT 139*  --    BMET  Recent Labs  12/07/16 0940  12/07/16 1000  NA 129* 133*  K 3.9 4.1  CL 94* 95*  CO2 25  --   GLUCOSE 116* 114*  BUN 9 8  CREATININE 0.55* 0.50*  CALCIUM 8.3*  --    LFT  Recent Labs  12/07/16 0940  PROT 7.4  ALBUMIN 2.3*  AST 219*  ALT 55  ALKPHOS 195*  BILITOT 7.6*   PT/INR  Recent Labs  12/07/16 0940  LABPROT 47.9*  INR 5.03*   Hepatitis Panel No results for input(s): HEPBSAG, HCVAB, HEPAIGM, HEPBIGM in the last 72 hours. C-Diff No results for input(s): CDIFFTOX in the last 72 hours.  Studies/Results: No results found.  Impression: Unfortunate situation of a 55 year old male with alcoholic cirrhosis as well as primary esophageal cancer which is progressed despite treatment. 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 He presented with complaints of single episode of GI bleed with red rectal blood last night. Also with 2 episodes of emesis and unquantifiable amount of blood. His abdomen has come more distended over the past 1-2 weeks and on exam is firm with a mild fluid wave likely ascites. His liver function seems to be decompensating with elevated bilirubin, alkaline phosphatase, AST, INR. Particularly concerning is his INR which is 5 today. He is on novel anticoagulants Eliquis for portal vein thrombosis but not on Coumadin and therefore should have no effect on his INR.  While as in the emergency department room examining the patient he began retching. I provided him with an emesis bag and he proceeded to vomit 3-4 times total amount approximately 250-500 mL's, obvious red blood hematemesis. There are several possible sources of his bleeding including portal gastropathy, esophageal varices known, esophageal cancer.  Unfortunately, given his elevated INR he is not a candidate for upper endoscopy or paracentesis at this time. It appears he has been given vitamin K by the emergency room physician. He continues to drink up until at least 2 weeks  ago.  Prognosis is quite poor and I'm concerned about him given his worsening, widespread metastatic disease with decompensated liver cirrhosis, widespread hepatic metastasis, active bleeding. He is noted to be a DO NOT RESUSCITATE at this time.  Plan: 1. Agree with octreotide drip: 50 mcg/hr for 5 days 2. Agree with Protonix drip given upper GI bleed 3. Agree with prophylactic antibiotics against spontaneous bacterial peritonitis given upper GI bleed in a cirrhotic patient: Rocephin 1g/day for 7 days 4. We will need to monitor his H&H closely as I presume it will likely dropped further given his active bleeding. There appears to be order for repeat CBC today, will enter orders for tomorrow 5. Follow liver function tests. Recheck INR tomorrow morning and make decisions about further measures to reduce INR. CMP and INR tomorrow 6. Supportive measures 7. Would likely benefit from palliative care consult. 8. Hold Xarelto   Thank you for allowing [place] to participate in the care of [place], [place], [place] [facility] Gerontological Nurse Practitioner [facility]    LOS: 0 days     12/07/2016, 2:01 PM

## 2016-12-07 NOTE — ED Notes (Signed)
Hospitalist at bedside at this time 

## 2016-12-07 NOTE — ED Notes (Signed)
CRITICAL VALUE ALERT  Critical value received:  Lactic Acid 3.4  Date of notification:  12/07/16  Time of notification:  1039  Critical value read back:Yes.    Nurse who received alert:  Allegra Lai, RN  MD notified (1st page):  Dr Lauretta Grill  Time of first page:  1039

## 2016-12-07 NOTE — Consult Note (Signed)
Consultation Note Date: 12/07/2016   Patient Name: Joshua Greene  DOB: 09-Jan-1961  MRN: [id number]  Age / Sex: 55 y.o., male  PCP: Raylene Everts, MD Referring Physician: Thurnell Lose, MD  Reason for Consultation: Establishing goals of care and Psychosocial/spiritual support  HPI/Patient Profile: 55 y.o. 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 bleed.   Clinical Assessment and Goals of Care: [name] is resting quietly in bed. He greets me trying to sit up, making and keeping eye contact as I enter. There is no family at bedside at this time. We talk about his current cancer treatment plan, he states that he sees Tom and Dr. Whitney Muse in the cancer center here at any point in hospital he is, somewhat confused, and having difficulty finding names.  We talk about 24 to 48 hours for testing and to figure out the best way to take care of him. We talk about his choices. I share that he decides whether he wants to take more chemotherapy, if it's offered.   We talk about healthcare power of attorney. He is unable to name a decision maker, I share that if he wishes to name his girlfriend we can help them complete this paperwork. We talk about his choice to allow a natural death. I share that this was a wise choice and ask if his girlfriend could about his wishes.  He states that he feels she would. I share that I will follow-up with the cancer center and him if he is here on Monday.  [name] is unable to participate fully in a palliative discussion today, PMT will continue to follow.  Healthcare power of attorney OTHER - Mr. Noll is unable to name a healthcare power of  attorney at this time.   SUMMARY OF RECOMMENDATIONS   24 to 48 hours for test results, treat the treatable but no extraordinary measures such as CPR or intubation.   Code Status/Advance Care Planning:  DNR  Symptom Management:   per hospitalist  Palliative Prophylaxis:   Frequent Pain Assessment  Additional Recommendations (Limitations, Scope, Preferences):  Treat the treatable at this point, but no extraordinary measures such as CPR or intubation. We need to discuss the option of continued to chemotherapy treatment in further detail  Psycho-social/Spiritual:   Desire for further Chaplaincy support:no  Additional Recommendations: Caregiving  Support/Resources and Education on Hospice  Prognosis:   Unable to determine, based on outcomes, and cancer therapy treatments offered.  Discharge Planning: To Be Determined      Primary Diagnoses: Present on Admission: . GI bleed . Alcohol abuse . Esophageal cancer ([place]) . Liver metastases ([place]) . Portal vein thrombosis   I have reviewed the medical record, interviewed the patient and family, and examined the patient. The following aspects are pertinent.  Past Medical History:  Diagnosis Date  .  Alcohol abuse    been through rehab  . Allergy   . Anemia   . Asthma    childhood  . Chicken pox   . Chronic bronchitis ([place])   . Diabetes mellitus without complication ([place])   . Erectile dysfunction   . Esophageal cancer ([place]) 05/22/2016  . History of stomach ulcers   . Hypertension   . Portal vein thrombosis 11/23/2016   Social History   Social History  . Marital status: Single    Spouse name: N/A  . Number of children: 1  . Years of education: 38   Occupational History  . cook     disabled   Social History Main Topics  . Smoking status: Never Smoker  . Smokeless tobacco: Current User    Types: Chew     Comment: 5 years  . Alcohol use Yes     Comment: occ  . Drug use: No  . Sexual activity: Yes    Birth  control/ protection: Post-menopausal   Other Topics Concern  . None   Social History Narrative   Lives with mother, stays with fiancee during chemotherapy and medical treatments.      Family History  Problem Relation Age of Onset  . Hypertension Mother   . Diabetes Mother   . Cancer Neg Hx   . Heart disease Neg Hx   . Stroke Neg Hx    Scheduled Meds: . octreotide  50 mcg Intravenous Once   Continuous Infusions: . [START ON 12/08/2016] cefTRIAXone (ROCEPHIN)  IV    . octreotide  (SANDOSTATIN)    IV infusion    . pantoprozole (PROTONIX) infusion 8 mg/hr (12/07/16 1350)   PRN Meds:. Medications Prior to Admission:  Prior to Admission medications   Medication Sig Start Date End Date Taking? Authorizing Provider  CARBOPLATIN IV Inject into the vein. Day 1, day 8, day 15 every 28 days   Yes Historical Provider, MD  lidocaine-prilocaine (EMLA) cream Apply 1 application topically as needed. Apply quarter size amount to affected area 1 hour prior to chemotherapy 11/29/16  Yes Patrici Ranks, MD  megestrol (MEGACE) 400 MG/10ML suspension Take 10 mLs (400 mg total) by mouth 2 (two) times daily. 11/23/16  Yes Baird Cancer, PA-C  morphine (MSIR) 15 MG tablet Take 1 tablet (15 mg total) by mouth every 6 (six) hours as needed for severe pain. 11/21/16  Yes Manon Hilding Kefalas, PA-C  ondansetron (ZOFRAN-ODT) 8 MG disintegrating tablet Take 8 mg by mouth every 8 (eight) hours as needed for nausea or vomiting.   Yes Historical Provider, MD  PACLitaxel (TAXOL IV) Inject into the vein. Day 1, day 8, day 15, every 28 days   Yes Historical Provider, MD  potassium chloride SA (K-DUR,KLOR-CON) 20 MEQ tablet Take 3 tablets (60 mEq total) by mouth daily. 11/13/16  Yes Manon Hilding Kefalas, PA-C  furosemide (LASIX) 20 MG tablet Take 1 tablet (20 mg total) by mouth daily. 07/13/16   Patrici Ranks, MD  rivaroxaban (XARELTO) 20 MG TABS tablet Take 1 tablet (20 mg total) by mouth daily with supper. 12/15/16    Baird Cancer, PA-C   No Known Allergies Review of Systems  Unable to perform ROS: Acuity of condition    Physical Exam  Constitutional: No distress.  Frail and thin, chronically ill appearing, no acute distress  HENT:  Head: Normocephalic and atraumatic.  Cardiovascular: Normal rate and regular rhythm.   Pulmonary/Chest: Effort normal. No respiratory distress.  Abdominal: He exhibits  distension. There is no guarding.  Distended abdomen, tight  Musculoskeletal:  Some muscle wasting  Skin: Skin is warm and dry.  Nursing note and vitals reviewed.   Vital Signs: BP 135/90   Pulse 103   Temp 98.3 F (36.8 C) (Oral)   Resp 18   Ht '6\' 2"'$  (1.88 m)   Wt 74.8 kg (165 lb)   SpO2 97%   BMI 21.18 kg/m  Pain Assessment: 0-10   Pain Score: 0-No pain   SpO2: SpO2: 97 % O2 Device:SpO2: 97 % O2 Flow Rate: .   IO: Intake/output summary:  Intake/Output Summary (Last 24 hours) at 12/07/16 1601 Last data filed at 12/07/16 1345  Gross per 24 hour  Intake             2150 ml  Output                0 ml  Net             2150 ml    LBM:   Baseline Weight: Weight: 74.8 kg (165 lb) Most recent weight: Weight: 74.8 kg (165 lb)     Palliative Assessment/Data:   Flowsheet Rows   Flowsheet Row Most Recent Value  Intake Tab  Referral Department  Hospitalist  Unit at Time of Referral  ER  Palliative Care Primary Diagnosis  Cancer  Date Notified  12/07/16  Palliative Care Type  New Palliative care  Reason for referral  Clarify Goals of Care  Date of Admission  12/07/16  Date first seen by Palliative Care  12/07/16  # of days Palliative referral response time  0 Day(s)  # of days IP prior to Palliative referral  0  Clinical Assessment  Palliative Performance Scale Score  60%  Pain Max last 24 hours  Not able to report  Pain Min Last 24 hours  Not able to report  Dyspnea Max Last 24 Hours  Not able to report  Dyspnea Min Last 24 hours  Not able to report  Psychosocial &  Spiritual Assessment  Palliative Care Outcomes  Patient/Family meeting held?  Yes  Who was at the meeting?  Patient only today  Palliative Care Outcomes  Counseled regarding hospice, Provided psychosocial or spiritual support  Patient/Family wishes: Interventions discontinued/not started   Mechanical Ventilation  Palliative Care follow-up planned  -- [Follow-up while at [facility]]      Time In: 1500 Time Out: 1530 Time Total: 30 minutes Greater than 50%  of this time was spent counseling and coordinating care related to the above assessment and plan.  Signed by: Drue Novel, NP   Please contact Palliative Medicine Team phone at [phone] for questions and concerns.  For individual provider: See Shea Evans

## 2016-12-07 NOTE — ED Triage Notes (Signed)
Pt reports had bright red blood in stool this morning.  Denies any abd pain but reports vomiting.  Pt has throat, esophageal, and liver cancer and was supposed to have chemo treatment today.  Last treatment was approx 1 month ago.

## 2016-12-07 NOTE — ED Provider Notes (Signed)
[facility] DEPT Provider Note   CSN: [id number] Arrival date & time: 12/07/16  3546  By signing my name below, I, Rayna Sexton, attest that this documentation has been prepared under the direction and in the presence of Merrily Pew, MD. Electronically Signed: Rayna Sexton, ED Scribe. 12/07/16. 9:31 AM.   History   Chief Complaint Chief Complaint  Patient presents with  . GI Bleeding    HPI HPI Comments: [facility] Greene is a 55 y.o. male who presents to the Emergency Department complaining of an episode of rectal bleeding that occurred last night at 3:00 am. Pt attempted to go to the bathroom bled a large amount of blood rectally on the floor of his home last night. He reports associated oral bleeding and presents with his teeth covered in a mild amount of blood. He also complains of mild abdominal tenderness as well as abdominal distension which has been an ongoing issue. He reports associated vomiting which he told the nursing staff "was around two times". Pt is a current cancer pt battling throat, esophageal and liver cancer. Pt states his cancer "treatable but not curable". He is scheduled to have chemo today. Pt has a h/o alcoholism and cirrhosis.   The history is provided by the patient and medical records. No language interpreter was used.    Past Medical History:  Diagnosis Date  . Alcohol abuse    been through rehab  . Allergy   . Anemia   . Asthma    childhood  . Chicken pox   . Chronic bronchitis ([place])   . Diabetes mellitus without complication ([place])   . Erectile dysfunction   . Esophageal cancer ([place]) 05/22/2016  . History of stomach ulcers   . Hypertension   . Portal vein thrombosis 11/23/2016    Patient Active Problem List   Diagnosis Date Noted  . GI bleed 12/07/2016  . Alcoholic cirrhosis of liver with ascites ([place])   . Oral bleeding   . Decompensated liver disease ([place])   . Tachycardia   . Portal vein thrombosis 11/23/2016  . Thrush of mouth  and esophagus ([place]) 08/11/2016  . Bacterial intestinal infection 07/13/2016  . Liver metastases ([place]) 05/29/2016  . Esophageal cancer ([place]) 05/22/2016  . Alcohol abuse 08/02/2015    Past Surgical History:  Procedure Laterality Date  . APPENDECTOMY  1978  . ESOPHAGOGASTRODUODENOSCOPY N/A 05/25/2016   Procedure: ESOPHAGOGASTRODUODENOSCOPY (EGD);  Surgeon: Daneil Dolin, MD;  Location: [facility];  Service: Endoscopy;  Laterality: N/A;  . HERNIA REPAIR         Home Medications    Prior to Admission medications   Medication Sig Start Date End Date Taking? Authorizing Provider  CARBOPLATIN IV Inject into the vein. Day 1, day 8, day 15 every 28 days   Yes Historical Provider, MD  lidocaine-prilocaine (EMLA) cream Apply 1 application topically as needed. Apply quarter size amount to affected area 1 hour prior to chemotherapy 11/29/16  Yes Patrici Ranks, MD  megestrol (MEGACE) 400 MG/10ML suspension Take 10 mLs (400 mg total) by mouth 2 (two) times daily. 11/23/16  Yes Baird Cancer, PA-C  morphine (MSIR) 15 MG tablet Take 1 tablet (15 mg total) by mouth every 6 (six) hours as needed for severe pain. 11/21/16  Yes Manon Hilding Kefalas, PA-C  ondansetron (ZOFRAN-ODT) 8 MG disintegrating tablet Take 8 mg by mouth every 8 (eight) hours as needed for nausea or vomiting.   Yes Historical Provider, MD  PACLitaxel (TAXOL IV) Inject into  the vein. Day 1, day 8, day 15, every 28 days   Yes Historical Provider, MD  potassium chloride SA (K-DUR,KLOR-CON) 20 MEQ tablet Take 3 tablets (60 mEq total) by mouth daily. 11/13/16  Yes Manon Hilding Kefalas, PA-C  furosemide (LASIX) 20 MG tablet Take 1 tablet (20 mg total) by mouth daily. 07/13/16   Patrici Ranks, MD  rivaroxaban (XARELTO) 20 MG TABS tablet Take 1 tablet (20 mg total) by mouth daily with supper. 12/15/16   Baird Cancer, PA-C    Family History Family History  Problem Relation Age of Onset  . Hypertension Mother   . Diabetes Mother   .  Cancer Neg Hx   . Heart disease Neg Hx   . Stroke Neg Hx     Social History Social History  Substance Use Topics  . Smoking status: Never Smoker  . Smokeless tobacco: Current User    Types: Chew     Comment: 5 years  . Alcohol use Yes     Comment: occ     Allergies   Patient has no known allergies.   Review of Systems Review of Systems  Gastrointestinal: Positive for abdominal distention, abdominal pain, anal bleeding and blood in stool.  Allergic/Immunologic: Positive for immunocompromised state.  All other systems reviewed and are negative.  Physical Exam Updated Vital Signs BP 135/90   Pulse 103   Temp 98.3 F (36.8 C) (Oral)   Resp 18   Ht '6\' 2"'$  (1.88 m)   Wt 165 lb (74.8 kg)   SpO2 97%   BMI 21.18 kg/m   Physical Exam  Constitutional: He is oriented to person, place, and time. He appears well-developed.  HENT:  Head: Normocephalic and atraumatic.  Mouth/Throat: Mucous membranes are dry.  Dry blood noted on the teeth. Chapped bloody lips.   Eyes: EOM are normal. Scleral icterus is present.  Neck: Normal range of motion.  Cardiovascular: Normal rate.   Pulmonary/Chest: Effort normal.  Abdominal: Soft. He exhibits distension. There is no tenderness.  Musculoskeletal: Normal range of motion. He exhibits edema.  2+ pitting edema noted to the BLEs to the mid shin  Neurological: He is alert and oriented to person, place, and time.  Sleepy but alert to voice and oriented to person, time and situation  Skin: Skin is warm and dry.  Psychiatric: He has a normal mood and affect. Judgment normal.  Nursing note and vitals reviewed.  ED Treatments / Results  Labs (all labs ordered are listed, but only abnormal results are displayed) Labs Reviewed  CBC WITH DIFFERENTIAL/PLATELET - Abnormal; Notable for the following:       Result Value   WBC 14.1 (*)    RBC 3.67 (*)    Hemoglobin 11.9 (*)    HCT 35.3 (*)    RDW 16.4 (*)    Platelets 139 (*)    Neutro Abs  10.1 (*)    Monocytes Absolute 2.2 (*)    All other components within normal limits  COMPREHENSIVE METABOLIC PANEL - Abnormal; Notable for the following:    Sodium 129 (*)    Chloride 94 (*)    Glucose, Bld 116 (*)    Creatinine, Ser 0.55 (*)    Calcium 8.3 (*)    Albumin 2.3 (*)    AST 219 (*)    Alkaline Phosphatase 195 (*)    Total Bilirubin 7.6 (*)    All other components within normal limits  LACTIC ACID, PLASMA - Abnormal; Notable for the  following:    Lactic Acid, Venous 3.4 (*)    All other components within normal limits  LACTIC ACID, PLASMA - Abnormal; Notable for the following:    Lactic Acid, Venous 2.7 (*)    All other components within normal limits  AMMONIA - Abnormal; Notable for the following:    Ammonia 87 (*)    All other components within normal limits  PROTIME-INR - Abnormal; Notable for the following:    Prothrombin Time 47.9 (*)    INR 5.03 (*)    All other components within normal limits  HEMOGLOBIN AND HEMATOCRIT, BLOOD - Abnormal; Notable for the following:    Hemoglobin 11.4 (*)    HCT 34.4 (*)    All other components within normal limits  I-STAT CHEM 8, ED - Abnormal; Notable for the following:    Sodium 133 (*)    Chloride 95 (*)    Creatinine, Ser 0.50 (*)    Glucose, Bld 114 (*)    Calcium, Ion 1.01 (*)    Hemoglobin 11.9 (*)    HCT 35.0 (*)    All other components within normal limits  HEMOGLOBIN AND HEMATOCRIT, BLOOD  HEMOGLOBIN AND HEMATOCRIT, BLOOD  POC OCCULT BLOOD, ED  TYPE AND SCREEN  PREPARE FRESH FROZEN PLASMA    EKG  EKG Interpretation None       Radiology No results found.  Procedures Procedures  DIAGNOSTIC STUDIES: Oxygen Saturation is 96% on RA, normal by my interpretation.    COORDINATION OF CARE: 9:31 AM Discussed next steps with pt. Pt verbalized understanding and is agreeable with the plan.    Medications Ordered in ED Medications  pantoprazole (PROTONIX) 80 mg in sodium chloride 0.9 % 250 mL (0.32  mg/mL) infusion (8 mg/hr Intravenous New Bag/Given 12/07/16 1350)  octreotide (SANDOSTATIN) 2 mcg/mL load via infusion 50 mcg (not administered)  octreotide (SANDOSTATIN) 500 mcg in sodium chloride 0.9 % 250 mL (2 mcg/mL) infusion (not administered)  cefTRIAXone (ROCEPHIN) 1 g in dextrose 5 % 50 mL IVPB (not administered)  sodium chloride 0.9 % bolus 1,000 mL (0 mLs Intravenous Stopped 12/07/16 1157)  cefTRIAXone (ROCEPHIN) 1 g in dextrose 5 % 50 mL IVPB (0 g Intravenous Stopped 12/07/16 1157)  pantoprazole (PROTONIX) 80 mg in sodium chloride 0.9 % 100 mL IVPB (0 mg Intravenous Stopped 12/07/16 1200)  sodium chloride 0.9 % bolus 1,000 mL (0 mLs Intravenous Stopped 12/07/16 1345)  0.9 %  sodium chloride infusion ( Intravenous New Bag/Given 12/07/16 1349)  phytonadione (VITAMIN K) SQ injection 5 mg (5 mg Subcutaneous Given 12/07/16 1325)     Initial Impression / Assessment and Plan / ED Course  I have reviewed the triage vital signs and the nursing notes.  Pertinent labs & imaging results that were available during my care of the patient were reviewed by me and considered in my medical decision making (see chart for details).  Clinical Course    Patient with couple episodes of hematemesis and one big episode of hematochezia prior to arrival here. He is tachycardic but no hypotension. His INR is above 5 he is on xarelto but no Coumadin. We will give FFP and vitamin K to see if this helps. He has significant lower extremity edema and ascites likely from his cirrhosis. Rest of his labs are relatively okay. Supratherapeutic INR.  Rocephin given for concern of possible variceal bleed.  protonix given.  Type/screen done.  DNR/DNI  will consult for GI and triad admission.  D/w Dr. Candiss Norse who recommended  stepdown admission and ffp/vit k  Discussed with GI, Dr. Gala Romney, recommended octreotide and will see.   I personally performed the services described in this documentation, which was scribed in my  presence. The recorded information has been reviewed and is accurate.    Final Clinical Impressions(s) / ED Diagnoses   Final diagnoses:  Gastrointestinal hemorrhage, unspecified gastrointestinal hemorrhage type  Supratherapeutic INR  Tachycardia    New Prescriptions New Prescriptions   No medications on file     Merrily Pew, MD 12/07/16 1516

## 2016-12-07 NOTE — H&P (Addendum)
[facility] H&P   Patient Demographics:    [facility], is a 55 y.o. male  MRN: [id number]   DOB - 01/17/1961  Admit Date - 12/07/2016  Outpatient Primary MD for the patient is Raylene Everts, MD     Patient coming from: Home  Chief Complaint  Patient presents with  . GI Bleeding      HPI:    [facility]  is a 55 y.o. 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 In the ER H&H is stable, INR is close to 5, patient is hemodynamically stable and I was called to admit.  He denies any fever or chills, no headache chest or abdominal pain, his BUN was found to be normal, H&H so far is stable. Currently besides overall feeling of unease and mild abdominal ache he says he feels okay.    Review of systems:    In addition to the HPI above,   No Fever-chills, No Headache, No changes with Vision or hearing, No problems swallowing food or Liquids, No Chest pain, Cough or Shortness of Breath, No Abdominal pain, No Nausea or Vommitting, Bowel movements are regular, No Blood in Urine, No dysuria, No new skin rashes or bruises, No new joints pains-aches,  No new weakness, tingling, numbness in any extremity, No recent weight gain or loss, No polyuria, polydypsia or polyphagia, No significant Mental Stressors.  A full 10 point Review of Systems was  done, except as stated above, all other Review of Systems were negative.   With Past History of the following :    Past Medical History:  Diagnosis Date  . Alcohol abuse    been through rehab  . Allergy   . Anemia   . Asthma    childhood  . Chicken pox   . Chronic bronchitis ([place])   . Diabetes mellitus without complication ([place])   . Erectile dysfunction   . Esophageal cancer ([place]) 05/22/2016  . History of stomach ulcers   .  Hypertension   . Portal vein thrombosis 11/23/2016      Past Surgical History:  Procedure Laterality Date  . APPENDECTOMY  1978  . ESOPHAGOGASTRODUODENOSCOPY N/A 05/25/2016   Procedure: ESOPHAGOGASTRODUODENOSCOPY (EGD);  Surgeon: Daneil Dolin, MD;  Location[facility];  Service: Endoscopy;  Laterality: N/A;  . HERNIA REPAIR        Social History:     Social History  Substance Use Topics  . Smoking status: Never Smoker  . Smokeless tobacco: Current User    Types: Chew     Comment: 5 years  . Alcohol use Yes     Comment: occ         Family History :     Family History  Problem Relation Age of Onset  . Hypertension Mother   . Diabetes Mother   . Cancer Neg Hx   . Heart disease Neg Hx   . Stroke Neg Hx        Home Medications:   Prior to Admission medications   Medication Sig Start Date End Date Taking? Authorizing Provider  CARBOPLATIN IV Inject into the vein. Day 1, day 8, day 15 every 28 days   Yes Historical Provider, MD  lidocaine-prilocaine (EMLA) cream Apply 1 application topically as needed. Apply quarter size amount to affected area 1 hour prior to chemotherapy 11/29/16  Yes Patrici Ranks, MD  megestrol (MEGACE) 400 MG/10ML suspension Take 10 mLs (400 mg total) by mouth 2 (two) times daily. 11/23/16  Yes Baird Cancer, PA-C  morphine (MSIR) 15 MG tablet Take 1 tablet (15 mg total) by mouth every 6 (six) hours as needed for severe pain. 11/21/16  Yes Manon Hilding Kefalas, PA-C  ondansetron (ZOFRAN-ODT) 8 MG  disintegrating tablet Take 8 mg by mouth every 8 (eight) hours as needed for nausea or vomiting.   Yes Historical Provider, MD  PACLitaxel (TAXOL IV) Inject into the vein. Day 1, day 8, day 15, every 28 days   Yes Historical Provider, MD  potassium chloride SA (K-DUR,KLOR-CON) 20 MEQ tablet Take 3 tablets (60 mEq total) by mouth daily. 11/13/16  Yes Manon Hilding Kefalas, PA-C  furosemide (LASIX) 20 MG tablet Take 1 tablet (20 mg total) by mouth daily. 07/13/16   Patrici Ranks, MD  rivaroxaban (XARELTO) 20 MG TABS tablet Take 1 tablet (20 mg total) by mouth daily with supper. 12/15/16   Baird Cancer, PA-C     Allergies:    No Known Allergies   Physical Exam:   Vitals  Blood pressure 117/80, pulse 104, temperature 98.3 F (36.8 C), temperature source Oral, resp. rate 15, height '6\' 2"'$  (1.88 m), weight 74.8 kg (165 lb), SpO2 95 %.   1. General Thin tall middle-aged African-American male lying in bed in NAD,     2. Normal affect and insight, Not Suicidal or Homicidal, Awake Alert, Oriented X 3.  3. No F.N deficits, ALL C.Nerves Intact, Strength 5/5 all 4 extremities, Sensation intact all 4 extremities, Plantars down going.  4. Ears and Eyes appear Normal, Conjunctivae clear, PERRLA. Moist Oral Mucosa.  5. Supple Neck, No JVD, No cervical lymphadenopathy appriciated, No Carotid Bruits.  6. Symmetrical Chest wall movement, Good air movement bilaterally, CTAB.  7. RRR, No Gallops, Rubs or Murmurs, No Parasternal Heave.  8. Positive Bowel Sounds, Abdomen Soft, mildly distended with possible mild ascites but no tenderness, No organomegaly appriciated,No rebound -guarding or rigidity.  9.  No Cyanosis, Normal Skin Turgor, No Skin Rash or  Bruise.  10. Good muscle tone,  joints appear normal , no effusions, Normal ROM.  11. No Palpable Lymph Nodes in Neck or Axillae      Data Review:    CBC  Recent Labs Lab 12/07/16 0940 12/07/16 1000  WBC 14.1*  --   HGB 11.9* 11.9*  HCT  35.3* 35.0*  PLT 139*  --   MCV 96.2  --   MCH 32.4  --   MCHC 33.7  --   RDW 16.4*  --   LYMPHSABS 1.6  --   MONOABS 2.2*  --   EOSABS 0.1  --   BASOSABS 0.1  --    ------------------------------------------------------------------------------------------------------------------  Chemistries   Recent Labs Lab 12/07/16 0940 12/07/16 1000  NA 129* 133*  K 3.9 4.1  CL 94* 95*  CO2 25  --   GLUCOSE 116* 114*  BUN 9 8  CREATININE 0.55* 0.50*  CALCIUM 8.3*  --   AST 219*  --   ALT 55  --   ALKPHOS 195*  --   BILITOT 7.6*  --    ------------------------------------------------------------------------------------------------------------------ estimated creatinine clearance is 107.8 mL/min (by C-G formula based on SCr of 0.5 mg/dL (L)). ------------------------------------------------------------------------------------------------------------------ No results for input(s): TSH, T4TOTAL, T3FREE, THYROIDAB in the last 72 hours.  Invalid input(s): FREET3  Coagulation profile  Recent Labs Lab 12/07/16 0940  INR 5.03*   ------------------------------------------------------------------------------------------------------------------- No results for input(s): DDIMER in the last 72 hours. -------------------------------------------------------------------------------------------------------------------  Cardiac Enzymes No results for input(s): CKMB, TROPONINI, MYOGLOBIN in the last 168 hours.  Invalid input(s): CK ------------------------------------------------------------------------------------------------------------------    Component Value Date/Time   BNP 31.0 05/03/2016 2231     ---------------------------------------------------------------------------------------------------------------  Urinalysis    Component Value Date/Time   COLORURINE Yellow 12/03/2014 1142   APPEARANCEUR Clear 12/03/2014 1142   LABSPEC 1.005 12/03/2014 1142   PHURINE 5.0  12/03/2014 1142   GLUCOSEU Negative 12/03/2014 1142   HGBUR Negative 12/03/2014 1142   BILIRUBINUR Negative 12/03/2014 1142   KETONESUR Negative 12/03/2014 1142   PROTEINUR Negative 12/03/2014 1142   NITRITE Negative 12/03/2014 1142   LEUKOCYTESUR Negative 12/03/2014 1142    ----------------------------------------------------------------------------------------------------------------   Imaging Results:    No results found.      Assessment & Plan:     1. GI bleed. Source unclear but most likely lower. Currently place him on clear liquid diet, IV PPI, monitor H&H, type screen, since he is on xaralto and INR is elevated will give him FFP's, if his H&H is dropping we will place him on [place]. Protocol, GI has been consulted as patient is at risk for developing varices due to portal hypertension. Obviously xaralto will be held.  2. 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 Overall prognosis is poor, for now supportive care, I have informed his oncologist. [name]. Palliative care will be involved. He wants to be DO NOT RESUSCITATE. If he declines full comfort care.  3. Remote history of alcohol abuse. Stable.  4. History of portal vein thrombosis. Obviously xaralto held. Supportive care for now.   DVT Prophylaxis  SCDs    AM Labs Ordered, also please review Full Orders  Family Communication: Admission, patients condition and plan of care including tests being ordered have been discussed with the patient and  he indicates understanding and agree with the plan and Code Status.  Code Status DNR  Likely DC to  Home 2-3 days  Condition GUARDED    Consults called: GI  Admission status: Inpt    Time spent in minutes : 35   SINGH,PRASHANT K M.D on 12/07/2016 at 1:10 PM  Between 7am to 7pm - Pager - [phone]. After 7pm go to [email or web address] - password [email or web address]  [facility] - Office  [phone]

## 2016-12-07 NOTE — Progress Notes (Deleted)
Patient's fiance called back, reported patient bleeding from rectum this morning. Fiance reported they were in parking lot and coming up to the cancer center. I informed Dr.Penland of info and she stated that he needs to go to the Emergency Department. Met patient and fiance at elevator, escorted them down to the Emergency Department to be registered.

## 2016-12-07 NOTE — ED Notes (Signed)
No need for POC hemoccult due to gross blood from rectum per Dr. Dayna Barker.

## 2016-12-07 NOTE — ED Notes (Signed)
CRITICAL VALUE ALERT  Critical value received:  INR 5.03  Date of notification:  12/07/16  Time of notification:  9150  Critical value read back:Yes.    Nurse who received alert:  Allegra Lai, RN  MD notified (1st page):  Dr Lauretta Grill  Time of first page:  [phone]

## 2016-12-08 ENCOUNTER — Inpatient Hospital Stay (HOSPITAL_COMMUNITY): Payer: Medicaid Other

## 2016-12-08 ENCOUNTER — Encounter (HOSPITAL_COMMUNITY): Payer: Self-pay

## 2016-12-08 ENCOUNTER — Ambulatory Visit (HOSPITAL_COMMUNITY): Payer: Self-pay | Admitting: Hematology & Oncology

## 2016-12-08 ENCOUNTER — Ambulatory Visit (HOSPITAL_COMMUNITY): Payer: Self-pay

## 2016-12-08 DIAGNOSIS — I8510 Secondary esophageal varices without bleeding: Secondary | ICD-10-CM

## 2016-12-08 DIAGNOSIS — K922 Gastrointestinal hemorrhage, unspecified: Secondary | ICD-10-CM

## 2016-12-08 DIAGNOSIS — I8500 Esophageal varices without bleeding: Secondary | ICD-10-CM | POA: Diagnosis present

## 2016-12-08 DIAGNOSIS — R791 Abnormal coagulation profile: Secondary | ICD-10-CM

## 2016-12-08 LAB — COMPREHENSIVE METABOLIC PANEL
ALBUMIN: 2.4 g/dL — AB (ref 3.5–5.0)
ALK PHOS: 176 U/L — AB (ref 38–126)
ALK PHOS: 174 U/L — AB (ref 38–126)
ALT: 47 U/L (ref 17–63)
ALT: 50 U/L (ref 17–63)
AST: 186 U/L — AB (ref 15–41)
AST: 195 U/L — AB (ref 15–41)
Albumin: 2.4 g/dL — ABNORMAL LOW (ref 3.5–5.0)
Anion gap: 10 (ref 5–15)
Anion gap: 9 (ref 5–15)
BILIRUBIN TOTAL: 7.6 mg/dL — AB (ref 0.3–1.2)
BUN: 7 mg/dL (ref 6–20)
BUN: 6 mg/dL (ref 6–20)
CALCIUM: 7.9 mg/dL — AB (ref 8.9–10.3)
CALCIUM: 8 mg/dL — AB (ref 8.9–10.3)
CHLORIDE: 98 mmol/L — AB (ref 101–111)
CO2: 23 mmol/L (ref 22–32)
CO2: 24 mmol/L (ref 22–32)
CREATININE: 0.47 mg/dL — AB (ref 0.61–1.24)
Chloride: 97 mmol/L — ABNORMAL LOW (ref 101–111)
Creatinine, Ser: 0.49 mg/dL — ABNORMAL LOW (ref 0.61–1.24)
GFR calc Af Amer: >60 mL/min (ref >60)
GFR calc non Af Amer: >60 mL/min (ref >60)
GLUCOSE: 113 mg/dL — AB (ref 65–99)
GLUCOSE: 127 mg/dL — AB (ref 65–99)
POTASSIUM: 3.9 mmol/L (ref 3.5–5.1)
Potassium: 3.8 mmol/L (ref 3.5–5.1)
SODIUM: 131 mmol/L — AB (ref 135–145)
Sodium: 130 mmol/L — ABNORMAL LOW (ref 135–145)
Total Bilirubin: 7.1 mg/dL — ABNORMAL HIGH (ref 0.3–1.2)
Total Protein: 6.9 g/dL (ref 6.5–8.1)
Total Protein: 7.3 g/dL (ref 6.5–8.1)

## 2016-12-08 LAB — HEMOGLOBIN AND HEMATOCRIT, BLOOD
HCT: 31.8 % — ABNORMAL LOW (ref 39.0–52.0)
HEMATOCRIT: 30.2 % — AB (ref 39.0–52.0)
HEMOGLOBIN: 10.6 g/dL — AB (ref 13.0–17.0)
Hemoglobin: 10.1 g/dL — ABNORMAL LOW (ref 13.0–17.0)

## 2016-12-08 LAB — CBC WITH DIFFERENTIAL/PLATELET
BASOS ABS: 0.1 10*3/uL (ref 0.0–0.1)
Basophils Relative: 0 %
EOS ABS: 0.1 10*3/uL (ref 0.0–0.7)
EOS PCT: 1 %
HCT: 30.6 % — ABNORMAL LOW (ref 39.0–52.0)
HEMOGLOBIN: 10.2 g/dL — AB (ref 13.0–17.0)
LYMPHS ABS: 1.8 10*3/uL (ref 0.7–4.0)
LYMPHS PCT: 13 %
MCH: 32.4 pg (ref 26.0–34.0)
MCHC: 33.3 g/dL (ref 30.0–36.0)
MCV: 97.1 fL (ref 78.0–100.0)
Monocytes Absolute: 1.9 10*3/uL — ABNORMAL HIGH (ref 0.1–1.0)
Monocytes Relative: 14 %
NEUTROS PCT: 72 %
Neutro Abs: 9.9 10*3/uL — ABNORMAL HIGH (ref 1.7–7.7)
PLATELETS: 110 10*3/uL — AB (ref 150–400)
RBC: 3.15 MIL/uL — AB (ref 4.22–5.81)
RDW: 16.8 % — ABNORMAL HIGH (ref 11.5–15.5)
WBC: 13.7 10*3/uL — AB (ref 4.0–10.5)

## 2016-12-08 LAB — PROTIME-INR
INR: 2.34
Prothrombin Time: 26.1 seconds — ABNORMAL HIGH (ref 11.4–15.2)

## 2016-12-08 LAB — MRSA PCR SCREENING: MRSA by PCR: NEGATIVE

## 2016-12-08 LAB — OSMOLALITY: Osmolality: 283 mOsm/kg (ref 275–295)

## 2016-12-08 LAB — URIC ACID: URIC ACID, SERUM: 5.5 mg/dL (ref 4.4–7.6)

## 2016-12-08 MED ORDER — SORBITOL 70 % SOLN
960.0000 mL | TOPICAL_OIL | Freq: Once | ORAL | Status: DC
  Filled 2016-12-08: qty 240

## 2016-12-08 MED ORDER — BISACODYL 10 MG RE SUPP
10.0000 mg | Freq: Every day | RECTAL | Status: DC
  Administered 2016-12-08 – 2016-12-11 (×4): 10 mg via RECTAL
  Filled 2016-12-08 (×4): qty 1

## 2016-12-08 MED ORDER — PRO-STAT SUGAR FREE PO LIQD
30.0000 mL | Freq: Two times a day (BID) | ORAL | Status: DC
  Administered 2016-12-08 – 2016-12-11 (×6): 30 mL via ORAL
  Filled 2016-12-08 (×6): qty 30

## 2016-12-08 MED ORDER — DOCUSATE SODIUM 100 MG PO CAPS
200.0000 mg | ORAL_CAPSULE | Freq: Two times a day (BID) | ORAL | Status: DC
  Administered 2016-12-08 – 2016-12-11 (×6): 200 mg via ORAL
  Filled 2016-12-08 (×6): qty 2

## 2016-12-08 NOTE — Progress Notes (Signed)
Subjective: Today he states he is feeling better. Wants grits, has clear liquids at this point. Per patient and nursing staff no further upper or lower GI bleed overnight. Did have some abdominal pain yesterday, improved with pain medications. Bilateral ankle edema has worsening slightly.  Objective: Vital signs in last 24 hours: Temp:  [97.5 F (36.4 C)-98.7 F (37.1 C)] 98.1 F (36.7 C) (12/15 0130) Pulse Rate:  [88-110] 88 (12/14 1900) Resp:  [9-25] 15 (12/14 1900) BP: (110-135)/(75-90) 133/84 (12/14 2300) SpO2:  [94 %-100 %] 98 % (12/14 1939) Weight:  [165 lb (74.8 kg)] 165 lb (74.8 kg) (12/14 0909) Last BM Date: 12/07/16 General:   Alert and oriented, pleasant Head:  Normocephalic and atraumatic. Eyes:  No icterus, sclera clear. Conjuctiva pink.  Heart:  S1, S2 present, no murmurs noted.  Lungs: Clear to auscultation bilaterally, without wheezing, rales, or rhonchi.  Abdomen:  Bowel sounds present, firm and distended with umbilical hernia and mild fluid wave but non-tender this morning. No HSM or hernias noted. No rebound or guarding. No masses appreciated  Msk:  Symmetrical without gross deformities. Pulses:  Normal bilateral pedal pulses noted. Extremities:  Without clubbing or edema. Neurologic:  Alert and  oriented x4;  grossly normal neurologically. Psych:  Alert and cooperative. Normal mood and affect.  Intake/Output from previous day: 12/14 0701 - 12/15 0700 In: 4233 [I.V.:1000; Blood:1083; IV NOTRRNHAF:7903] Out: 180 [Urine:180] Intake/Output this shift: No intake/output data recorded.  Lab Results:  Recent Labs  12/07/16 0940  12/07/16 2104 12/08/16 0238 12/08/16 0421  WBC 14.1*  --   --   --  13.7*  HGB 11.9*  < > 10.9* 10.1* 10.2*  HCT 35.3*  < > 32.4* 30.2* 30.6*  PLT 139*  --   --   --  110*  < > = values in this interval not displayed. BMET  Recent Labs  12/07/16 0940 12/07/16 1000 12/08/16 0421  NA 129* 133* 131*  K 3.9 4.1 3.8  CL 94*  95* 98*  CO2 25  --  23  GLUCOSE 116* 114* 113*  BUN 9 8 7   CREATININE 0.55* 0.50* 0.47*  CALCIUM 8.3*  --  7.9*   LFT  Recent Labs  12/07/16 0940 12/08/16 0421  PROT 7.4 6.9  ALBUMIN 2.3* 2.4*  AST 219* 186*  ALT 55 47  ALKPHOS 195* 176*  BILITOT 7.6* 7.1*   PT/INR  Recent Labs  12/07/16 0940 12/08/16 0421  LABPROT 47.9* 26.1*  INR 5.03* 2.34   Hepatitis Panel No results for input(s): HEPBSAG, HCVAB, HEPAIGM, HEPBIGM in the last 72 hours.   Studies/Results: No results found.  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 He presented with complaints of single episode of GI bleed with red rectal blood and 2 episodes of emesis and unquantifiable amount of blood. His abdomen has come more distended over the past 1-2 weeks and on exam is firm with a mild fluid wave likely ascites. His liver function seems to be decompensating with elevated bilirubin, alkaline phosphatase, AST, INR. Particularly concerning is his INR which was 5 on presentation. He is on novel anticoagulant Xarelto at home for portal vein thrombosis but not on Coumadin and therefore should have no effect on his INR.  While as in the emergency department room examining the patient he began retching  and proceeded to vomit 3-4 times total amount approximately 100-250 mL's, obvious red blood hematemesis. There are several possible sources of his bleeding including portal gastropathy, esophageal varices known, esophageal cancer.  Unfortunately, given his elevated INR he is not a candidate for upper endoscopy or paracentesis at this time, although depending on his progress he could be a candidate in a few days. He was given Vitamin K and FFP. He continues to drink up until at least 2 weeks ago.  Prognosis is  quite poor and I'm concerned about him given his worsening, widespread metastatic disease with decompensated liver cirrhosis, widespread hepatic metastasis, active bleeding. He is noted to be a DO NOT RESUSCITATE at this time. Palliative care on board.  His labs since yesterday afternoon showed decline in hgb: 11.4 -> 10.9 -> 10.1. INR improved to 2.34 this morning. CBC today shows elevated WBC 13.7, plt 110. CMP with sodium 131, BUN/Cr 7/0.47, AST/ALT 186/47, alk phos 176, Bili 7.1.   MELD: 78, Child Pugh: C based on yesterdays presentation labs.  Today he feels better and seems clinically improved. INR improved to 2.5. Last Xarelto about 12-24 hours ago, still not EGD candidate given anticoagulation and supratheraputic INR. Continued ascites with distended and firm abdomen, but not overtly symptomatic related to this other than continued chronic abdominal tenderness intermittently. Chart review shows Oncology has been notified of patient's condition.   MELD significantly elevated with yesterday's labs. Overall prognosis remains poor.   Plan: 1. Continue octreotide, protonix gtt, IV Rocephin for SBP prophylaxis 2. Monitor H/H closely 3. Monitor for recurrent GI bleed 4. Follow LFTs and INR 5. Continue supportive measures 6. Continue to hold Xarelto 7. May be candidate for EGD and/or paracentesis dependent on INR and clinical progression    Thank you for allowing [place] to participate in the care of [place], [place], [place] [facility] Nurse Practitioner [facility]    LOS: 1 day    12/08/2016, 7:25 AM

## 2016-12-08 NOTE — Care Management Note (Signed)
Case Management Note  Patient Details  Name: Joshua Greene MRN: [id number] Date of Birth: December 24, 1961  Subjective/Objective:   Patient adm with gastrointestinal hemorrhage, cancer of esophagus with liver metz. Patient state he lives with his fiance. He does not have insurance, will consult financial counselor.            Action/Plan: CM will follow for needs. Palliative consult pending.   Expected Discharge Date:       12/11/2016           Expected Discharge Plan:  Home w Hospice Care  In-House Referral:  Hospice / Palliative Care  Discharge planning Services  CM Consult  Post Acute Care Choice:    Choice offered to:     DME Arranged:    DME Agency:     HH Arranged:    HH Agency:     Status of Service:  In process, will continue to follow  If discussed at Long Length of Stay Meetings, dates discussed:    Additional Comments:  Smantha Boakye, Chauncey Reading, RN 12/08/2016, 5:04 PM

## 2016-12-08 NOTE — Progress Notes (Signed)
Initial Nutrition Assessment  DOCUMENTATION CODES:  Severe malnutrition in context of chronic illness   Pt meets criteria for SEVERE MALNUTRITION in the context of Chronic Illness as evidenced by Severe muscle/fat wasting and severe fluid accumulation.  INTERVENTION:  Boost Breeze po TID, each supplement provides 250 kcal and 9 grams of protein  Will order 30 mL Prostat BID, each supplement provides 100 kcal and 15 grams of protein.  NUTRITION DIAGNOSIS:  Increased nutrient needs related to catabolic illness, acute illness, cancer and cancer related treatments as evidenced by the estimated nutritional requirements for these conditions  GOAL:  Patient will meet greater than or equal to 90% of their needs   VS Patient comfort, if this route is pursued  MONITOR:  PO intake, Supplement acceptance, Diet advancement, Labs, Weight trends, Direction of Care  REASON FOR ASSESSMENT:  Malnutrition Screening Tool    ASSESSMENT:  55 y/o male PMHx Metastatic Esophageal Cancer, ETOH abuse, HTN, Asmtha, DM. Presents after experiencing hematemesis and bright red blood in stool. INR of 5. Admitted for workup of GIB  Pt is well known to this RD. Has been following periodically outpatient.   Pt had been nutritionally stable for the last few months. Had been eating 2-3 meals daily. He recently had been instructed to follow a DM diet due to development of diabetes. He was taking metformin.   He has slowly relapsed into increasing ETOH abuse. He initially felt so much better after starting chemo that he started having a beer each week and thought he could stick with that. ETOH intake ppears to have increased since then.   Patient today reports that he was at his baseline up until 1-2 days ago when he developed his bleeding. He had been subsisting mostly on Ensure prior to that. He notes that he had a large increase in appetite in the short term and says he "couldnt get full".  He has not eaten x 2 days.  Denies any ongoing n/v/c/d.   His weight measurements are not reliable given his moderate edema and increased ascites. Pt confirms the swelling has worsened.   For now, pt was agreeable to [employer]. Will also supplement with Prostat, given high protein needs.   Medications: IV abx, Lasix, PPI, Octreotide,  Labs: Hyponatremia, Albumin: 2.4   Recent Labs Lab 12/07/16 0940 12/07/16 1000 12/08/16 0421 12/08/16 0849  NA 129* 133* 131* 130*  K 3.9 4.1 3.8 3.9  CL 94* 95* 98* 97*  CO2 25  --  23 24  BUN '9 8 7 6  '$ CREATININE 0.55* 0.50* 0.47* 0.49*  CALCIUM 8.3*  --  7.9* 8.0*  GLUCOSE 116* 114* 113* 127*   Diet Order:  Diet clear liquid Room service appropriate? Yes; Fluid consistency: Thin  Skin:     Last BM:  12/14 (though patient reports it has been >3 days)  Height:  Ht Readings from Last 1 Encounters:  12/07/16 '6\' 2"'$  (1.88 m)   Weight:  Wt Readings from Last 1 Encounters:  12/07/16 165 lb (74.8 kg)   Wt Readings from Last 10 Encounters:  12/07/16 165 lb (74.8 kg)  12/05/16 172 lb 1.9 oz (78.1 kg)  11/23/16 165 lb 11.2 oz (75.2 kg)  11/20/16 167 lb (75.8 kg)  11/01/16 161 lb (73 kg)  10/18/16 156 lb 6.4 oz (70.9 kg)  10/04/16 156 lb 1.9 oz (70.8 kg)  10/04/16 154 lb (69.9 kg)  09/20/16 156 lb (70.8 kg)  09/15/16 154 lb (69.9 kg)   Ideal  Body Weight:  86.36 kg  BMI:  Body mass index is 21.18 kg/m.  Estimated Nutritional Needs:  Kcal:  2250-2450 kcals Protein:  115-130 g/Kg bw Fluid:  Per MD  EDUCATION NEEDS:  No education needs identified at this time  Burtis Junes RD, LDN, [place] [facility] Pager: [phone] 12/08/2016 12:22 PM

## 2016-12-08 NOTE — Progress Notes (Signed)
PROGRESS NOTE                                                                                                                                                                                                             Patient Demographics:    Joshua Greene, is a 55 y.o. male, DOB - April 12, 1961, UEA:[id number]  Admit date - 12/07/2016   Admitting Physician Thurnell Lose, MD  Outpatient Primary MD for the patient is Raylene Everts, MD  LOS - 1  Chief Complaint  Patient presents with  . GI Bleeding       Brief Narrative  Joshua Greene  is a 55 y.o. 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 In the ER H&H is stable, INR is close to 5, patient is hemodynamically stable and I was called to admit.   Subjective:    Joshua Greene has, No headache, No chest pain, No abdominal pain - No Nausea, No new weakness tingling or numbness, No Cough - SOB.   Assessment  & Plan :     1.GI bleed most likely upper. Further chart review reveals that patient has known history of esophageal recess due to portal hypertension. H&H is stable, we'll continue to monitor, type screening done, continue clear liquids, continue IV PPI and octreotide drip. GI on board. Empiric Rocephin for SBP prophylaxis. Xaralto held. INR around 2.5. We'll defer any paracentesis to GI.  2. 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 Overall prognosis is poor, for now supportive care, I have informed his oncologist. [name]. Palliative care will be involved. He wants to be DO NOT RESUSCITATE. If he declines full comfort care.  3. Remote history of alcohol abuse. Patient now confessed that he was drinking up to 2 weeks ago, currently no signs of DTs, will be monitored.  4. History of portal vein thrombosis with known esophageal varices and small to moderate ascites. Obviously xaralto  held. Supportive care for now. Rocephin for SBP prophylaxis due to GI bleed.    Family Communication  :  None  Code Status :  DNR  Diet : Diet clear liquid Room service appropriate? Yes; Fluid consistency: Thin   Disposition Plan  :  Step down  Consults  :  GI, Pall Care  Procedures  :    DVT Prophylaxis  :   SCDs    Lab Results  Component Value Date   PLT 110 (L) 12/08/2016   Lab Results  Component Value Date   INR 2.34 12/08/2016   INR 5.03 (HH) 12/07/2016   INR 1.35 05/29/2016     Inpatient Medications  Scheduled Meds: . cefTRIAXone (ROCEPHIN)  IV  1 g Intravenous Q24H  . feeding supplement  1 Container Oral TID BM  . furosemide  20 mg Oral Daily   Continuous Infusions: . octreotide  (SANDOSTATIN)    IV infusion 50 mcg/hr (12/08/16 0030)  . pantoprozole (PROTONIX) infusion 8 mg/hr (12/08/16 0030)   PRN Meds:.albuterol, HYDROcodone-acetaminophen, ondansetron **OR** ondansetron (ZOFRAN) IV  Antibiotics  :    Anti-infectives    Start     Dose/Rate Route Frequency Ordered Stop   12/08/16 1000  cefTRIAXone (ROCEPHIN) 1 g in dextrose 5 % 50 mL IVPB     1 g 100 mL/hr over 30 Minutes Intravenous Every 24 hours 12/07/16 1501 12/14/16 0959   12/07/16 0945  cefTRIAXone (ROCEPHIN) 1 g in dextrose 5 % 50 mL IVPB     1 g 100 mL/hr over 30 Minutes Intravenous  Once 12/07/16 0938 12/07/16 1157         Objective:   Vitals:   12/08/16 0115 12/08/16 0130 12/08/16 0700 12/08/16 0815  BP:   126/81   Pulse:   83   Resp:   11   Temp: 97.5 F (36.4 C) 98.1 F (36.7 C)  98.3 F (36.8 C)  TempSrc:  Oral Oral  Oral  SpO2:   98%   Weight:      Height:        Wt Readings from Last 3 Encounters:  12/07/16 74.8 kg (165 lb)  12/05/16 78.1 kg (172 lb 1.9 oz)  11/23/16 75.2 kg (165 lb 11.2 oz)     Intake/Output Summary (Last 24 hours) at 12/08/16 0841 Last data filed at 12/08/16 0130  Gross per 24 hour  Intake             4233 ml  Output              180 ml  Net             4053 ml     Physical Exam  Awake Alert, Oriented X 3, No new F.N deficits, Normal affect [place].AT,PERRAL Supple Neck,No JVD, No cervical lymphadenopathy appriciated.  Symmetrical Chest wall movement, Good air movement bilaterally, CTAB RRR,No Gallops,Rubs or new Murmurs, No Parasternal Heave +ve B.Sounds, Abd Soft with moderate ascites, No tenderness, No organomegaly appriciated, No rebound - guarding or rigidity. No Cyanosis, Clubbing or edema, No new Rash or bruise       Data Review:    CBC  Recent Labs Lab 12/07/16 0940 12/07/16 1000 12/07/16 1453 12/07/16 2104 12/08/16 0238 12/08/16 0421  WBC 14.1*  --   --   --   --  13.7*  HGB 11.9* 11.9* 11.4* 10.9* 10.1* 10.2*  HCT 35.3* 35.0* 34.4* 32.4* 30.2* 30.6*  PLT 139*  --   --   --   --  110*  MCV 96.2  --   --   --   --  97.1  MCH 32.4  --   --   --   --  32.4  MCHC 33.7  --   --   --   --  33.3  RDW 16.4*  --   --   --   --  16.8*  LYMPHSABS 1.6  --   --   --   --  1.8  MONOABS 2.2*  --   --   --   --  1.9*  EOSABS 0.1  --   --   --   --  0.1  BASOSABS 0.1  --   --   --   --  0.1    Chemistries   Recent Labs Lab 12/07/16 0940 12/07/16 1000 12/08/16 0421  NA 129* 133* 131*  K 3.9 4.1 3.8  CL 94* 95* 98*  CO2 25  --  23  GLUCOSE 116* 114* 113*  BUN '9 8 7  '$ CREATININE 0.55* 0.50* 0.47*  CALCIUM 8.3*  --  7.9*  AST 219*  --  186*  ALT 55  --  47  ALKPHOS 195*  --  176*  BILITOT 7.6*  --  7.1*   ------------------------------------------------------------------------------------------------------------------ No results for  input(s): CHOL, HDL, LDLCALC, TRIG, CHOLHDL, LDLDIRECT in the last 72 hours.  Lab Results  Component Value Date   HGBA1C 9.4 (H) 08/23/2016   ------------------------------------------------------------------------------------------------------------------ No results for input(s): TSH, T4TOTAL, T3FREE, THYROIDAB in the last 72 hours.  Invalid input(s): FREET3 ------------------------------------------------------------------------------------------------------------------ No results for input(s): VITAMINB12, FOLATE, FERRITIN, TIBC, IRON, RETICCTPCT in the last 72 hours.  Coagulation profile  Recent Labs Lab 12/07/16 0940 12/08/16 0421  INR 5.03* 2.34    No results for input(s): DDIMER in the last 72 hours.  Cardiac Enzymes No results for input(s): CKMB, TROPONINI, MYOGLOBIN in the last 168 hours.  Invalid input(s): CK ------------------------------------------------------------------------------------------------------------------    Component Value Date/Time   BNP 31.0 05/03/2016 2231    Micro Results Recent Results (from the past 240 hour(s))  MRSA PCR Screening     Status: None   Collection Time: 12/07/16  6:59 PM  Result Value Ref Range Status   MRSA by PCR NEGATIVE NEGATIVE Final    Comment:        The GeneXpert MRSA Assay (FDA approved for NASAL specimens only), is one component of a comprehensive MRSA colonization surveillance program. It is not intended to diagnose MRSA infection nor to guide or monitor treatment for MRSA infections.     Radiology Reports Ct Chest W Contrast  Result Date: 11/23/2016 CLINICAL DATA:  Distal esophageal cancer. Restaging. Currently on chemotherapy. Diabetes. Chronic bronchitis. Appendectomy. Alcohol abuse. EXAM: CT CHEST, ABDOMEN, AND PELVIS WITH CONTRAST TECHNIQUE: Multidetector CT imaging of the chest, abdomen and pelvis was performed following the standard protocol during bolus administration of intravenous contrast.  CONTRAST:  157m ISOVUE-300 IOPAMIDOL (ISOVUE-300) INJECTION 61% COMPARISON:  09/18/2016 FINDINGS: CT CHEST FINDINGS Cardiovascular: Right Port-A-Cath which terminates at the high right atrium. Normal heart size. Multivessel coronary artery atherosclerosis. No central pulmonary embolism, on this non-dedicated study. Esophageal varices, including on image 42/ series 2, similar. Mediastinum/Nodes: Mild left gynecomastia. No hilar adenopathy. A node within the azygoesophageal recess is enlarged, 1.5 cm on image 38/series 2. Mild distal esophageal wall thickening is similar. Lungs/Pleura: No pleural fluid. Mild centrilobular emphysema. Right apical pulmonary nodule is new at 6 mm on image 23/series 3. Posterior subpleural right upper lobe nodule  is increased at 5 mm Greene versus 4 mm on the prior. Example image 49/series 3. New pleural-based left upper lobe pulmonary nodule measures 5 mm on image 67/series 3. Lateral left upper lobe 3 mm pulmonary nodule is minimally increased on image 85/series 3. Musculoskeletal: Remote trauma involving right-sided ribs and medial clavicle. Similar spinous process sclerotic metastasis at T1. Left-sided sternal metastasis is also not significantly changed. CT ABDOMEN PELVIS FINDINGS Hepatobiliary: Cirrhotic appearance the liver, with an irregular capsule. Widespread, relatively diffuse hepatic metastasis. Progressive, but difficult to quantify secondary to relative diffuse appearance. Index lesion in the lateral segment left liver lobe measures 2.0 cm on image 54/series 2 versus 1.8 cm on the prior exam (when remeasured). A new or significantly enlarged lesion in the hepatic dome, compressing the intrahepatic IVC. Example at 3.4 cm on image 43/series 2. Mild gallbladder wall thickening, without calcified stone. Example image 65/series 2. No biliary duct dilatation. Pancreas: Pancreatic atrophy, without duct dilatation or definite acute inflammation. Spleen: Similar appearance of  multiple hypo attenuating splenic lesions. Example a 1.4 cm on image 50/series 2 versus 1.3 cm previously. Adrenals/Urinary Tract: Normal adrenal glands. Right lower pole renal lesion is too small to characterize. Normal left kidney, without hydronephrosis. Normal urinary bladder. Stomach/Bowel: Stomach is primarily underdistended. No gross abnormality identified. Suspect perirectal varices. Right-sided mild colonic wall thickening, including on image 81/series 2. This is slightly improved. Normal terminal ileum. Normal small bowel. Vascular/Lymphatic: Aortic and branch vessel atherosclerosis. Main portal vein and superior mesenteric vein are patent. Left portal vein is not well visualized and may be thrombosed, given appearance on ultrasound. There is a diminutive proximal right portal vein. Portosystemic collaterals including gastric varices. No abdominopelvic adenopathy. Reproductive: Normal prostate. Other: Slight increase in small to moderate volume abdominal pelvic ascites. Gas about the right anterior abdominal wall, including on image 90/series 2. Fluid containing periumbilical ventral wall hernia. Musculoskeletal: Remote trauma involving the left hemipelvis. IMPRESSION: 1. Mild progression of pulmonary metastasis. 2. Development of mediastinal adenopathy, consistent with nodal metastasis. 3. Progression of relatively diffuse hepatic metastasis. 4. Cirrhosis. Portal venous hypertension with esophageal, gastric, and perirectal varices. Suspicion of left portal vein chronic thrombosis. Main portal vein is opacified. 5. Increased abdominal pelvic ascites. 6. Decreased right-sided colonic wall thickening which may be related to portal venous hypertension. Infectious colitis cannot be excluded. 7. Similarly, gallbladder wall thickening is nonspecific in the setting of portal venous hypertension. 8. Similar splenic lesions which could represent metastasis or if patient is immunocompromised, fungal infection. 9.  Similar osseous metastasis. 10.  Coronary artery atherosclerosis. Aortic atherosclerosis. Electronically Signed   By: Abigail Miyamoto M.D.   On: 11/23/2016 09:57   Ct Abdomen Pelvis W Contrast  Result Date: 11/23/2016 CLINICAL DATA:  Distal esophageal cancer. Restaging. Currently on chemotherapy. Diabetes. Chronic bronchitis. Appendectomy. Alcohol abuse. EXAM: CT CHEST, ABDOMEN, AND PELVIS WITH CONTRAST TECHNIQUE: Multidetector CT imaging of the chest, abdomen and pelvis was performed following the standard protocol during bolus administration of intravenous contrast. CONTRAST:  173m ISOVUE-300 IOPAMIDOL (ISOVUE-300) INJECTION 61% COMPARISON:  09/18/2016 FINDINGS: CT CHEST FINDINGS Cardiovascular: Right Port-A-Cath which terminates at the high right atrium. Normal heart size. Multivessel coronary artery atherosclerosis. No central pulmonary embolism, on this non-dedicated study. Esophageal varices, including on image 42/ series 2, similar. Mediastinum/Nodes: Mild left gynecomastia. No hilar adenopathy. A node within the azygoesophageal recess is enlarged, 1.5 cm on image 38/series 2. Mild distal esophageal wall thickening is similar. Lungs/Pleura: No pleural fluid. Mild centrilobular emphysema. Right apical pulmonary  nodule is new at 6 mm on image 23/series 3. Posterior subpleural right upper lobe nodule is increased at 5 mm Greene versus 4 mm on the prior. Example image 49/series 3. New pleural-based left upper lobe pulmonary nodule measures 5 mm on image 67/series 3. Lateral left upper lobe 3 mm pulmonary nodule is minimally increased on image 85/series 3. Musculoskeletal: Remote trauma involving right-sided ribs and medial clavicle. Similar spinous process sclerotic metastasis at T1. Left-sided sternal metastasis is also not significantly changed. CT ABDOMEN PELVIS FINDINGS Hepatobiliary: Cirrhotic appearance the liver, with an irregular capsule. Widespread, relatively diffuse hepatic metastasis. Progressive,  but difficult to quantify secondary to relative diffuse appearance. Index lesion in the lateral segment left liver lobe measures 2.0 cm on image 54/series 2 versus 1.8 cm on the prior exam (when remeasured). A new or significantly enlarged lesion in the hepatic dome, compressing the intrahepatic IVC. Example at 3.4 cm on image 43/series 2. Mild gallbladder wall thickening, without calcified stone. Example image 65/series 2. No biliary duct dilatation. Pancreas: Pancreatic atrophy, without duct dilatation or definite acute inflammation. Spleen: Similar appearance of multiple hypo attenuating splenic lesions. Example a 1.4 cm on image 50/series 2 versus 1.3 cm previously. Adrenals/Urinary Tract: Normal adrenal glands. Right lower pole renal lesion is too small to characterize. Normal left kidney, without hydronephrosis. Normal urinary bladder. Stomach/Bowel: Stomach is primarily underdistended. No gross abnormality identified. Suspect perirectal varices. Right-sided mild colonic wall thickening, including on image 81/series 2. This is slightly improved. Normal terminal ileum. Normal small bowel. Vascular/Lymphatic: Aortic and branch vessel atherosclerosis. Main portal vein and superior mesenteric vein are patent. Left portal vein is not well visualized and may be thrombosed, given appearance on ultrasound. There is a diminutive proximal right portal vein. Portosystemic collaterals including gastric varices. No abdominopelvic adenopathy. Reproductive: Normal prostate. Other: Slight increase in small to moderate volume abdominal pelvic ascites. Gas about the right anterior abdominal wall, including on image 90/series 2. Fluid containing periumbilical ventral wall hernia. Musculoskeletal: Remote trauma involving the left hemipelvis. IMPRESSION: 1. Mild progression of pulmonary metastasis. 2. Development of mediastinal adenopathy, consistent with nodal metastasis. 3. Progression of relatively diffuse hepatic metastasis.  4. Cirrhosis. Portal venous hypertension with esophageal, gastric, and perirectal varices. Suspicion of left portal vein chronic thrombosis. Main portal vein is opacified. 5. Increased abdominal pelvic ascites. 6. Decreased right-sided colonic wall thickening which may be related to portal venous hypertension. Infectious colitis cannot be excluded. 7. Similarly, gallbladder wall thickening is nonspecific in the setting of portal venous hypertension. 8. Similar splenic lesions which could represent metastasis or if patient is immunocompromised, fungal infection. 9. Similar osseous metastasis. 10.  Coronary artery atherosclerosis. Aortic atherosclerosis. Electronically Signed   By: Abigail Miyamoto M.D.   On: 11/23/2016 09:57   US Abdomen Limited Ruq  Result Date: 11/20/2016 CLINICAL DATA:  Esophageal cancer, liver metastases, abdominal distension, worsening LFTs, asthma, hypertension, diabetes mellitus, ethanol abuse EXAM: US ABDOMEN LIMITED - RIGHT UPPER QUADRANT COMPARISON:  CT abdomen 09/18/2016 FINDINGS: Gallbladder: Contracted with thickened wall. No definite shadowing calculi or sonographic Murphy sign. Common bile duct: Diameter: 4 mm diameter, normal Liver: Heterogeneous appearance with innumerable nodular foci compatible with widespread metastatic disease. No blood flow identified within portal vein on color Doppler imaging compatible with portal vein thrombosis. Small amount of scattered ascites throughout abdomen. IMPRESSION: Widespread hepatic metastatic disease. Portal vein thrombosis. Minimal ascites. Findings called to Kirby Crigler PA on 11/20/2016 at 1033 hours. Electronically Signed   By: Crist Infante.D.  On: 11/20/2016 10:35    Time Spent in minutes  30   Hend Mccarrell K M.D on 12/08/2016 at 8:41 AM  Between 7am to 7pm - Pager - [phone]  After 7pm go to [email or web address] - password [facility]  [facility] -  Office  [phone]

## 2016-12-09 ENCOUNTER — Inpatient Hospital Stay (HOSPITAL_COMMUNITY): Payer: Medicaid Other

## 2016-12-09 ENCOUNTER — Ambulatory Visit (HOSPITAL_COMMUNITY)
Admission: EM | Admit: 2016-12-09 | Discharge: 2016-12-09 | Disposition: A | Discharge status: Home / Self Care | Payer: Medicaid Other | Attending: Internal Medicine | Admitting: Internal Medicine

## 2016-12-09 DIAGNOSIS — K922 Gastrointestinal hemorrhage, unspecified: Secondary | ICD-10-CM

## 2016-12-09 DIAGNOSIS — C155 Malignant neoplasm of lower third of esophagus: Secondary | ICD-10-CM

## 2016-12-09 DIAGNOSIS — R188 Other ascites: Secondary | ICD-10-CM

## 2016-12-09 DIAGNOSIS — K7031 Alcoholic cirrhosis of liver with ascites: Secondary | ICD-10-CM

## 2016-12-09 LAB — CBC
HEMATOCRIT: 31.4 % — AB (ref 39.0–52.0)
Hemoglobin: 10.3 g/dL — ABNORMAL LOW (ref 13.0–17.0)
MCH: 32.2 pg (ref 26.0–34.0)
MCHC: 32.8 g/dL (ref 30.0–36.0)
MCV: 98.1 fL (ref 78.0–100.0)
PLATELETS: 114 10*3/uL — AB (ref 150–400)
RBC: 3.2 MIL/uL — AB (ref 4.22–5.81)
RDW: 16.8 % — ABNORMAL HIGH (ref 11.5–15.5)
WBC: 12.9 10*3/uL — ABNORMAL HIGH (ref 4.0–10.5)

## 2016-12-09 LAB — PREPARE FRESH FROZEN PLASMA
BLOOD PRODUCT EXPIRATION DATE: 201712152146
BLOOD PRODUCT EXPIRATION DATE: 201712192359
Blood Product Expiration Date: 201712152307
Blood Product Expiration Date: 201712192359
ISSUE DATE / TIME: 201712141652
ISSUE DATE / TIME: 201712142103
ISSUE DATE / TIME: 201712142234
ISSUE DATE / TIME: 201712150023
UNIT TYPE AND RH: 5100
UNIT TYPE AND RH: 5100
Unit Type and Rh: 5100
Unit Type and Rh: 5100

## 2016-12-09 LAB — COMPREHENSIVE METABOLIC PANEL
ALT: 45 U/L (ref 17–63)
AST: 176 U/L — AB (ref 15–41)
Albumin: 2.4 g/dL — ABNORMAL LOW (ref 3.5–5.0)
Alkaline Phosphatase: 174 U/L — ABNORMAL HIGH (ref 38–126)
Anion gap: 7 (ref 5–15)
BILIRUBIN TOTAL: 8.2 mg/dL — AB (ref 0.3–1.2)
BUN: 6 mg/dL (ref 6–20)
CHLORIDE: 101 mmol/L (ref 101–111)
CO2: 25 mmol/L (ref 22–32)
CREATININE: 0.53 mg/dL — AB (ref 0.61–1.24)
Calcium: 7.8 mg/dL — ABNORMAL LOW (ref 8.9–10.3)
GFR calc Af Amer: >60 mL/min (ref >60)
Glucose, Bld: 106 mg/dL — ABNORMAL HIGH (ref 65–99)
Potassium: 3.5 mmol/L (ref 3.5–5.1)
Sodium: 133 mmol/L — ABNORMAL LOW (ref 135–145)
Total Protein: 7.2 g/dL (ref 6.5–8.1)

## 2016-12-09 MED ORDER — ALBUMIN HUMAN 25 % IV SOLN
50.0000 g | Freq: Once | INTRAVENOUS | Status: DC | PRN
  Filled 2016-12-09: qty 200

## 2016-12-09 NOTE — Progress Notes (Signed)
Patient returned from [facility] after having 2.4 liters of fluid removed. Placed in bed. Dr. Candiss Norse changed patients status to Med-Surg and will be transferred to 300 floor.

## 2016-12-09 NOTE — Progress Notes (Signed)
  Subjective:  Patient complains of abdominal distention. He denies chest pain or dysphagia with clear liquids. His stools were black yesterday. He has not had a bowel movement today. He denies nausea or vomiting.   Objective: Blood pressure 126/83, pulse 95, temperature 98.3 F (36.8 C), temperature source Oral, resp. rate 12, height '6\' 2"'$  (1.88 m), weight 165 lb (74.8 kg), SpO2 99 %. Patient is alert and in no acute distress. He has generalized muscle wasting. Abdomen is distended and tense without tenderness. He has small umbilical hernia. He has 2-3+ pitting edema involving both legs.  Labs/studies Results:   Recent Labs  12/07/16 0940  12/08/16 0421 12/08/16 1658 12/09/16 0441  WBC 14.1*  --  13.7*  --  12.9*  HGB 11.9*  < > 10.2* 10.6* 10.3*  HCT 35.3*  < > 30.6* 31.8* 31.4*  PLT 139*  --  110*  --  114*  < > = values in this interval not displayed.  BMET   Recent Labs  12/08/16 0421 12/08/16 0849 12/09/16 0807  NA 131* 130* 133*  K 3.8 3.9 3.5  CL 98* 97* 101  CO2 '23 24 25  '$ GLUCOSE 113* 127* 106*  BUN '7 6 6  '$ CREATININE 0.47* 0.49* 0.53*  CALCIUM 7.9* 8.0* 7.8*    LFT   Recent Labs  12/08/16 0421 12/08/16 0849 12/09/16 0807  PROT 6.9 7.3 7.2  ALBUMIN 2.4* 2.4* 2.4*  AST 186* 195* 176*  ALT 47 50 45  ALKPHOS 176* 174* 174*  BILITOT 7.1* 7.6* 8.2*    PT/INR   Recent Labs  12/07/16 0940 12/08/16 0421  LABPROT 47.9* 26.1*  INR 5.03* 2.34     Assessment:  #1. Upper GI bleed possibly secondary to esophageal varices but he could also have bled from known esophageal squamous cell carcinoma. Day 2 on octreotide infusion. Patient tolerating clear liquids. #2. Coagulopathy has partially corrected with 4 units of FFP. Coagulopathy most likely secondary to advanced liver disease. #3. Cirrhotic ascites. Dr. Candiss Norse has arranged for him to undergo ultrasound-guided abdominal tap today. #4. Metastatic squamous cell carcinoma the  esophagus.   Recommendations:  Patient not candidate for continued anticoagulation. Continue octreotide infusion for another 24 hours. Advance diet to full liquids. LVAP as planned.

## 2016-12-09 NOTE — Procedures (Signed)
Ultrasound-guided therapeutic paracentesis performed yielding 2.4 liters of yellow colored fluid. No immediate complications.  Joshua Greene E 2:21 PM 12/09/2016

## 2016-12-09 NOTE — Progress Notes (Signed)
PROGRESS NOTE                                                                                                                                                                                                             Patient Demographics:    Joshua Greene, is a 55 y.o. male, DOB - [birth year], ZOX:[id number]  Admit date - 12/07/2016   Admitting Physician Thurnell Lose, MD  Outpatient Primary MD for the patient is Raylene Everts, MD  LOS - 2  Chief Complaint  Patient presents with  . GI Bleeding       Brief Narrative  Joshua Greene  is a 55 y.o. 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 In the ER H&H is stable, INR is close to 5, patient is hemodynamically stable and I was called to admit.   Subjective:    [facility] today has, No headache, No chest pain, No abdominal pain - No Nausea, No new weakness tingling or numbness, No Cough - SOB.   Assessment  & Plan :     1.GI bleed most likely upper. Further chart review reveals that patient has known history of esophageal recess due to portal hypertension. H&H Continues to remain stable, we'll continue to monitor, type screening done, continue clear liquids, continue IV PPI and octreotide drip. GI on board. Empiric Rocephin for SBP prophylaxis. Xaralto held. INR around 2.5. Therapeutic paracentesis will be tried on 12/09/2016 with albumin IV overlap.  2. 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 Overall prognosis is poor, for  now supportive care, I have informed his oncologist. [name]. Palliative care will be involved. He wants to be DO NOT RESUSCITATE. If he declines full comfort care.  3. Remote history of alcohol abuse. Patient now confessed that he was drinking up to 2 weeks ago, currently no signs of DTs, will be monitored.  4. History of portal vein thrombosis with known esophageal varices  and moderate ascites. Obviously xaralto held. Supportive care for now. Rocephin for SBP prophylaxis due to GI bleed. We will try and get therapeutic paracentesis for fluid removal so that he can be more comfortable, he says his belly is getting more distended by the day.    Family Communication  :  None  Code Status :  DNR  Diet : Diet clear liquid Room service appropriate? Yes; Fluid consistency: Thin   Disposition Plan  :  MedSurg  Consults  :  GI, Pall Care  Procedures  :    DVT Prophylaxis  :   SCDs    Lab Results  Component Value Date   PLT 114 (L) 12/09/2016   Lab Results  Component Value Date   INR 2.34 12/08/2016   INR 5.03 (HH) 12/07/2016   INR 1.35 05/29/2016     Inpatient Medications  Scheduled Meds: . bisacodyl  10 mg Rectal Daily  . cefTRIAXone (ROCEPHIN)  IV  1 g Intravenous Q24H  . docusate sodium  200 mg Oral BID  . feeding supplement  1 Container Oral TID BM  . feeding supplement (PRO-STAT SUGAR FREE 64)  30 mL Oral BID  . furosemide  20 mg Oral Daily  . sorbitol, milk of mag, mineral oil, glycerin (SMOG) enema  960 mL Rectal Once   Continuous Infusions: . octreotide  (SANDOSTATIN)    IV infusion 50 mcg/hr (12/09/16 0943)  . pantoprozole (PROTONIX) infusion 8 mg/hr (12/09/16 0943)   PRN Meds:.albumin human, albuterol, HYDROcodone-acetaminophen, ondansetron **OR** ondansetron (ZOFRAN) IV  Antibiotics  :    Anti-infectives    Start     Dose/Rate Route Frequency Ordered Stop   12/08/16 1000  cefTRIAXone (ROCEPHIN) 1 g in dextrose 5 % 50 mL IVPB     1 g 100 mL/hr over 30  Minutes Intravenous Every 24 hours 12/07/16 1501 12/14/16 0959   12/07/16 0945  cefTRIAXone (ROCEPHIN) 1 g in dextrose 5 % 50 mL IVPB     1 g 100 mL/hr over 30 Minutes Intravenous  Once 12/07/16 0938 12/07/16 1157         Objective:   Vitals:   12/09/16 0600 12/09/16 0700 12/09/16 0800 12/09/16 0900  BP: (!) 115/94 128/83 128/85 114/84  Pulse: (!) 107 94 90 93  Resp:      Temp:   98.3 F (36.8 C)   TempSrc:   Oral   SpO2: 93% 98% 97% 96%  Weight:      Height:        Wt Readings from Last 3 Encounters:  12/07/16 74.8 kg (165 lb)  12/05/16 78.1 kg (172 lb 1.9 oz)  11/23/16 75.2 kg (165 lb 11.2 oz)     Intake/Output Summary (Last 24 hours) at 12/09/16 1006 Last data filed at 12/09/16 0900  Gross per 24 hour  Intake          1779.17 ml  Output              500 ml  Net          1279.17 ml     Physical Exam  Awake Alert, Oriented X 3, No new F.N deficits, Normal affect [place].AT,PERRAL Supple Neck,No JVD, No cervical lymphadenopathy appriciated.  Symmetrical Chest wall movement, Good air movement bilaterally, CTAB RRR,No Gallops,Rubs or new Murmurs, No Parasternal Heave +ve B.Sounds, Abd Soft with ++ ascites, No tenderness, No organomegaly appriciated, No rebound - guarding or rigidity. No Cyanosis, Clubbing or edema, No new Rash or bruise  Data Review:    CBC  Recent Labs Lab 12/07/16 0940  12/07/16 2104 12/08/16 0238 12/08/16 0421 12/08/16 1658 12/09/16 0441  WBC 14.1*  --   --   --  13.7*  --  12.9*  HGB 11.9*  < > 10.9* 10.1* 10.2* 10.6* 10.3*  HCT 35.3*  < > 32.4* 30.2* 30.6* 31.8* 31.4*  PLT 139*  --   --   --  110*  --  114*  MCV 96.2  --   --   --  97.1  --  98.1  MCH 32.4  --   --   --  32.4  --  32.2  MCHC 33.7  --   --   --  33.3  --  32.8  RDW 16.4*  --   --   --  16.8*  --  16.8*  LYMPHSABS 1.6  --   --   --  1.8  --   --   MONOABS 2.2*  --   --   --  1.9*  --   --   EOSABS 0.1  --   --   --  0.1  --   --   BASOSABS 0.1  --   --   --   0.1  --   --   < > = values in this interval not displayed.  Chemistries   Recent Labs Lab 12/07/16 0940 12/07/16 1000 12/08/16 0421 12/08/16 0849 12/09/16 0807  NA 129* 133* 131* 130* 133*  K 3.9 4.1 3.8 3.9 3.5  CL 94* 95* 98* 97* 101  CO2 25  --  '23 24 25  '$ GLUCOSE 116* 114* 113* 127* 106*  BUN '9 8 7 6 6  '$ CREATININE 0.55* 0.50* 0.47* 0.49* 0.53*  CALCIUM 8.3*  --  7.9* 8.0* 7.8*  AST 219*  --  186* 195* 176*  ALT 55  --  47 50 45  ALKPHOS 195*  --  176* 174* 174*  BILITOT 7.6*  --  7.1* 7.6* 8.2*   ------------------------------------------------------------------------------------------------------------------ No results for input(s): CHOL, HDL, LDLCALC, TRIG, CHOLHDL, LDLDIRECT in the last 72 hours.  Lab Results  Component Value Date   HGBA1C 9.4 (H) 08/23/2016   ------------------------------------------------------------------------------------------------------------------ No results for input(s): TSH, T4TOTAL, T3FREE, THYROIDAB in the last 72 hours.  Invalid input(s): FREET3 ------------------------------------------------------------------------------------------------------------------ No results for input(s): VITAMINB12, FOLATE, FERRITIN, TIBC, IRON, RETICCTPCT in the last 72 hours.  Coagulation profile  Recent Labs Lab 12/07/16 0940 12/08/16 0421  INR 5.03* 2.34    No results for input(s): DDIMER in the last 72 hours.  Cardiac Enzymes No results for input(s): CKMB, TROPONINI, MYOGLOBIN in the last 168 hours.  Invalid input(s): CK ------------------------------------------------------------------------------------------------------------------    Component Value Date/Time   BNP 31.0 05/03/2016 2231    Micro Results Recent Results (from the past 240 hour(s))  MRSA PCR Screening     Status: None   Collection Time: 12/07/16  6:59 PM  Result Value Ref Range Status   MRSA by PCR NEGATIVE NEGATIVE Final    Comment:        The GeneXpert MRSA  Assay (FDA approved for NASAL specimens only), is one component of a comprehensive MRSA colonization surveillance program. It is not intended to diagnose MRSA infection nor to guide or monitor treatment for MRSA infections.     Radiology Reports Ct Chest W Contrast  Result Date: 11/23/2016 CLINICAL DATA:  Distal esophageal cancer. Restaging. Currently on chemotherapy. Diabetes. Chronic bronchitis. Appendectomy. Alcohol abuse. EXAM: CT CHEST,  ABDOMEN, AND PELVIS WITH CONTRAST TECHNIQUE: Multidetector CT imaging of the chest, abdomen and pelvis was performed following the standard protocol during bolus administration of intravenous contrast. CONTRAST:  149m ISOVUE-300 IOPAMIDOL (ISOVUE-300) INJECTION 61% COMPARISON:  09/18/2016 FINDINGS: CT CHEST FINDINGS Cardiovascular: Right Port-A-Cath which terminates at the high right atrium. Normal heart size. Multivessel coronary artery atherosclerosis. No central pulmonary embolism, on this non-dedicated study. Esophageal varices, including on image 42/ series 2, similar. Mediastinum/Nodes: Mild left gynecomastia. No hilar adenopathy. A node within the azygoesophageal recess is enlarged, 1.5 cm on image 38/series 2. Mild distal esophageal wall thickening is similar. Lungs/Pleura: No pleural fluid. Mild centrilobular emphysema. Right apical pulmonary nodule is new at 6 mm on image 23/series 3. Posterior subpleural right upper lobe nodule is increased at 5 mm today versus 4 mm on the prior. Example image 49/series 3. New pleural-based left upper lobe pulmonary nodule measures 5 mm on image 67/series 3. Lateral left upper lobe 3 mm pulmonary nodule is minimally increased on image 85/series 3. Musculoskeletal: Remote trauma involving right-sided ribs and medial clavicle. Similar spinous process sclerotic metastasis at T1. Left-sided sternal metastasis is also not significantly changed. CT ABDOMEN PELVIS FINDINGS Hepatobiliary: Cirrhotic appearance the liver, with  an irregular capsule. Widespread, relatively diffuse hepatic metastasis. Progressive, but difficult to quantify secondary to relative diffuse appearance. Index lesion in the lateral segment left liver lobe measures 2.0 cm on image 54/series 2 versus 1.8 cm on the prior exam (when remeasured). A new or significantly enlarged lesion in the hepatic dome, compressing the intrahepatic IVC. Example at 3.4 cm on image 43/series 2. Mild gallbladder wall thickening, without calcified stone. Example image 65/series 2. No biliary duct dilatation. Pancreas: Pancreatic atrophy, without duct dilatation or definite acute inflammation. Spleen: Similar appearance of multiple hypo attenuating splenic lesions. Example a 1.4 cm on image 50/series 2 versus 1.3 cm previously. Adrenals/Urinary Tract: Normal adrenal glands. Right lower pole renal lesion is too small to characterize. Normal left kidney, without hydronephrosis. Normal urinary bladder. Stomach/Bowel: Stomach is primarily underdistended. No gross abnormality identified. Suspect perirectal varices. Right-sided mild colonic wall thickening, including on image 81/series 2. This is slightly improved. Normal terminal ileum. Normal small bowel. Vascular/Lymphatic: Aortic and branch vessel atherosclerosis. Main portal vein and superior mesenteric vein are patent. Left portal vein is not well visualized and may be thrombosed, given appearance on ultrasound. There is a diminutive proximal right portal vein. Portosystemic collaterals including gastric varices. No abdominopelvic adenopathy. Reproductive: Normal prostate. Other: Slight increase in small to moderate volume abdominal pelvic ascites. Gas about the right anterior abdominal wall, including on image 90/series 2. Fluid containing periumbilical ventral wall hernia. Musculoskeletal: Remote trauma involving the left hemipelvis. IMPRESSION: 1. Mild progression of pulmonary metastasis. 2. Development of mediastinal adenopathy,  consistent with nodal metastasis. 3. Progression of relatively diffuse hepatic metastasis. 4. Cirrhosis. Portal venous hypertension with esophageal, gastric, and perirectal varices. Suspicion of left portal vein chronic thrombosis. Main portal vein is opacified. 5. Increased abdominal pelvic ascites. 6. Decreased right-sided colonic wall thickening which may be related to portal venous hypertension. Infectious colitis cannot be excluded. 7. Similarly, gallbladder wall thickening is nonspecific in the setting of portal venous hypertension. 8. Similar splenic lesions which could represent metastasis or if patient is immunocompromised, fungal infection. 9. Similar osseous metastasis. 10.  Coronary artery atherosclerosis. Aortic atherosclerosis. Electronically Signed   By: KAbigail MiyamotoM.D.   On: 11/23/2016 09:57   Ct Abdomen Pelvis W Contrast  Result Date: 11/23/2016 CLINICAL DATA:  Distal esophageal cancer. Restaging. Currently on chemotherapy. Diabetes. Chronic bronchitis. Appendectomy. Alcohol abuse. EXAM: CT CHEST, ABDOMEN, AND PELVIS WITH CONTRAST TECHNIQUE: Multidetector CT imaging of the chest, abdomen and pelvis was performed following the standard protocol during bolus administration of intravenous contrast. CONTRAST:  160m ISOVUE-300 IOPAMIDOL (ISOVUE-300) INJECTION 61% COMPARISON:  09/18/2016 FINDINGS: CT CHEST FINDINGS Cardiovascular: Right Port-A-Cath which terminates at the high right atrium. Normal heart size. Multivessel coronary artery atherosclerosis. No central pulmonary embolism, on this non-dedicated study. Esophageal varices, including on image 42/ series 2, similar. Mediastinum/Nodes: Mild left gynecomastia. No hilar adenopathy. A node within the azygoesophageal recess is enlarged, 1.5 cm on image 38/series 2. Mild distal esophageal wall thickening is similar. Lungs/Pleura: No pleural fluid. Mild centrilobular emphysema. Right apical pulmonary nodule is new at 6 mm on image 23/series 3.  Posterior subpleural right upper lobe nodule is increased at 5 mm today versus 4 mm on the prior. Example image 49/series 3. New pleural-based left upper lobe pulmonary nodule measures 5 mm on image 67/series 3. Lateral left upper lobe 3 mm pulmonary nodule is minimally increased on image 85/series 3. Musculoskeletal: Remote trauma involving right-sided ribs and medial clavicle. Similar spinous process sclerotic metastasis at T1. Left-sided sternal metastasis is also not significantly changed. CT ABDOMEN PELVIS FINDINGS Hepatobiliary: Cirrhotic appearance the liver, with an irregular capsule. Widespread, relatively diffuse hepatic metastasis. Progressive, but difficult to quantify secondary to relative diffuse appearance. Index lesion in the lateral segment left liver lobe measures 2.0 cm on image 54/series 2 versus 1.8 cm on the prior exam (when remeasured). A new or significantly enlarged lesion in the hepatic dome, compressing the intrahepatic IVC. Example at 3.4 cm on image 43/series 2. Mild gallbladder wall thickening, without calcified stone. Example image 65/series 2. No biliary duct dilatation. Pancreas: Pancreatic atrophy, without duct dilatation or definite acute inflammation. Spleen: Similar appearance of multiple hypo attenuating splenic lesions. Example a 1.4 cm on image 50/series 2 versus 1.3 cm previously. Adrenals/Urinary Tract: Normal adrenal glands. Right lower pole renal lesion is too small to characterize. Normal left kidney, without hydronephrosis. Normal urinary bladder. Stomach/Bowel: Stomach is primarily underdistended. No gross abnormality identified. Suspect perirectal varices. Right-sided mild colonic wall thickening, including on image 81/series 2. This is slightly improved. Normal terminal ileum. Normal small bowel. Vascular/Lymphatic: Aortic and branch vessel atherosclerosis. Main portal vein and superior mesenteric vein are patent. Left portal vein is not well visualized and may be  thrombosed, given appearance on ultrasound. There is a diminutive proximal right portal vein. Portosystemic collaterals including gastric varices. No abdominopelvic adenopathy. Reproductive: Normal prostate. Other: Slight increase in small to moderate volume abdominal pelvic ascites. Gas about the right anterior abdominal wall, including on image 90/series 2. Fluid containing periumbilical ventral wall hernia. Musculoskeletal: Remote trauma involving the left hemipelvis. IMPRESSION: 1. Mild progression of pulmonary metastasis. 2. Development of mediastinal adenopathy, consistent with nodal metastasis. 3. Progression of relatively diffuse hepatic metastasis. 4. Cirrhosis. Portal venous hypertension with esophageal, gastric, and perirectal varices. Suspicion of left portal vein chronic thrombosis. Main portal vein is opacified. 5. Increased abdominal pelvic ascites. 6. Decreased right-sided colonic wall thickening which may be related to portal venous hypertension. Infectious colitis cannot be excluded. 7. Similarly, gallbladder wall thickening is nonspecific in the setting of portal venous hypertension. 8. Similar splenic lesions which could represent metastasis or if patient is immunocompromised, fungal infection. 9. Similar osseous metastasis. 10.  Coronary artery atherosclerosis. Aortic atherosclerosis. Electronically Signed   By: KAbigail MiyamotoM.D.   On:  11/23/2016 09:57   Dg Abd Portable 1v  Result Date: 12/08/2016 CLINICAL DATA:  Abdominal pain.  History of esophageal carcinoma EXAM: PORTABLE ABDOMEN - 1 VIEW COMPARISON:  CT abdomen pelvis November 22, 2016 FINDINGS: There is no bowel dilatation or air-fluid levels suggesting bowel obstruction. No free air. There are coils in the pelvic region on the left. A somewhat hazy appearance of the abdomen raises question of ascites. IMPRESSION: No bowel obstruction or free air evident.  Question ascites. Electronically Signed   By: Lowella Grip III M.D.   On:  12/08/2016 13:26   US Abdomen Limited Ruq  Result Date: 11/20/2016 CLINICAL DATA:  Esophageal cancer, liver metastases, abdominal distension, worsening LFTs, asthma, hypertension, diabetes mellitus, ethanol abuse EXAM: US ABDOMEN LIMITED - RIGHT UPPER QUADRANT COMPARISON:  CT abdomen 09/18/2016 FINDINGS: Gallbladder: Contracted with thickened wall. No definite shadowing calculi or sonographic Murphy sign. Common bile duct: Diameter: 4 mm diameter, normal Liver: Heterogeneous appearance with innumerable nodular foci compatible with widespread metastatic disease. No blood flow identified within portal vein on color Doppler imaging compatible with portal vein thrombosis. Small amount of scattered ascites throughout abdomen. IMPRESSION: Widespread hepatic metastatic disease. Portal vein thrombosis. Minimal ascites. Findings called to Kirby Crigler PA on 11/20/2016 at 1033 hours. Electronically Signed   By: Lavonia Dana M.D.   On: 11/20/2016 10:35    Time Spent in minutes  30   Saafir Abdullah K M.D on 12/09/2016 at 10:06 AM  Between 7am to 7pm - Pager - [phone]  After 7pm go to [email or web address] - password [email or web address]  [facility] -  Office  [phone]

## 2016-12-09 NOTE — Progress Notes (Signed)
Called report to Radiology at [facility] called and patient transported at 1200 for procedure at [facility].

## 2016-12-10 LAB — CBC
HEMATOCRIT: 30.9 % — AB (ref 39.0–52.0)
Hemoglobin: 10.2 g/dL — ABNORMAL LOW (ref 13.0–17.0)
MCH: 32.1 pg (ref 26.0–34.0)
MCHC: 33 g/dL (ref 30.0–36.0)
MCV: 97.2 fL (ref 78.0–100.0)
PLATELETS: 118 10*3/uL — AB (ref 150–400)
RBC: 3.18 MIL/uL — AB (ref 4.22–5.81)
RDW: 16.9 % — AB (ref 11.5–15.5)
WBC: 12.4 10*3/uL — AB (ref 4.0–10.5)

## 2016-12-10 LAB — URINALYSIS, ROUTINE W REFLEX MICROSCOPIC
Glucose, UA: NEGATIVE mg/dL
Hgb urine dipstick: NEGATIVE
Ketones, ur: NEGATIVE mg/dL
Leukocytes, UA: NEGATIVE
NITRITE: NEGATIVE
PROTEIN: NEGATIVE mg/dL
SPECIFIC GRAVITY, URINE: 1.017 (ref 1.005–1.030)
pH: 5 (ref 5.0–8.0)

## 2016-12-10 LAB — PROTIME-INR
INR: 2.03
Prothrombin Time: 23.3 seconds — ABNORMAL HIGH (ref 11.4–15.2)

## 2016-12-10 LAB — SODIUM, URINE, RANDOM

## 2016-12-10 LAB — OSMOLALITY, URINE: OSMOLALITY UR: 499 mosm/kg (ref 300–900)

## 2016-12-10 MED ORDER — SODIUM CHLORIDE 0.9 % IV SOLN
8.0000 mg/h | INTRAVENOUS | Status: DC
  Administered 2016-12-10 – 2016-12-11 (×2): 8 mg/h via INTRAVENOUS
  Filled 2016-12-10 (×3): qty 80

## 2016-12-10 MED ORDER — PANTOPRAZOLE SODIUM 40 MG IV SOLR
INTRAVENOUS | Status: AC
  Filled 2016-12-10: qty 80

## 2016-12-10 NOTE — Progress Notes (Signed)
PROGRESS NOTE                                                                                                                                                                                                             Patient Demographics:    Joshua Greene, is a 55 y.o. male, DOB - Aug 08, 1961, EUM:[id number]  Admit date - 12/07/2016   Admitting Physician Thurnell Lose, MD  Outpatient Primary MD for the patient is Raylene Everts, MD  LOS - 3  Chief Complaint  Patient presents with  . GI Bleeding       Brief Narrative  Joshua Greene  is a 55 y.o. 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 He has advanced metastatic cancer chose to be DO NOT RESUSCITATE, currently on IV PPI and octreotide drip with stable H&H, plan is to discharge tomorrow once he is off octreotide drip with most likely home hospice. Palliative care to reevaluate tomorrow.   Subjective:    [name] today has, No headache, No chest pain, No abdominal pain - No Nausea, No new weakness tingling or numbness, No Cough - SOB.   Assessment  & Plan :     1.GI bleed most likely upper. Further chart review reveals that patient has known history of esophageal Varices due to portal hypertension. H&H Continues to remain stable, tinea to monitor H&H, type screening done, continue clear liquids, continue IV PPI and octreotide drip. GI on board. Empiric Rocephin for SBP prophylaxis. Xaralto held. INR around 2.5. Therapeutic paracentesis done under ultrasound guidance on 12/09/2016 with albumin IV overlap, 2 L of fluid removed. Patient much better. Octreotide drip likely to be discontinued today. We will  have him discuss his outpatient care with palliative care for goals of care tomorrow and likely discharge home with home hospice in place.  2. 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 Overall prognosis is poor, for now supportive care, I have informed his oncologist. [name]. Palliative care will be involved. He wants to be DO NOT RESUSCITATE. If he declines full comfort care.  3. Remote history of  alcohol abuse. Patient now confessed that he was drinking up to 2 weeks ago, currently no signs of DTs, will be monitored.  4. History of portal vein thrombosis with known esophageal varices and moderate ascites. Obviously xaralto held. Supportive care for now. Rocephin for SBP prophylaxis due to GI bleed. Therapy did paracentesis done. Patient accepts not being on anticoagulation due to the risk of rebleeding. He understands that goal of care now will be comfort.    Family Communication  :  None  Code Status :  DNR  Diet : Diet clear liquid Room service appropriate? Yes; Fluid consistency: Thin   Disposition Plan  :  Likely home in the morning with home hospice  Consults  :  GI, Pall Care  Procedures  :    Ultrasound-guided therapeutic only paracentesis with 2 L of fluid removed.  DVT Prophylaxis  :   SCDs    Lab Results  Component Value Date   PLT 118 (L) 12/10/2016   Lab Results  Component Value Date   INR 2.03 12/10/2016   INR 2.34 12/08/2016   INR 5.03 (HH) 12/07/2016     Inpatient Medications  Scheduled Meds: . bisacodyl  10 mg Rectal Daily  . cefTRIAXone (ROCEPHIN)  IV  1 g Intravenous Q24H  . docusate sodium  200 mg Oral BID  . feeding supplement  1 Container Oral TID BM  . feeding supplement (PRO-STAT SUGAR FREE 64)  30 mL Oral BID  . furosemide  20 mg Oral Daily  . sorbitol, milk of mag, mineral oil, glycerin (SMOG) enema  960 mL Rectal Once   Continuous Infusions: . octreotide  (SANDOSTATIN)    IV infusion  50 mcg/hr (12/10/16 0714)  . pantoprozole (PROTONIX) infusion 8 mg/hr (12/10/16 0714)   PRN Meds:.albumin human, albuterol, HYDROcodone-acetaminophen, ondansetron **OR** ondansetron (ZOFRAN) IV  Antibiotics  :    Anti-infectives    Start     Dose/Rate Route Frequency Ordered Stop   12/08/16 1000  cefTRIAXone (ROCEPHIN) 1 g in dextrose 5 % 50 mL IVPB     1 g 100 mL/hr over 30 Minutes Intravenous Every 24 hours 12/07/16 1501 12/14/16 0959   12/07/16 0945  cefTRIAXone (ROCEPHIN) 1 g in dextrose 5 % 50 mL IVPB     1 g 100 mL/hr over 30 Minutes Intravenous  Once 12/07/16 0938 12/07/16 1157         Objective:   Vitals:   12/09/16 1406 12/09/16 1807 12/09/16 2224 12/10/16 0500  BP: 121/81 123/85 114/65 116/65  Pulse:   96 95  Resp:   16 18  Temp:   98.4 F (36.9 C) 98 F (36.7 C)  TempSrc:   Oral Oral  SpO2:   99% 96%  Weight:    80.2 kg (176 lb 11.2 oz)  Height:        Wt Readings from Last 3 Encounters:  12/10/16 80.2 kg (176 lb 11.2 oz)  12/05/16 78.1 kg (172 lb 1.9 oz)  11/23/16 75.2 kg (165 lb 11.2 oz)     Intake/Output Summary (Last 24 hours) at 12/10/16 0910 Last data filed at 12/10/16 0600  Gross per 24 hour  Intake          1020.84 ml  Output              800 ml  Net           220.84 ml     Physical Exam  Awake Alert, Oriented X 3, No new F.N  deficits, Normal affect [place].AT,PERRAL Supple Neck,No JVD, No cervical lymphadenopathy appriciated.  Symmetrical Chest wall movement, Good air movement bilaterally, CTAB RRR,No Gallops,Rubs or new Murmurs, No Parasternal Heave +ve B.Sounds, Abd Soft with ++ ascites, No tenderness, No organomegaly appriciated, No rebound - guarding or rigidity. No Cyanosis, Clubbing or edema, No new Rash or bruise       Data Review:    CBC  Recent Labs Lab 12/07/16 0940  12/08/16 0238 12/08/16 0421 12/08/16 1658 12/09/16 0441 12/10/16 0445  WBC 14.1*  --   --  13.7*  --  12.9* 12.4*  HGB 11.9*  < > 10.1* 10.2* 10.6*  10.3* 10.2*  HCT 35.3*  < > 30.2* 30.6* 31.8* 31.4* 30.9*  PLT 139*  --   --  110*  --  114* 118*  MCV 96.2  --   --  97.1  --  98.1 97.2  MCH 32.4  --   --  32.4  --  32.2 32.1  MCHC 33.7  --   --  33.3  --  32.8 33.0  RDW 16.4*  --   --  16.8*  --  16.8* 16.9*  LYMPHSABS 1.6  --   --  1.8  --   --   --   MONOABS 2.2*  --   --  1.9*  --   --   --   EOSABS 0.1  --   --  0.1  --   --   --   BASOSABS 0.1  --   --  0.1  --   --   --   < > = values in this interval not displayed.  Chemistries   Recent Labs Lab 12/07/16 0940 12/07/16 1000 12/08/16 0421 12/08/16 0849 12/09/16 0807  NA 129* 133* 131* 130* 133*  K 3.9 4.1 3.8 3.9 3.5  CL 94* 95* 98* 97* 101  CO2 25  --  '23 24 25  '$ GLUCOSE 116* 114* 113* 127* 106*  BUN '9 8 7 6 6  '$ CREATININE 0.55* 0.50* 0.47* 0.49* 0.53*  CALCIUM 8.3*  --  7.9* 8.0* 7.8*  AST 219*  --  186* 195* 176*  ALT 55  --  47 50 45  ALKPHOS 195*  --  176* 174* 174*  BILITOT 7.6*  --  7.1* 7.6* 8.2*   ------------------------------------------------------------------------------------------------------------------ No results for input(s): CHOL, HDL, LDLCALC, TRIG, CHOLHDL, LDLDIRECT in the last 72 hours.  Lab Results  Component Value Date   HGBA1C 9.4 (H) 08/23/2016   ------------------------------------------------------------------------------------------------------------------ No results for input(s): TSH, T4TOTAL, T3FREE, THYROIDAB in the last 72 hours.  Invalid input(s): FREET3 ------------------------------------------------------------------------------------------------------------------ No results for input(s): VITAMINB12, FOLATE, FERRITIN, TIBC, IRON, RETICCTPCT in the last 72 hours.  Coagulation profile  Recent Labs Lab 12/07/16 0940 12/08/16 0421 12/10/16 0445  INR 5.03* 2.34 2.03    No results for input(s): DDIMER in the last 72 hours.  Cardiac Enzymes No results for input(s): CKMB, TROPONINI, MYOGLOBIN in the last 168  hours.  Invalid input(s): CK ------------------------------------------------------------------------------------------------------------------    Component Value Date/Time   BNP 31.0 05/03/2016 2231    Micro Results Recent Results (from the past 240 hour(s))  MRSA PCR Screening     Status: None   Collection Time: 12/07/16  6:59 PM  Result Value Ref Range Status   MRSA by PCR NEGATIVE NEGATIVE Final    Comment:        The GeneXpert MRSA Assay (FDA approved for NASAL specimens only), is one component of a comprehensive  MRSA colonization surveillance program. It is not intended to diagnose MRSA infection nor to guide or monitor treatment for MRSA infections.     Radiology Reports Ct Chest W Contrast  Result Date: 11/23/2016 CLINICAL DATA:  Distal esophageal cancer. Restaging. Currently on chemotherapy. Diabetes. Chronic bronchitis. Appendectomy. Alcohol abuse. EXAM: CT CHEST, ABDOMEN, AND PELVIS WITH CONTRAST TECHNIQUE: Multidetector CT imaging of the chest, abdomen and pelvis was performed following the standard protocol during bolus administration of intravenous contrast. CONTRAST:  123m ISOVUE-300 IOPAMIDOL (ISOVUE-300) INJECTION 61% COMPARISON:  09/18/2016 FINDINGS: CT CHEST FINDINGS Cardiovascular: Right Port-A-Cath which terminates at the high right atrium. Normal heart size. Multivessel coronary artery atherosclerosis. No central pulmonary embolism, on this non-dedicated study. Esophageal varices, including on image 42/ series 2, similar. Mediastinum/Nodes: Mild left gynecomastia. No hilar adenopathy. A node within the azygoesophageal recess is enlarged, 1.5 cm on image 38/series 2. Mild distal esophageal wall thickening is similar. Lungs/Pleura: No pleural fluid. Mild centrilobular emphysema. Right apical pulmonary nodule is new at 6 mm on image 23/series 3. Posterior subpleural right upper lobe nodule is increased at 5 mm today versus 4 mm on the prior. Example image 49/series  3. New pleural-based left upper lobe pulmonary nodule measures 5 mm on image 67/series 3. Lateral left upper lobe 3 mm pulmonary nodule is minimally increased on image 85/series 3. Musculoskeletal: Remote trauma involving right-sided ribs and medial clavicle. Similar spinous process sclerotic metastasis at T1. Left-sided sternal metastasis is also not significantly changed. CT ABDOMEN PELVIS FINDINGS Hepatobiliary: Cirrhotic appearance the liver, with an irregular capsule. Widespread, relatively diffuse hepatic metastasis. Progressive, but difficult to quantify secondary to relative diffuse appearance. Index lesion in the lateral segment left liver lobe measures 2.0 cm on image 54/series 2 versus 1.8 cm on the prior exam (when remeasured). A new or significantly enlarged lesion in the hepatic dome, compressing the intrahepatic IVC. Example at 3.4 cm on image 43/series 2. Mild gallbladder wall thickening, without calcified stone. Example image 65/series 2. No biliary duct dilatation. Pancreas: Pancreatic atrophy, without duct dilatation or definite acute inflammation. Spleen: Similar appearance of multiple hypo attenuating splenic lesions. Example a 1.4 cm on image 50/series 2 versus 1.3 cm previously. Adrenals/Urinary Tract: Normal adrenal glands. Right lower pole renal lesion is too small to characterize. Normal left kidney, without hydronephrosis. Normal urinary bladder. Stomach/Bowel: Stomach is primarily underdistended. No gross abnormality identified. Suspect perirectal varices. Right-sided mild colonic wall thickening, including on image 81/series 2. This is slightly improved. Normal terminal ileum. Normal small bowel. Vascular/Lymphatic: Aortic and branch vessel atherosclerosis. Main portal vein and superior mesenteric vein are patent. Left portal vein is not well visualized and may be thrombosed, given appearance on ultrasound. There is a diminutive proximal right portal vein. Portosystemic collaterals  including gastric varices. No abdominopelvic adenopathy. Reproductive: Normal prostate. Other: Slight increase in small to moderate volume abdominal pelvic ascites. Gas about the right anterior abdominal wall, including on image 90/series 2. Fluid containing periumbilical ventral wall hernia. Musculoskeletal: Remote trauma involving the left hemipelvis. IMPRESSION: 1. Mild progression of pulmonary metastasis. 2. Development of mediastinal adenopathy, consistent with nodal metastasis. 3. Progression of relatively diffuse hepatic metastasis. 4. Cirrhosis. Portal venous hypertension with esophageal, gastric, and perirectal varices. Suspicion of left portal vein chronic thrombosis. Main portal vein is opacified. 5. Increased abdominal pelvic ascites. 6. Decreased right-sided colonic wall thickening which may be related to portal venous hypertension. Infectious colitis cannot be excluded. 7. Similarly, gallbladder wall thickening is nonspecific in the setting of portal venous hypertension.  8. Similar splenic lesions which could represent metastasis or if patient is immunocompromised, fungal infection. 9. Similar osseous metastasis. 10.  Coronary artery atherosclerosis. Aortic atherosclerosis. Electronically Signed   By: Abigail Miyamoto M.D.   On: 11/23/2016 09:57   Ct Abdomen Pelvis W Contrast  Result Date: 11/23/2016 CLINICAL DATA:  Distal esophageal cancer. Restaging. Currently on chemotherapy. Diabetes. Chronic bronchitis. Appendectomy. Alcohol abuse. EXAM: CT CHEST, ABDOMEN, AND PELVIS WITH CONTRAST TECHNIQUE: Multidetector CT imaging of the chest, abdomen and pelvis was performed following the standard protocol during bolus administration of intravenous contrast. CONTRAST:  140m ISOVUE-300 IOPAMIDOL (ISOVUE-300) INJECTION 61% COMPARISON:  09/18/2016 FINDINGS: CT CHEST FINDINGS Cardiovascular: Right Port-A-Cath which terminates at the high right atrium. Normal heart size. Multivessel coronary artery  atherosclerosis. No central pulmonary embolism, on this non-dedicated study. Esophageal varices, including on image 42/ series 2, similar. Mediastinum/Nodes: Mild left gynecomastia. No hilar adenopathy. A node within the azygoesophageal recess is enlarged, 1.5 cm on image 38/series 2. Mild distal esophageal wall thickening is similar. Lungs/Pleura: No pleural fluid. Mild centrilobular emphysema. Right apical pulmonary nodule is new at 6 mm on image 23/series 3. Posterior subpleural right upper lobe nodule is increased at 5 mm today versus 4 mm on the prior. Example image 49/series 3. New pleural-based left upper lobe pulmonary nodule measures 5 mm on image 67/series 3. Lateral left upper lobe 3 mm pulmonary nodule is minimally increased on image 85/series 3. Musculoskeletal: Remote trauma involving right-sided ribs and medial clavicle. Similar spinous process sclerotic metastasis at T1. Left-sided sternal metastasis is also not significantly changed. CT ABDOMEN PELVIS FINDINGS Hepatobiliary: Cirrhotic appearance the liver, with an irregular capsule. Widespread, relatively diffuse hepatic metastasis. Progressive, but difficult to quantify secondary to relative diffuse appearance. Index lesion in the lateral segment left liver lobe measures 2.0 cm on image 54/series 2 versus 1.8 cm on the prior exam (when remeasured). A new or significantly enlarged lesion in the hepatic dome, compressing the intrahepatic IVC. Example at 3.4 cm on image 43/series 2. Mild gallbladder wall thickening, without calcified stone. Example image 65/series 2. No biliary duct dilatation. Pancreas: Pancreatic atrophy, without duct dilatation or definite acute inflammation. Spleen: Similar appearance of multiple hypo attenuating splenic lesions. Example a 1.4 cm on image 50/series 2 versus 1.3 cm previously. Adrenals/Urinary Tract: Normal adrenal glands. Right lower pole renal lesion is too small to characterize. Normal left kidney, without  hydronephrosis. Normal urinary bladder. Stomach/Bowel: Stomach is primarily underdistended. No gross abnormality identified. Suspect perirectal varices. Right-sided mild colonic wall thickening, including on image 81/series 2. This is slightly improved. Normal terminal ileum. Normal small bowel. Vascular/Lymphatic: Aortic and branch vessel atherosclerosis. Main portal vein and superior mesenteric vein are patent. Left portal vein is not well visualized and may be thrombosed, given appearance on ultrasound. There is a diminutive proximal right portal vein. Portosystemic collaterals including gastric varices. No abdominopelvic adenopathy. Reproductive: Normal prostate. Other: Slight increase in small to moderate volume abdominal pelvic ascites. Gas about the right anterior abdominal wall, including on image 90/series 2. Fluid containing periumbilical ventral wall hernia. Musculoskeletal: Remote trauma involving the left hemipelvis. IMPRESSION: 1. Mild progression of pulmonary metastasis. 2. Development of mediastinal adenopathy, consistent with nodal metastasis. 3. Progression of relatively diffuse hepatic metastasis. 4. Cirrhosis. Portal venous hypertension with esophageal, gastric, and perirectal varices. Suspicion of left portal vein chronic thrombosis. Main portal vein is opacified. 5. Increased abdominal pelvic ascites. 6. Decreased right-sided colonic wall thickening which may be related to portal venous hypertension. Infectious colitis cannot  be excluded. 7. Similarly, gallbladder wall thickening is nonspecific in the setting of portal venous hypertension. 8. Similar splenic lesions which could represent metastasis or if patient is immunocompromised, fungal infection. 9. Similar osseous metastasis. 10.  Coronary artery atherosclerosis. Aortic atherosclerosis. Electronically Signed   By: Abigail Miyamoto M.D.   On: 11/23/2016 09:57   US Paracentesis  Result Date: 12/10/2016 INDICATION: Patient with metastatic  esophageal cancer with abdominal ascites. Request is made for a therapeutic paracentesis. EXAM: ULTRASOUND GUIDED THERAPEUTIC PARACENTESIS MEDICATIONS: 1% lidocaine COMPLICATIONS: None immediate. PROCEDURE: Informed written consent was obtained from the patient after a discussion of the risks, benefits and alternatives to treatment. A timeout was performed prior to the initiation of the procedure. Initial ultrasound scanning demonstrates a small amount of ascites within the right upper abdominal quadrant. The right upper abdomen was prepped and draped in the usual sterile fashion. 1% lidocaine was used for local anesthesia. Following this, a 19 gauge, 7-cm, Yueh catheter was introduced. An ultrasound image was saved for documentation purposes. The paracentesis was performed. The catheter was removed and a dressing was applied. The patient tolerated the procedure well without immediate post procedural complication. FINDINGS: A total of approximately 2.4 L of yellow fluid was removed. IMPRESSION: Successful ultrasound-guided paracentesis yielding 2.4 liters of peritoneal fluid. Read by: Saverio Danker, PA-C Electronically Signed   By: Aletta Edouard M.D.   On: 12/09/2016 15:02   Dg Abd Portable 1v  Result Date: 12/08/2016 CLINICAL DATA:  Abdominal pain.  History of esophageal carcinoma EXAM: PORTABLE ABDOMEN - 1 VIEW COMPARISON:  CT abdomen pelvis November 22, 2016 FINDINGS: There is no bowel dilatation or air-fluid levels suggesting bowel obstruction. No free air. There are coils in the pelvic region on the left. A somewhat hazy appearance of the abdomen raises question of ascites. IMPRESSION: No bowel obstruction or free air evident.  Question ascites. Electronically Signed   By: Lowella Grip III M.D.   On: 12/08/2016 13:26   US Abdomen Limited Ruq  Result Date: 11/20/2016 CLINICAL DATA:  Esophageal cancer, liver metastases, abdominal distension, worsening LFTs, asthma, hypertension, diabetes  mellitus, ethanol abuse EXAM: US ABDOMEN LIMITED - RIGHT UPPER QUADRANT COMPARISON:  CT abdomen 09/18/2016 FINDINGS: Gallbladder: Contracted with thickened wall. No definite shadowing calculi or sonographic Murphy sign. Common bile duct: Diameter: 4 mm diameter, normal Liver: Heterogeneous appearance with innumerable nodular foci compatible with widespread metastatic disease. No blood flow identified within portal vein on color Doppler imaging compatible with portal vein thrombosis. Small amount of scattered ascites throughout abdomen. IMPRESSION: Widespread hepatic metastatic disease. Portal vein thrombosis. Minimal ascites. Findings called to Kirby Crigler PA on 11/20/2016 at 1033 hours. Electronically Signed   By: Lavonia Dana M.D.   On: 11/20/2016 10:35    Time Spent in minutes  30   SINGH,PRASHANT K M.D on 12/10/2016 at 9:10 AM  Between 7am to 7pm - Pager - [phone]  After 7pm go to [email or web address] - password [facility]  [facility] -  Office  [phone]

## 2016-12-10 NOTE — Progress Notes (Signed)
  Subjective:  Patient continues to complain of mid abdominal pain. He states he did not experience any relief following abdominal paracentesis. He denies nausea or vomiting. He states his stool was black.   Objective: Blood pressure 116/65, pulse 95, temperature 98 F (36.7 C), temperature source Oral, resp. rate 18, height '6\' 2"'$  (1.88 m), weight 176 lb 11.2 oz (80.2 kg), SpO2 96 %. Patient is alert and does not have asterixis. Sclera is icteric. Abdomen is protuberant with small umbilicus hernia. On palpation it is soft with very umbilicus tenderness. No organomegaly or subcutaneous nodules noted. He has 2+ pitting edema involving both legs.  Labs/studies Results:   Recent Labs  12/08/16 0421 12/08/16 1658 12/09/16 0441 12/10/16 0445  WBC 13.7*  --  12.9* 12.4*  HGB 10.2* 10.6* 10.3* 10.2*  HCT 30.6* 31.8* 31.4* 30.9*  PLT 110*  --  114* 118*    BMET   Recent Labs  12/08/16 0421 12/08/16 0849 12/09/16 0807  NA 131* 130* 133*  K 3.8 3.9 3.5  CL 98* 97* 101  CO2 '23 24 25  '$ GLUCOSE 113* 127* 106*  BUN '7 6 6  '$ CREATININE 0.47* 0.49* 0.53*  CALCIUM 7.9* 8.0* 7.8*    LFT   Recent Labs  12/08/16 0421 12/08/16 0849 12/09/16 0807  PROT 6.9 7.3 7.2  ALBUMIN 2.4* 2.4* 2.4*  AST 186* 195* 176*  ALT 47 50 45  ALKPHOS 176* 174* 174*  BILITOT 7.1* 7.6* 8.2*    PT/INR   Recent Labs  12/08/16 0421 12/10/16 0445  LABPROT 26.1* 23.3*  INR 2.34 2.03     Assessment:  #1. Upper GI bleed most likely secondary to esophageal varices. H&H is stable. Day 3 on octreotide infusion. #2. Metastatic esophageal squamous cell carcinoma. #3. Ascites appears to be most likely due to cirrhosis but he could also have peritoneal metastases given widespread malignancy.  #4. Cholestasis secondary to widespread liver metastases and cirrhosis. #5. Mid abdominal pain. Patient is on pain medication. I wonder if he has peritoneal metastases.  Recommendations:  Full liquid diet. Taper  and discontinue octreotide infusion over the next 4 hours. Continue IV pantoprazole for now. CBC in a.m.

## 2016-12-11 ENCOUNTER — Inpatient Hospital Stay (HOSPITAL_COMMUNITY): Payer: Medicaid Other

## 2016-12-11 DIAGNOSIS — C155 Malignant neoplasm of lower third of esophagus: Secondary | ICD-10-CM

## 2016-12-11 DIAGNOSIS — Z515 Encounter for palliative care: Secondary | ICD-10-CM

## 2016-12-11 DIAGNOSIS — Z7189 Other specified counseling: Secondary | ICD-10-CM

## 2016-12-11 MED ORDER — PANTOPRAZOLE SODIUM 40 MG PO TBEC: 40.0000 mg | DELAYED_RELEASE_TABLET | Freq: Two times a day (BID) | ORAL | 0 refills | Status: AC

## 2016-12-11 MED ORDER — LORAZEPAM 2 MG/ML PO CONC: 1.0000 mg | Freq: Four times a day (QID) | ORAL | 0 refills | Status: AC | PRN

## 2016-12-11 MED ORDER — MORPHINE SULFATE 15 MG PO TABS: 15.0000 mg | ORAL_TABLET | Freq: Four times a day (QID) | ORAL | 0 refills | Status: AC | PRN

## 2016-12-11 MED ORDER — PANTOPRAZOLE SODIUM 40 MG PO TBEC: 40.0000 mg | DELAYED_RELEASE_TABLET | Freq: Two times a day (BID) | ORAL | Status: DC

## 2016-12-11 MED ORDER — PANTOPRAZOLE SODIUM 40 MG IV SOLR
INTRAVENOUS | Status: AC
  Filled 2016-12-11: qty 80

## 2016-12-11 NOTE — Progress Notes (Signed)
REVIEWED. AGREE. NO ADDITIONAL RECOMMENDATIONS slf   Subjective: Abdominal pain improved from yesterday, now with mild discomfort in setting of significant ascites. No N/V. Tolerating diet. Wants more salt from tray. Wants to go home. States his fiancee works with housekeeping here on the night shift, and she will need to pick him up to go home. NO overt GI bleeding.   Objective: Vital signs in last 24 hours: Temp:  [97.1 F (36.2 C)-98.4 F (36.9 C)] 97.1 F (36.2 C) (12/18 0618) Pulse Rate:  [92-100] 98 (12/18 0618) Resp:  [18] 18 (12/18 0618) BP: (105-113)/(64-71) 108/64 (12/18 0618) SpO2:  [99 %-100 %] 100 % (12/18 0618) Weight:  [180 lb 1.9 oz (81.7 kg)] 180 lb 1.9 oz (81.7 kg) (12/18 0618) Last BM Date: 12/10/16 General:   Alert and oriented, pleasant Abdomen:  Bowel sounds present, distended, with moderately tense ascites. No rebound or guarding Extremities:  With 2-3+ edema/ansarca extending to upper thigh  Neurologic:  Alert and  oriented x4 Psych:  Alert and cooperative. Normal mood and affect.  Intake/Output from previous day: 12/17 0701 - 12/18 0700 In: 1335.8 [P.O.:480; I.V.:755.8; IV Piggyback:100] Out: 1 [Stool:1] Intake/Output this shift: No intake/output data recorded.  Lab Results:  Recent Labs  12/08/16 1658 12/09/16 0441 12/10/16 0445  WBC  --  12.9* 12.4*  HGB 10.6* 10.3* 10.2*  HCT 31.8* 31.4* 30.9*  PLT  --  114* 118*   BMET  Recent Labs  12/09/16 0807  NA 133*  K 3.5  CL 101  CO2 25  GLUCOSE 106*  BUN 6  CREATININE 0.53*  CALCIUM 7.8*   LFT  Recent Labs  12/09/16 0807  PROT 7.2  ALBUMIN 2.4*  AST 176*  ALT 45  ALKPHOS 174*  BILITOT 8.2*   PT/INR  Recent Labs  12/10/16 0445  LABPROT 23.3*  INR 2.03    Studies/Results: US Paracentesis  Result Date: 12/10/2016 INDICATION: Patient with metastatic esophageal cancer with abdominal ascites. Request is made for a therapeutic paracentesis. EXAM: ULTRASOUND GUIDED  THERAPEUTIC PARACENTESIS MEDICATIONS: 1% lidocaine COMPLICATIONS: None immediate. PROCEDURE: Informed written consent was obtained from the patient after a discussion of the risks, benefits and alternatives to treatment. A timeout was performed prior to the initiation of the procedure. Initial ultrasound scanning demonstrates a small amount of ascites within the right upper abdominal quadrant. The right upper abdomen was prepped and draped in the usual sterile fashion. 1% lidocaine was used for local anesthesia. Following this, a 19 gauge, 7-cm, Yueh catheter was introduced. An ultrasound image was saved for documentation purposes. The paracentesis was performed. The catheter was removed and a dressing was applied. The patient tolerated the procedure well without immediate post procedural complication. FINDINGS: A total of approximately 2.4 L of yellow fluid was removed. IMPRESSION: Successful ultrasound-guided paracentesis yielding 2.4 liters of peritoneal fluid. Read by: Saverio Danker, PA-C Electronically Signed   By: Aletta Edouard M.D.   On: 12/09/2016 15:02    Assessment: 55 year old male with metastatic esophageal cancer that has progressed despite treatment, ETOH cirrhosis, portal vein thrombosis, admitted with likely variceal bleed and coagulopathy; he was unable to complete an EGD at time of admission due to significant coagulopathy. [place] with paracentesis on 12/16 with 2.4 liters removed (first paracentesis). Needs therapeutic paracentesis today, then will be discharged home with hospice. Palliative care on board. No further overt GI bleeding. Prognosis poor.   Plan: Therapeutic paracentesis today with albumin ordered Appreciate palliative care input Day 4 of Rocephin IV; may send  home on Cipro 500 mg BID, with last dose on 12/20 Advance to soft diet  Home today Will follow peripherally   Annitta Needs, ANP-BC [facility]     LOS: 4 days    12/11/2016, 9:05 AM

## 2016-12-11 NOTE — Progress Notes (Signed)
Daily Progress Note   Patient Name: Joshua Greene       Date: 12/11/2016 DOB: 07/19/1961  Age: 55 y.o. MRN#: [id number] Attending Physician: Thurnell Lose, MD Primary Care Physician: Raylene Everts, MD Admit Date: 12/07/2016  Reason for Consultation/Follow-up: Establishing goals of care and Psychosocial/spiritual support  Subjective: Joshua Greene is resting quietly in bed. He greets me, making and keeping eye contact as I enter. He shares that he does not feel well. We talk about his functional status, and my worry about his bleeding.  We talk about his future cancer treatments, and the concerned that he is too weak for chemotherapy.   We talk about his treatment plan. I share that I have discussed his case with his oncologist, Dr. Whitney Muse, and she, along with the rest of the team is recommending he accept the benefits of hospice. We talk about the benefits of having a registered nurse, age, social worker and chaplain come to his home to help him his family through this difficult time. Later in the conversation Joshua Greene live-in girlfriend of several years arrives.  Joshua Greene shares her worry about Joshua Greene being at home alone overnight while she works. We talk about functional status, and expected declines. Joshua Greene states that Joshua Greene has always preferred to lie in bed, even before he got sick. I share the chronic illness pathway as related to cancer.  We talk about the benefits of hospice in detail. Joshua Greene and and 20 at agree to discuss the benefits of hospice with [facility] provider.  Length of Stay: 4  Current Medications: Scheduled Meds:  . bisacodyl  10 mg Rectal Daily  . cefTRIAXone (ROCEPHIN)  IV  1 g Intravenous Q24H  . docusate sodium  200 mg Oral BID    . feeding supplement  1 Container Oral TID BM  . feeding supplement (PRO-STAT SUGAR FREE 64)  30 mL Oral BID  . furosemide  20 mg Oral Daily  . pantoprazole  40 mg Oral BID AC  . sorbitol, milk of mag, mineral oil, glycerin (SMOG) enema  960 mL Rectal Once    Continuous Infusions:   PRN Meds: albumin human, albuterol, HYDROcodone-acetaminophen, ondansetron **OR** ondansetron (ZOFRAN) IV  Physical Exam  Constitutional: He is oriented to person, place, and time. No distress.  Very frail and  thin, temporal wasting, chronically ill appearing  HENT:  Head: Normocephalic and atraumatic.  Temporal wasting  Cardiovascular: Normal rate and regular rhythm.   Pulmonary/Chest: Effort normal. No respiratory distress.  Abdominal: He exhibits distension.  Abdomen firm, tight, markedly distended with ascites  Musculoskeletal:  Severe muscle wasting, swelling to bilateral lower extremities  Neurological: He is alert and oriented to person, place, and time.  Skin: Skin is warm and dry.  Nursing note and vitals reviewed.           Vital Signs: BP 108/64 (BP Location: Left Arm)   Pulse 98   Temp 97.1 F (36.2 C) (Oral)   Resp 18   Ht '6\' 2"'$  (1.88 m)   Wt 81.7 kg (180 lb 1.9 oz)   SpO2 100%   BMI 23.13 kg/m  SpO2: SpO2: 100 % O2 Device: O2 Device: Not Delivered O2 Flow Rate:    Intake/output summary:  Intake/Output Summary (Last 24 hours) at 12/11/16 1314 Last data filed at 12/11/16 6834  Gross per 24 hour  Intake           855.83 ml  Output                1 ml  Net           854.83 ml   LBM: Last BM Date: 12/10/16 Baseline Weight: Weight: 74.8 kg (165 lb) Most recent weight: Weight: 81.7 kg (180 lb 1.9 oz)       Palliative Assessment/Data:    Flowsheet Rows   Flowsheet Row Most Recent Value  Intake Tab  Referral Department  Hospitalist  Unit at Time of Referral  ER  Palliative Care Primary Diagnosis  Cancer  Date Notified  12/07/16  Palliative Care Type  New  Palliative care  Reason for referral  Clarify Goals of Care  Date of Admission  12/07/16  Date first seen by Palliative Care  12/07/16  # of days Palliative referral response time  0 Day(s)  # of days IP prior to Palliative referral  0  Clinical Assessment  Palliative Performance Scale Score  60%  Pain Max last 24 hours  Not able to report  Pain Min Last 24 hours  Not able to report  Dyspnea Max Last 24 Hours  Not able to report  Dyspnea Min Last 24 hours  Not able to report  Psychosocial & Spiritual Assessment  Palliative Care Outcomes  Patient/Family meeting held?  Yes  Who was at the meeting?  Patient only today  Palliative Care Outcomes  Counseled regarding hospice, Provided psychosocial or spiritual support  Patient/Family wishes: Interventions discontinued/not started   Mechanical Ventilation  Palliative Care follow-up planned  -- [Follow-up while at [facility]]      Patient Active Problem List   Diagnosis Date Noted  . Varices, esophageal ([place]) 12/08/2016  . Supratherapeutic INR   . Gastrointestinal hemorrhage 12/07/2016  . Alcoholic cirrhosis of liver with ascites ([place])   . Oral bleeding   . Decompensated liver disease ([place])   . Tachycardia   . Palliative care encounter   . Goals of care, counseling/discussion   . Portal vein thrombosis 11/23/2016  . Thrush of mouth and esophagus ([place]) 08/11/2016  . Bacterial intestinal infection 07/13/2016  . Liver metastases ([place]) 05/29/2016  . Esophageal cancer ([place]) 05/22/2016  . Alcohol abuse 08/02/2015    Palliative Care Assessment & Plan   Patient Profile: 55 y.o. 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 bleed.   Assessment: Esophageal cancer with liver metastasis: oncology is recommending the  benefits of hospice services in home.  Recommendations/Plan:  hospice services in the home.  We talk about the concepts of hospice in detail, including benefits and limitations. Family is accepting to talk with [facility]. I encourage Joshua Greene and his significant other, Joshua Greene, to have Joshua Greene parents present during this conversation.  Goals of Care and Additional Recommendations:  Limitations on Scope of Treatment: No extraordinary measures such as CPR or intubation.  Chemotherapy is not being offered at this time  Code Status:    Code Status Orders        Start     Ordered   12/07/16 1838  Do not attempt resuscitation (DNR)  Continuous    Question Answer Comment  In the event of cardiac or respiratory ARREST Do not call a "code blue"   In the event of cardiac or respiratory ARREST Do not perform Intubation, CPR, defibrillation or ACLS   In the event of cardiac or respiratory ARREST Use medication by any route, position, wound care, and other measures to relive pain and suffering. May use oxygen, suction and manual treatment of airway obstruction as needed for comfort.      12/07/16 1837    Code Status History    Date Active Date Inactive Code Status Order ID Comments User Context   12/07/2016  9:38 AM 12/07/2016  6:37 PM DNR [id number]  Merrily Pew, MD ED       Prognosis:   < 6 months, likely based on cancer burden, frailty, functional decline  Discharge Planning:  Joshua Greene is open to talking with [facility] hospice about services they can provide  Care plan was discussed with seeing staff, case manager, social worker, Oncology, and Dr. Candiss Norse.   Thank you for allowing the Palliative Medicine Team to assist in the care of this patient.   Time In: 1040 Time Out: 1130 Total Time 50 minutes  Prolonged Time Billed  yes       Greater than 50%  of this time was spent counseling and coordinating care related to the above assessment and plan.  Drue Novel,  NP  Please contact Palliative Medicine Team phone at [phone] for questions and concerns.

## 2016-12-11 NOTE — Care Management Note (Signed)
Case Management Note  Patient Details  Name: Joshua Greene MRN: [id number] Date of Birth: 08/06/1961    Expected Discharge Date:       12/11/2016           Expected Discharge Plan:  Home w Hospice Care  In-House Referral:  Hospice / Palliative Care  Discharge planning Services  CM Consult  Post Acute Care Choice:  Hospice Choice offered to:  Patient  DME Arranged:    DME Agency:     HH Arranged:    [place] Agency:  Other - See comment Rocky Morel)  Status of Service:  Completed, signed off  If discussed at [place] of Stay Meetings, dates discussed:    Additional Comments: Patient discharging home today. Spoke with patient and he would like to consider hospice services with [facility]. Charles with [facility] notified and will arrange in home consultation.    Errick Salts, Chauncey Reading, RN 12/11/2016, 1:23 PM

## 2016-12-11 NOTE — Clinical Social Work Note (Signed)
CSW provided Advance Directive information to patient. Patient and fiance advised that they would complete and bring it back to the hospital at a later date. CSW offered assistance in completion. Fiance stated that she works here so she and patient will come back.  Danarius Mcconathy, Clydene Pugh, LCSW

## 2016-12-11 NOTE — Discharge Instructions (Signed)
Follow with Primary MD Raylene Everts, MD in 7 days   Get CBC, CMP, 2 view Chest X ray checked  by Primary MD or SNF MD in 5-7 days ( we routinely change or add medications that can affect your baseline labs and fluid status, therefore we recommend that you get the mentioned basic workup next visit with your PCP, your PCP may decide not to get them or add new tests based on their clinical decision)   Activity: As tolerated with Full fall precautions use walker/cane & assistance as needed   Disposition Home with hospice   Diet:   DIET SOFT  with feeding assistance and aspiration precautions.  For Heart failure patients - Check your Weight same time everyday, if you gain over 2 pounds, or you develop in leg swelling, experience more shortness of breath or chest pain, call your Primary MD immediately. Follow Cardiac Low Salt Diet and 1.5 lit/day fluid restriction.   On your next visit with your primary care physician please Get Medicines reviewed and adjusted.   Please request your Prim.MD to go over all Hospital Tests and Procedure/Radiological results at the follow up, please get all Hospital records sent to your Prim MD by signing hospital release before you go home.   If you experience worsening of your admission symptoms, develop shortness of breath, life threatening emergency, suicidal or homicidal thoughts you must seek medical attention immediately by calling 911 or calling your MD immediately  if symptoms less severe.  You Must read complete instructions/literature along with all the possible adverse reactions/side effects for all the Medicines you take and that have been prescribed to you. Take any new Medicines after you have completely understood and accpet all the possible adverse reactions/side effects.   Do not drive, operate heavy machinery, perform activities at heights, swimming or participation in water activities or provide baby sitting services if your were admitted for  syncope or siezures until you have seen by Primary MD or a Neurologist and advised to do so again.  Do not drive when taking Pain medications.    Do not take more than prescribed Pain, Sleep and Anxiety Medications  Special Instructions: If you have smoked or chewed Tobacco  in the last 2 yrs please stop smoking, stop any regular Alcohol  and or any Recreational drug use.  Wear Seat belts while driving.   Please note  You were cared for by a hospitalist during your hospital stay. If you have any questions about your discharge medications or the care you received while you were in the hospital after you are discharged, you can call the unit and asked to speak with the hospitalist on call if the hospitalist that took care of you is not available. Once you are discharged, your primary care physician will handle any further medical issues. Please note that NO REFILLS for any discharge medications will be authorized once you are discharged, as it is imperative that you return to your primary care physician (or establish a relationship with a primary care physician if you do not have one) for your aftercare needs so that they can reassess your need for medications and monitor your lab values.

## 2016-12-11 NOTE — Discharge Summary (Signed)
[name] VQM:[id number] DOB: 1961/01/22 DOA: 12/07/2016  PCP: Raylene Everts, MD  Admit date: 12/07/2016  Discharge date: 12/11/2016  Admitted From: Home   Disposition:  Home Hospice   Recommendations for Outpatient Follow-up:   Follow up with PCP in 1-2 weeks  PCP Please obtain BMP/CBC, 2 view CXR in 1week,  (see Discharge instructions)   PCP Please follow up on the following pending results: None   Home Health: Hospice   Equipment/Devices: None  Consultations: Pall care, GI Discharge Condition: Guarded   CODE STATUS: DNR   Diet Recommendation: DIET SOFT      Chief Complaint  Patient presents with  . 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 He has advanced metastatic cancer chose to be DO NOT RESUSCITATE, currently on IV PPI and octreotide drip with stable H&H, plan is to discharge tomorrow once he is off octreotide drip with most likely home hospice. Palliative care to reevaluate tomorrow.  Hospital issues addressed     1.GI bleed most likely upper. Likely from his esophageal Varices due to portal hypertension. H&H remained stable,was on IV PPI and octreotide drip. Seen by GI, also received Rocephin for SBP prophylaxis. Jennye Moccasin has been discontinued due to this bleed and being at risk for future GI bleeds. Patient has accepted the risks and benefits and accept this plan.   I had detailed discussions  with GI and his primary oncology team, both concur that patient is a good candidate for home hospice, seen by palliative care, patient agreeable to home hospice and goal of care being comfort. At this point he will be discharged home with home hospice with goal of care being comfort.   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 Overall prognosis is poor, for now supportive care, discussed his case with his oncologist [name]. Per oncology team he is appropriate for hospice, patient also accepts this, will be discharged with home hospice was seen by palliative care now DO NOT RESUSCITATE.  3.Remote history of alcohol abuse. Patient now confessed that he was drinking up to 2 weeks ago, currently no signs of DTs, will be monitored.  4.History of portal vein thrombosis with known esophageal varices and moderate ascites. He understands that goal of care now will be comfort. He underwent first therapeutic paracentesis on 12/09/2016 with 2 L of fluid removed, will undergo another therapeutic paracentesis today before discharge. If ascites continues to be a problem paracentesis drain placement can be considered a symptom relief. For now continue Lasix. Goal of care now comfort.   Discharge diagnosis     Active Problems:   Alcohol abuse   Esophageal cancer ([place])  Liver metastases ([place])   Portal vein thrombosis   Gastrointestinal hemorrhage   Alcoholic cirrhosis of liver with ascites ([facility])   Varices, esophageal ([place])   Supratherapeutic INR    Discharge instructions    Discharge Instructions    Discharge instructions    Complete by:  As directed    Follow with Primary MD Raylene Everts, MD in 7 days   Get CBC, CMP, 2 view Chest X ray checked  by Primary MD or SNF MD in 5-7 days ( we routinely change or add medications that can affect your baseline labs and fluid status, therefore we recommend that you get the mentioned basic workup  next visit with your PCP, your PCP may decide not to get them or add new tests based on their clinical decision)   Activity: As tolerated with Full fall precautions use walker/cane & assistance as needed   Disposition Home with hospice   Diet:   DIET SOFT  with feeding assistance and aspiration precautions.  For Heart failure patients - Check your Weight same time everyday, if you gain over 2 pounds, or you develop in leg swelling, experience more shortness of breath or chest pain, call your Primary MD immediately. Follow Cardiac Low Salt Diet and 1.5 lit/day fluid restriction.   On your next visit with your primary care physician please Get Medicines reviewed and adjusted.   Please request your Prim.MD to go over all Hospital Tests and Procedure/Radiological results at the follow up, please get all Hospital records sent to your Prim MD by signing hospital release before you go home.   If you experience worsening of your admission symptoms, develop shortness of breath, life threatening emergency, suicidal or homicidal thoughts you must seek medical attention immediately by calling 911 or calling your MD immediately  if symptoms less severe.  You Must read complete instructions/literature along with all the possible adverse reactions/side effects for all the Medicines you take and that have been prescribed to you. Take any new Medicines after you have completely understood and accpet all the possible adverse reactions/side effects.   Do not drive, operate heavy machinery, perform activities at heights, swimming or participation in water activities or provide baby sitting services if your were admitted for syncope or siezures until you have seen by Primary MD or a Neurologist and advised to do so again.  Do not drive when taking Pain medications.    Do not take more than prescribed Pain, Sleep and Anxiety Medications  Special Instructions: If you have smoked or chewed Tobacco  in the  last 2 yrs please stop smoking, stop any regular Alcohol  and or any Recreational drug use.  Wear Seat belts while driving.   Please note  You were cared for by a hospitalist during your hospital stay. If you have any questions about your discharge medications or the care you received while you were in the hospital after you are discharged, you can call the unit and asked to speak with the hospitalist on call if the hospitalist that took care of you is not available. Once you are discharged, your primary care physician will handle any further medical issues. Please note that NO REFILLS for any discharge medications will be authorized once you are discharged, as it is imperative that you return to your primary care physician (or establish a relationship with a primary care physician if you do not have one) for your aftercare needs so that they can reassess your need for medications and monitor  your lab values.   Increase activity slowly    Complete by:  As directed       Discharge Medications   Allergies as of 12/11/2016   No Known Allergies     Medication List    STOP taking these medications   rivaroxaban 20 MG Tabs tablet Commonly known as:  XARELTO     TAKE these medications   CARBOPLATIN IV Inject into the vein. Day 1, day 8, day 15 every 28 days   furosemide 20 MG tablet Commonly known as:  LASIX Take 1 tablet (20 mg total) by mouth daily.   lidocaine-prilocaine cream Commonly known as:  EMLA Apply 1 application topically as needed. Apply quarter size amount to affected area 1 hour prior to chemotherapy   LORazepam 2 MG/ML concentrated solution Commonly known as:  ATIVAN Take 0.5 mLs (1 mg total) by mouth every 6 (six) hours as needed for anxiety.   megestrol 400 MG/10ML suspension Commonly known as:  MEGACE Take 10 mLs (400 mg total) by mouth 2 (two) times daily.   morphine 15 MG tablet Commonly known as:  MSIR Take 1 tablet (15 mg total) by mouth every 6 (six)  hours as needed for severe pain.   ondansetron 8 MG disintegrating tablet Commonly known as:  ZOFRAN-ODT Take 8 mg by mouth every 8 (eight) hours as needed for nausea or vomiting.   pantoprazole 40 MG tablet Commonly known as:  PROTONIX Take 1 tablet (40 mg total) by mouth 2 (two) times daily before a meal.   potassium chloride SA 20 MEQ tablet Commonly known as:  K-DUR,KLOR-CON Take 3 tablets (60 mEq total) by mouth daily.   TAXOL IV Inject into the vein. Day 1, day 8, day 15, every 28 days       Follow-up Information    Raylene Everts, MD. Schedule an appointment as soon as possible for a visit in 1 week(s).   Specialty:  Family Medicine Contact information: [address] [place] [address] [phone]           Major procedures and Radiology Reports - PLEASE review detailed and final reports thoroughly  -        Ct Chest W Contrast  Result Date: 11/23/2016 CLINICAL DATA:  Distal esophageal cancer. Restaging. Currently on chemotherapy. Diabetes. Chronic bronchitis. Appendectomy. Alcohol abuse. EXAM: CT CHEST, ABDOMEN, AND PELVIS WITH CONTRAST TECHNIQUE: Multidetector CT imaging of the chest, abdomen and pelvis was performed following the standard protocol during bolus administration of intravenous contrast. CONTRAST:  148m ISOVUE-300 IOPAMIDOL (ISOVUE-300) INJECTION 61% COMPARISON:  09/18/2016 FINDINGS: CT CHEST FINDINGS Cardiovascular: Right Port-A-Cath which terminates at the high right atrium. Normal heart size. Multivessel coronary artery atherosclerosis. No central pulmonary embolism, on this non-dedicated study. Esophageal varices, including on image 42/ series 2, similar. Mediastinum/Nodes: Mild left gynecomastia. No hilar adenopathy. A node within the azygoesophageal recess is enlarged, 1.5 cm on image 38/series 2. Mild distal esophageal wall thickening is similar. Lungs/Pleura: No pleural fluid. Mild centrilobular emphysema. Right apical pulmonary  nodule is new at 6 mm on image 23/series 3. Posterior subpleural right upper lobe nodule is increased at 5 mm today versus 4 mm on the prior. Example image 49/series 3. New pleural-based left upper lobe pulmonary nodule measures 5 mm on image 67/series 3. Lateral left upper lobe 3 mm pulmonary nodule is minimally increased on image 85/series 3. Musculoskeletal: Remote trauma involving right-sided ribs and medial clavicle. Similar spinous process sclerotic metastasis at T1.  Left-sided sternal metastasis is also not significantly changed. CT ABDOMEN PELVIS FINDINGS Hepatobiliary: Cirrhotic appearance the liver, with an irregular capsule. Widespread, relatively diffuse hepatic metastasis. Progressive, but difficult to quantify secondary to relative diffuse appearance. Index lesion in the lateral segment left liver lobe measures 2.0 cm on image 54/series 2 versus 1.8 cm on the prior exam (when remeasured). A new or significantly enlarged lesion in the hepatic dome, compressing the intrahepatic IVC. Example at 3.4 cm on image 43/series 2. Mild gallbladder wall thickening, without calcified stone. Example image 65/series 2. No biliary duct dilatation. Pancreas: Pancreatic atrophy, without duct dilatation or definite acute inflammation. Spleen: Similar appearance of multiple hypo attenuating splenic lesions. Example a 1.4 cm on image 50/series 2 versus 1.3 cm previously. Adrenals/Urinary Tract: Normal adrenal glands. Right lower pole renal lesion is too small to characterize. Normal left kidney, without hydronephrosis. Normal urinary bladder. Stomach/Bowel: Stomach is primarily underdistended. No gross abnormality identified. Suspect perirectal varices. Right-sided mild colonic wall thickening, including on image 81/series 2. This is slightly improved. Normal terminal ileum. Normal small bowel. Vascular/Lymphatic: Aortic and branch vessel atherosclerosis. Main portal vein and superior mesenteric vein are patent. Left  portal vein is not well visualized and may be thrombosed, given appearance on ultrasound. There is a diminutive proximal right portal vein. Portosystemic collaterals including gastric varices. No abdominopelvic adenopathy. Reproductive: Normal prostate. Other: Slight increase in small to moderate volume abdominal pelvic ascites. Gas about the right anterior abdominal wall, including on image 90/series 2. Fluid containing periumbilical ventral wall hernia. Musculoskeletal: Remote trauma involving the left hemipelvis. IMPRESSION: 1. Mild progression of pulmonary metastasis. 2. Development of mediastinal adenopathy, consistent with nodal metastasis. 3. Progression of relatively diffuse hepatic metastasis. 4. Cirrhosis. Portal venous hypertension with esophageal, gastric, and perirectal varices. Suspicion of left portal vein chronic thrombosis. Main portal vein is opacified. 5. Increased abdominal pelvic ascites. 6. Decreased right-sided colonic wall thickening which may be related to portal venous hypertension. Infectious colitis cannot be excluded. 7. Similarly, gallbladder wall thickening is nonspecific in the setting of portal venous hypertension. 8. Similar splenic lesions which could represent metastasis or if patient is immunocompromised, fungal infection. 9. Similar osseous metastasis. 10.  Coronary artery atherosclerosis. Aortic atherosclerosis. Electronically Signed   By: Abigail Miyamoto M.D.   On: 11/23/2016 09:57   Ct Abdomen Pelvis W Contrast  Result Date: 11/23/2016 CLINICAL DATA:  Distal esophageal cancer. Restaging. Currently on chemotherapy. Diabetes. Chronic bronchitis. Appendectomy. Alcohol abuse. EXAM: CT CHEST, ABDOMEN, AND PELVIS WITH CONTRAST TECHNIQUE: Multidetector CT imaging of the chest, abdomen and pelvis was performed following the standard protocol during bolus administration of intravenous contrast. CONTRAST:  132m ISOVUE-300 IOPAMIDOL (ISOVUE-300) INJECTION 61% COMPARISON:  09/18/2016  FINDINGS: CT CHEST FINDINGS Cardiovascular: Right Port-A-Cath which terminates at the high right atrium. Normal heart size. Multivessel coronary artery atherosclerosis. No central pulmonary embolism, on this non-dedicated study. Esophageal varices, including on image 42/ series 2, similar. Mediastinum/Nodes: Mild left gynecomastia. No hilar adenopathy. A node within the azygoesophageal recess is enlarged, 1.5 cm on image 38/series 2. Mild distal esophageal wall thickening is similar. Lungs/Pleura: No pleural fluid. Mild centrilobular emphysema. Right apical pulmonary nodule is new at 6 mm on image 23/series 3. Posterior subpleural right upper lobe nodule is increased at 5 mm today versus 4 mm on the prior. Example image 49/series 3. New pleural-based left upper lobe pulmonary nodule measures 5 mm on image 67/series 3. Lateral left upper lobe 3 mm pulmonary nodule is minimally increased on image 85/series 3.  Musculoskeletal: Remote trauma involving right-sided ribs and medial clavicle. Similar spinous process sclerotic metastasis at T1. Left-sided sternal metastasis is also not significantly changed. CT ABDOMEN PELVIS FINDINGS Hepatobiliary: Cirrhotic appearance the liver, with an irregular capsule. Widespread, relatively diffuse hepatic metastasis. Progressive, but difficult to quantify secondary to relative diffuse appearance. Index lesion in the lateral segment left liver lobe measures 2.0 cm on image 54/series 2 versus 1.8 cm on the prior exam (when remeasured). A new or significantly enlarged lesion in the hepatic dome, compressing the intrahepatic IVC. Example at 3.4 cm on image 43/series 2. Mild gallbladder wall thickening, without calcified stone. Example image 65/series 2. No biliary duct dilatation. Pancreas: Pancreatic atrophy, without duct dilatation or definite acute inflammation. Spleen: Similar appearance of multiple hypo attenuating splenic lesions. Example a 1.4 cm on image 50/series 2 versus 1.3 cm  previously. Adrenals/Urinary Tract: Normal adrenal glands. Right lower pole renal lesion is too small to characterize. Normal left kidney, without hydronephrosis. Normal urinary bladder. Stomach/Bowel: Stomach is primarily underdistended. No gross abnormality identified. Suspect perirectal varices. Right-sided mild colonic wall thickening, including on image 81/series 2. This is slightly improved. Normal terminal ileum. Normal small bowel. Vascular/Lymphatic: Aortic and branch vessel atherosclerosis. Main portal vein and superior mesenteric vein are patent. Left portal vein is not well visualized and may be thrombosed, given appearance on ultrasound. There is a diminutive proximal right portal vein. Portosystemic collaterals including gastric varices. No abdominopelvic adenopathy. Reproductive: Normal prostate. Other: Slight increase in small to moderate volume abdominal pelvic ascites. Gas about the right anterior abdominal wall, including on image 90/series 2. Fluid containing periumbilical ventral wall hernia. Musculoskeletal: Remote trauma involving the left hemipelvis. IMPRESSION: 1. Mild progression of pulmonary metastasis. 2. Development of mediastinal adenopathy, consistent with nodal metastasis. 3. Progression of relatively diffuse hepatic metastasis. 4. Cirrhosis. Portal venous hypertension with esophageal, gastric, and perirectal varices. Suspicion of left portal vein chronic thrombosis. Main portal vein is opacified. 5. Increased abdominal pelvic ascites. 6. Decreased right-sided colonic wall thickening which may be related to portal venous hypertension. Infectious colitis cannot be excluded. 7. Similarly, gallbladder wall thickening is nonspecific in the setting of portal venous hypertension. 8. Similar splenic lesions which could represent metastasis or if patient is immunocompromised, fungal infection. 9. Similar osseous metastasis. 10.  Coronary artery atherosclerosis. Aortic atherosclerosis.  Electronically Signed   By: Abigail Miyamoto M.D.   On: 11/23/2016 09:57   US Abdomen Limited  Result Date: 12/11/2016 CLINICAL DATA:  Ascites. EXAM: LIMITED ABDOMEN ULTRASOUND FOR ASCITES TECHNIQUE: Limited ultrasound survey for ascites was performed in all four abdominal quadrants. COMPARISON:  CT scan of November 22, 2016. FINDINGS: Mild ascites is noted with largest pocket seen in right lower quadrant, although a large amount of small bowel is noted in this area. IMPRESSION: Mild ascites is noted. Adequate or safe pocket for paracentesis is not visualized. Electronically Signed   By: Marijo Conception, M.D.   On: 12/11/2016 11:03   US Paracentesis  Result Date: 12/10/2016 INDICATION: Patient with metastatic esophageal cancer with abdominal ascites. Request is made for a therapeutic paracentesis. EXAM: ULTRASOUND GUIDED THERAPEUTIC PARACENTESIS MEDICATIONS: 1% lidocaine COMPLICATIONS: None immediate. PROCEDURE: Informed written consent was obtained from the patient after a discussion of the risks, benefits and alternatives to treatment. A timeout was performed prior to the initiation of the procedure. Initial ultrasound scanning demonstrates a small amount of ascites within the right upper abdominal quadrant. The right upper abdomen was prepped and draped in the usual sterile fashion. 1%  lidocaine was used for local anesthesia. Following this, a 19 gauge, 7-cm, Yueh catheter was introduced. An ultrasound image was saved for documentation purposes. The paracentesis was performed. The catheter was removed and a dressing was applied. The patient tolerated the procedure well without immediate post procedural complication. FINDINGS: A total of approximately 2.4 L of yellow fluid was removed. IMPRESSION: Successful ultrasound-guided paracentesis yielding 2.4 liters of peritoneal fluid. Read by: Saverio Danker, PA-C Electronically Signed   By: Aletta Edouard M.D.   On: 12/09/2016 15:02   Dg Abd Portable  1v  Result Date: 12/08/2016 CLINICAL DATA:  Abdominal pain.  History of esophageal carcinoma EXAM: PORTABLE ABDOMEN - 1 VIEW COMPARISON:  CT abdomen pelvis November 22, 2016 FINDINGS: There is no bowel dilatation or air-fluid levels suggesting bowel obstruction. No free air. There are coils in the pelvic region on the left. A somewhat hazy appearance of the abdomen raises question of ascites. IMPRESSION: No bowel obstruction or free air evident.  Question ascites. Electronically Signed   By: Lowella Grip III M.D.   On: 12/08/2016 13:26   US Abdomen Limited Ruq  Result Date: 11/20/2016 CLINICAL DATA:  Esophageal cancer, liver metastases, abdominal distension, worsening LFTs, asthma, hypertension, diabetes mellitus, ethanol abuse EXAM: US ABDOMEN LIMITED - RIGHT UPPER QUADRANT COMPARISON:  CT abdomen 09/18/2016 FINDINGS: Gallbladder: Contracted with thickened wall. No definite shadowing calculi or sonographic Murphy sign. Common bile duct: Diameter: 4 mm diameter, normal Liver: Heterogeneous appearance with innumerable nodular foci compatible with widespread metastatic disease. No blood flow identified within portal vein on color Doppler imaging compatible with portal vein thrombosis. Small amount of scattered ascites throughout abdomen. IMPRESSION: Widespread hepatic metastatic disease. Portal vein thrombosis. Minimal ascites. Findings called to Kirby Crigler PA on 11/20/2016 at 1033 hours. Electronically Signed   By: Lavonia Dana M.D.   On: 11/20/2016 10:35    Micro Results     Recent Results (from the past 240 hour(s))  MRSA PCR Screening     Status: None   Collection Time: 12/07/16  6:59 PM  Result Value Ref Range Status   MRSA by PCR NEGATIVE NEGATIVE Final    Comment:        The GeneXpert MRSA Assay (FDA approved for NASAL specimens only), is one component of a comprehensive MRSA colonization surveillance program. It is not intended to diagnose MRSA infection nor to guide or monitor  treatment for MRSA infections.     Today   Subjective    Joshua Greene today has no headache,no chest abdominal pain,no new weakness tingling or numbness, feels much better wants to go home today.    Objective   Blood pressure 108/64, pulse 98, temperature 97.1 F (36.2 C), temperature source Oral, resp. rate 18, height '6\' 2"'$  (1.88 m), weight 81.7 kg (180 lb 1.9 oz), SpO2 100 %.   Intake/Output Summary (Last 24 hours) at 12/11/16 1123 Last data filed at 12/11/16 1308  Gross per 24 hour  Intake           855.83 ml  Output                1 ml  Net           854.83 ml    Exam Awake Alert, Oriented x 3, No new F.N deficits, Normal affect [place].AT,PERRAL Supple Neck,No JVD, No cervical lymphadenopathy appriciated.  Symmetrical Chest wall movement, Good air movement bilaterally, CTAB RRR,No Gallops,Rubs or new Murmurs, No Parasternal Heave +ve B.Sounds, Abd Soft with ascites,  Non tender, No organomegaly appriciated, No rebound -guarding or rigidity. No Cyanosis, Clubbing , trace edema, No new Rash or bruise   Data Review   CBC w Diff: Lab Results  Component Value Date   WBC 12.4 (H) 12/10/2016   HGB 10.2 (L) 12/10/2016   HGB 15.1 12/03/2014   HCT 30.9 (L) 12/10/2016   HCT 45.7 12/03/2014   PLT 118 (L) 12/10/2016   PLT 262 12/03/2014   LYMPHOPCT 13 12/08/2016   LYMPHOPCT 14.3 01/14/2014   MONOPCT 14 12/08/2016   MONOPCT 14.2 01/14/2014   EOSPCT 1 12/08/2016   EOSPCT 0.4 01/14/2014   BASOPCT 0 12/08/2016   BASOPCT 0.5 01/14/2014    CMP: Lab Results  Component Value Date   NA 133 (L) 12/09/2016   NA 137 12/03/2014   K 3.5 12/09/2016   K 3.8 12/03/2014   CL 101 12/09/2016   CL 97 (L) 12/03/2014   CO2 25 12/09/2016   CO2 30 12/03/2014   BUN 6 12/09/2016   BUN 8 12/03/2014   CREATININE 0.53 (L) 12/09/2016   CREATININE 0.69 12/03/2014   PROT 7.2 12/09/2016   PROT 8.7 (H) 12/03/2014   ALBUMIN 2.4 (L) 12/09/2016   ALBUMIN 4.3 12/03/2014   BILITOT 8.2 (H)  12/09/2016   BILITOT 0.9 12/03/2014   ALKPHOS 174 (H) 12/09/2016   ALKPHOS 71 12/03/2014   AST 176 (H) 12/09/2016   AST 51 (H) 12/03/2014   ALT 45 12/09/2016   ALT 42 12/03/2014  .   Total Time in preparing paper work, data evaluation and todays exam - 35 minutes  Thurnell Lose M.D on 12/11/2016 at 11:23 AM  [facility]   Office  [phone]

## 2016-12-11 NOTE — Care Management (Addendum)
Patient Information SS# [id number] Medicaid Pending  Patient Name Joshua Greene, Joshua Greene ([id number]) Sex Male DOB [birth year]  Room Bed  A304 A304-01  Patient Demographics   Address [place] [address] Phone [phone] (Home) [phone] (Mobile) E-mail Address [email or web address]  Patient Ethnicity & Race   Ethnic Group Patient Race  Not Hispanic or Latino Black or African American  Emergency Contact(s)   Name Relation Home Work Mobile  Jones,Antionette Significant other [phone]  [phone]  Valentino Hue Mother [phone]    Documents on File    Status Date Received Description  Documents for the Patient  [place] Not Received    [facility] E-Signature HIPAA Notice of Privacy Received 22/02/54   Driver's License Not Received    Insurance Card Received 08/02/15 medcost  Advance Directives/Living Will/HCPOA/POA Not Received    Other Photo ID Not Received  exp [id number]  Insurance Card Received 03/25/16 UMR 5/10  HIM ROI Authorization  03/29/16   AMB Provider Completed Forms  06/01/16 PRIOR AUTHORIZATION [facility]  AMB Correspondence  06/21/16 OFFICE NOTE/HALL,M.D.,ZACK  AMB Correspondence  05/09/16 OFFICE NOTE HALL MD,Z  HIM ROI Authorization  09/13/16 [place] DDS  AMB Provider Completed Forms  10/20/16 REFERRAL [place]  Documents for the Encounter  AOB (Assignment of Insurance Benefits) Not Received    E-signature AOB Signed 12/07/16   MEDICARE RIGHTS Not Received    E-signature Medicare Rights     ED Patient Billing Extract   ED PB Summary  ED Patient Billing Extract   ED Encounter Summary  Cardiac Monitoring Strip Shift Summary  12/07/16   Cardiac Monitoring Strip  12/09/16   After Visit Summary   IP After Visit Summary  Consent Form   ULTRASOUND  Admission Information   Attending Provider Admitting Provider Admission Type Admission Date/Time  Thurnell Lose, MD Thurnell Lose, MD Emergency 12/07/16 [phone]  Discharge Date Hospital Service Auth/Cert Status Service Area   Internal Medicine Incomplete [place]  Unit Room/Bed Admission Status   AP-DEPT 300 A304/A304-01 Admission (Confirmed)   Admission   Complaint  CANCER PT--RECTAL [facility] Account   Name Acct ID Class Status Primary Coverage  [place], Duris [id number] Inpatient Open None      Guarantor Account (for Hospital Account [id number])   Name Relation to Pt Service Area Active? Acct Type  Karen Kays [facility] Yes Personal/Family  Address Phone    [place], [place] [address] [phone])        Coverage Information (for Hospital Account [id number])   Not on file   LAST 4 DIGITS OF SS # [id number]

## 2016-12-12 LAB — URINE CULTURE: Culture: NO GROWTH

## 2016-12-13 ENCOUNTER — Ambulatory Visit (HOSPITAL_COMMUNITY): Payer: Self-pay

## 2016-12-13 ENCOUNTER — Telehealth (HOSPITAL_COMMUNITY): Payer: Self-pay | Admitting: Emergency Medicine

## 2016-12-13 ENCOUNTER — Ambulatory Visit (HOSPITAL_COMMUNITY): Payer: Self-pay | Admitting: Oncology

## 2016-12-13 NOTE — Telephone Encounter (Signed)
Tried to call pt and significant other (whose mailbox was full) to let them know that we have cancelled all his appts with [place] since he is on hospice.

## 2016-12-14 ENCOUNTER — Other Ambulatory Visit (HOSPITAL_COMMUNITY): Payer: Self-pay | Admitting: Pharmacist

## 2016-12-15 ENCOUNTER — Ambulatory Visit (HOSPITAL_COMMUNITY): Payer: Self-pay

## 2016-12-15 ENCOUNTER — Ambulatory Visit (HOSPITAL_COMMUNITY): Payer: Self-pay | Admitting: Oncology

## 2016-12-20 ENCOUNTER — Ambulatory Visit (HOSPITAL_COMMUNITY): Payer: Self-pay

## 2016-12-20 ENCOUNTER — Ambulatory Visit (HOSPITAL_COMMUNITY): Payer: Self-pay | Admitting: Hematology & Oncology

## 2016-12-21 ENCOUNTER — Encounter (HOSPITAL_COMMUNITY): Payer: Self-pay | Admitting: Oncology

## 2016-12-25 ENCOUNTER — Other Ambulatory Visit: Payer: Self-pay

## 2016-12-25 ENCOUNTER — Emergency Department: Payer: Medicaid Other

## 2016-12-25 ENCOUNTER — Emergency Department
Admission: EM | Admit: 2016-12-25 | Discharge: 2017-01-25 | Disposition: E | Payer: Medicaid Other | Attending: Emergency Medicine | Admitting: Emergency Medicine

## 2016-12-25 DIAGNOSIS — Z8501 Personal history of malignant neoplasm of esophagus: Secondary | ICD-10-CM | POA: Diagnosis not present

## 2016-12-25 DIAGNOSIS — J45909 Unspecified asthma, uncomplicated: Secondary | ICD-10-CM | POA: Diagnosis not present

## 2016-12-25 DIAGNOSIS — I469 Cardiac arrest, cause unspecified: Secondary | ICD-10-CM | POA: Insufficient documentation

## 2016-12-25 DIAGNOSIS — F17220 Nicotine dependence, chewing tobacco, uncomplicated: Secondary | ICD-10-CM | POA: Insufficient documentation

## 2016-12-25 DIAGNOSIS — Z5181 Encounter for therapeutic drug level monitoring: Secondary | ICD-10-CM | POA: Diagnosis not present

## 2016-12-25 DIAGNOSIS — E119 Type 2 diabetes mellitus without complications: Secondary | ICD-10-CM | POA: Insufficient documentation

## 2016-12-25 DIAGNOSIS — I10 Essential (primary) hypertension: Secondary | ICD-10-CM | POA: Insufficient documentation

## 2016-12-25 LAB — URINALYSIS, COMPLETE (UACMP) WITH MICROSCOPIC
GLUCOSE, UA: 50 mg/dL — AB
KETONES UR: NEGATIVE mg/dL
Leukocytes, UA: NEGATIVE
NITRITE: NEGATIVE
PH: 5 (ref 5.0–8.0)
PROTEIN: 30 mg/dL — AB
Specific Gravity, Urine: 1.014 (ref 1.005–1.030)
Squamous Epithelial / HPF: NONE SEEN

## 2016-12-25 LAB — CBC
HCT: 31 % — ABNORMAL LOW (ref 40.0–52.0)
HEMOGLOBIN: 10.2 g/dL — AB (ref 13.0–18.0)
MCH: 33 pg (ref 26.0–34.0)
MCHC: 32.9 g/dL (ref 32.0–36.0)
MCV: 100.5 fL — ABNORMAL HIGH (ref 80.0–100.0)
Platelets: 121 10*3/uL — ABNORMAL LOW (ref 150–440)
RBC: 3.08 MIL/uL — AB (ref 4.40–5.90)
RDW: 19.4 % — ABNORMAL HIGH (ref 11.5–14.5)
WBC: 12.2 10*3/uL — ABNORMAL HIGH (ref 3.8–10.6)

## 2016-12-25 LAB — COMPREHENSIVE METABOLIC PANEL
ALBUMIN: 1.4 g/dL — AB (ref 3.5–5.0)
ALK PHOS: 131 U/L — AB (ref 38–126)
ALT: 99 U/L — ABNORMAL HIGH (ref 17–63)
ANION GAP: 19 — AB (ref 5–15)
AST: 629 U/L — ABNORMAL HIGH (ref 15–41)
BUN: 30 mg/dL — ABNORMAL HIGH (ref 6–20)
CALCIUM: 7.5 mg/dL — AB (ref 8.9–10.3)
CHLORIDE: 92 mmol/L — AB (ref 101–111)
CO2: 14 mmol/L — AB (ref 22–32)
Creatinine, Ser: 2.24 mg/dL — ABNORMAL HIGH (ref 0.61–1.24)
GFR calc non Af Amer: 31 mL/min — ABNORMAL LOW (ref >60)
GFR, EST AFRICAN AMERICAN: 36 mL/min — AB (ref >60)
GLUCOSE: 104 mg/dL — AB (ref 65–99)
SODIUM: 125 mmol/L — AB (ref 135–145)
Total Bilirubin: 14.4 mg/dL — ABNORMAL HIGH (ref 0.3–1.2)
Total Protein: 6.3 g/dL — ABNORMAL LOW (ref 6.5–8.1)

## 2016-12-25 LAB — BLOOD GAS, ARTERIAL
Acid-base deficit: 18.6 mmol/L — ABNORMAL HIGH (ref 0.0–2.0)
Bicarbonate: 11.4 mmol/L — ABNORMAL LOW (ref 20.0–28.0)
FIO2: 1
LHR: 20 {breaths}/min
MECHVT: 500 mL
O2 SAT: 98.4 %
PATIENT TEMPERATURE: 37
PCO2 ART: 45 mmHg (ref 32.0–48.0)
PEEP: 5 cmH2O
PO2 ART: 158 mmHg — AB (ref 83.0–108.0)
pH, Arterial: 7.01 — CL (ref 7.350–7.450)

## 2016-12-25 LAB — GLUCOSE, CAPILLARY
GLUCOSE-CAPILLARY: 37 mg/dL — AB (ref 65–99)
Glucose-Capillary: 23 mg/dL — CL (ref 65–99)
Glucose-Capillary: 195 mg/dL — ABNORMAL HIGH (ref 65–99)
Glucose-Capillary: 86 mg/dL (ref 65–99)
Glucose-Capillary: 91 mg/dL (ref 65–99)

## 2016-12-25 LAB — LIPID PANEL
CHOLESTEROL: 77 mg/dL (ref 0–200)
TRIGLYCERIDES: 98 mg/dL (ref <150)
VLDL: 20 mg/dL (ref 0–40)

## 2016-12-25 LAB — DIFFERENTIAL
BAND NEUTROPHILS: 4 %
BASOS ABS: 0.2 10*3/uL — AB (ref 0–0.1)
BLASTS: 0 %
Basophils Relative: 2 %
EOS ABS: 0.1 10*3/uL (ref 0–0.7)
Eosinophils Relative: 1 %
LYMPHS PCT: 25 %
Lymphs Abs: 3.1 10*3/uL (ref 1.0–3.6)
MONOS PCT: 7 %
Metamyelocytes Relative: 2 %
Monocytes Absolute: 0.9 10*3/uL (ref 0.2–1.0)
Myelocytes: 0 %
NEUTROS ABS: 7.9 10*3/uL — AB (ref 1.4–6.5)
NEUTROS PCT: 59 %
Other: 0 %
PROMYELOCYTES ABS: 0 %
nRBC: 0 /100 WBC

## 2016-12-25 LAB — PROTIME-INR
INR: 5.06 — AB
PROTHROMBIN TIME: 48.3 s — AB (ref 11.4–15.2)

## 2016-12-25 LAB — LACTIC ACID, PLASMA: Lactic Acid, Venous: 14.4 mmol/L (ref 0.5–1.9)

## 2016-12-25 LAB — APTT: APTT: 149 s — AB (ref 24–36)

## 2016-12-25 LAB — MAGNESIUM: Magnesium: 2.8 mg/dL — ABNORMAL HIGH (ref 1.7–2.4)

## 2016-12-25 LAB — BRAIN NATRIURETIC PEPTIDE: B NATRIURETIC PEPTIDE 5: 212 pg/mL — AB (ref 0.0–100.0)

## 2016-12-25 LAB — TROPONIN I: Troponin I: 0.07 ng/mL (ref <0.03)

## 2016-12-25 MED ORDER — MORPHINE SULFATE (PF) 10 MG/ML IV SOLN
10.0000 mg | Freq: Once | INTRAVENOUS | Status: AC
  Administered 2016-12-25: 10 mg via INTRAVENOUS

## 2016-12-25 MED ORDER — SODIUM CHLORIDE 0.9 % IV SOLN
1000.0000 mL | Freq: Once | INTRAVENOUS | Status: AC
  Administered 2016-12-25: 1000 mL via INTRAVENOUS

## 2016-12-25 MED ORDER — NOREPINEPHRINE BITARTRATE 1 MG/ML IV SOLN
0.0000 ug/min | INTRAVENOUS | Status: DC
  Administered 2016-12-25: 5 ug/min via INTRAVENOUS
  Filled 2016-12-25: qty 4

## 2016-12-25 MED ORDER — DEXTROSE 50 % IV SOLN
INTRAVENOUS | Status: AC
  Administered 2016-12-25: 25 g via INTRAVENOUS
  Filled 2016-12-25: qty 50

## 2016-12-25 MED ORDER — SODIUM CHLORIDE 0.9 % IV SOLN: 1000.0000 mL | INTRAVENOUS | Status: DC

## 2016-12-25 MED ORDER — DEXTROSE 50 % IV SOLN
INTRAVENOUS | Status: DC | PRN
  Administered 2016-12-25: 50 mL via INTRAVENOUS
  Administered 2016-12-25 (×2): 1 via INTRAVENOUS

## 2016-12-25 MED ORDER — SODIUM CHLORIDE 0.9 % IV SOLN
1000.0000 mL | Freq: Once | INTRAVENOUS | Status: AC
  Administered 2016-12-25: 500 mL via INTRAVENOUS

## 2016-12-25 MED ORDER — SODIUM CHLORIDE 0.9 % IV SOLN
10.0000 mL/h | INTRAVENOUS | Status: DC
  Administered 2016-12-25: 999 mL/h via INTRAVENOUS

## 2016-12-25 MED ORDER — SODIUM CHLORIDE 0.9 % IV SOLN
INTRAVENOUS | Status: DC | PRN
  Administered 2016-12-25: 1000 mL via INTRAVENOUS

## 2016-12-25 MED ORDER — EPINEPHRINE PF 1 MG/10ML IJ SOSY
PREFILLED_SYRINGE | INTRAMUSCULAR | Status: DC | PRN
  Administered 2016-12-25 (×3): 1 via INTRAVENOUS

## 2016-12-25 MED ORDER — SODIUM BICARBONATE 8.4 % IV SOLN: 100.0000 meq | Freq: Once | INTRAVENOUS | Status: DC

## 2016-12-25 MED ORDER — SODIUM BICARBONATE 8.4 % IV SOLN
INTRAVENOUS | Status: DC | PRN
  Administered 2016-12-25 (×2): 50 meq via INTRAVENOUS

## 2016-12-25 MED ORDER — MORPHINE SULFATE (PF) 10 MG/ML IV SOLN
INTRAVENOUS | Status: AC
  Administered 2016-12-25: 10 mg via INTRAVENOUS
  Filled 2016-12-25: qty 1

## 2016-12-25 MED ORDER — MORPHINE SULFATE (PF) 10 MG/ML IV SOLN
10.0000 mg | Freq: Once | INTRAVENOUS | Status: DC
  Filled 2016-12-25: qty 1

## 2016-12-25 MED ORDER — SODIUM CHLORIDE 0.9 % IV SOLN: 1.0000 g | Freq: Once | INTRAVENOUS | Status: DC

## 2016-12-25 MED ORDER — CALCIUM GLUCONATE 10 % IV SOLN: 1.0000 g | Freq: Once | INTRAVENOUS | Status: DC

## 2016-12-25 NOTE — Progress Notes (Signed)
Pt was a witnessed cardiac arrest 01/1 at home.  Per ER notes pt was found unresponsive by family and contacted EMS, family initiated CPR prior to EMS arrival. Per ER notes pt was recently admitted to the hospital and code status was DO NOT RESUSCITATE prior to discharge from the hospital. Upon EMS arrival family stated they wanted everything done despite DNR status, pt was pulseless therefore CPR was continued.  He was initially asystole and then found to have a shockable rhythm.  Following defibrillation pt went into sinus bradycardia requiring continuous pacing total downtime was 15 minutes prior to ROSC.  Upon arrival to the ER he lost his pulse again and ACLS protocol was initiated with ROSC and pt was subsequently intubated.  PCCM contacted to admit pt to ICU. 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 Due to significant decline in health and per oncologist progress note on 12/20 discussing the incurable nature of his cancer and that he is not a candidate for ongoing treatment I met with pts family 01/1 in the ER to discuss goals of treatment.  According to pts mother and father the pt has been suffering significantly and has had ongoing pain.  The pts father and mother stated they do not want their son to suffer anymore and would like the pt to be comfort care only.  The pts mother and father stated they wanted to proceed with terminal extubation.  They also stated they wanted their pastor to pray for the pt once he was terminally extubated. I Informed the entire family the goal of treatment going forward would be to ensure the pts comfort per pts mother and father request.  Spoke with ER physician Dr. Burlene Arnt about change in plan of care and he will terminally extubate pt in ER and place orders for comfort measures only.  Marda Stalker, [place] Pager [phone] (please enter 7 digits) PCCM Consult Pager [phone] (please enter 7 digits)

## 2016-12-25 NOTE — ED Notes (Signed)
CPR 2017-2014 CPR resumed [id number]. CPR resumed 2018-2020

## 2016-12-25 NOTE — ED Provider Notes (Addendum)
[facility] Emergency Department Provider Note  ____________________________________________   I have reviewed the triage vital signs and the nursing notes.   HISTORY  Chief Complaint Cardiac Arrest    HPI Joshua Greene is a 56 y.o. male who presents today after cardiac arrest. The patient had a witnessed cardiac arrest at home. Her last note from him suggest that he was a DO NOT RESUSCITATE but the family wanted everything done. EMS and appropriately worked him. He was pulseless upon arrival. At least 15 minutes of downtime. He was asystole then they found a shockable rhythm and they got him back into sinus but was bradycardia. They were able to pace him. He lost pulses however medially upon arrival to the emergency department. CPR was initiated. Patient has end-stage cancer   Level 5 chart caveat; no further history available due to patient status.   Past Medical History:  Diagnosis Date  . Alcohol abuse    been through rehab  . Allergy   . Anemia   . Asthma    childhood  . Chicken pox   . Chronic bronchitis ([place])   . Diabetes mellitus without complication ([place])   . Erectile dysfunction   . Esophageal cancer ([place]) 05/22/2016  . History of stomach ulcers   . Hypertension   . Portal vein thrombosis 11/23/2016    Patient Active Problem List   Diagnosis Date Noted  . 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 Supratherapeutic INR   . Gastrointestinal hemorrhage 12/07/2016  . Alcoholic cirrhosis of liver with ascites ([place])   . Oral bleeding   . Decompensated liver disease ([place])   . Tachycardia   . Palliative care encounter   . Goals of care, counseling/discussion   . Portal vein thrombosis 11/23/2016  . Thrush of mouth and esophagus ([place]) 08/11/2016  . Bacterial intestinal infection 07/13/2016  . Liver metastases ([place]) 05/29/2016  . Malignant neoplasm of lower third of esophagus ([place]) 05/22/2016  . Alcohol abuse 08/02/2015    Past Surgical  History:  Procedure Laterality Date  . APPENDECTOMY  1978  . ESOPHAGOGASTRODUODENOSCOPY N/A 05/25/2016   Procedure: ESOPHAGOGASTRODUODENOSCOPY (EGD);  Surgeon: Daneil Dolin, MD;  Location: [facility] ENDO SUITE;  Service: Endoscopy;  Laterality: N/A;  . HERNIA REPAIR      Prior to Admission medications   Medication Sig Start Date End Date Taking? Authorizing Provider  CARBOPLATIN IV Inject into the vein. Day 1, day 8, day 15 every 28 days    Historical Provider, MD  furosemide (LASIX) 20 MG tablet Take 1 tablet (20 mg total) by mouth daily. 07/13/16   Patrici Ranks, MD  lidocaine-prilocaine (EMLA) cream Apply 1 application topically as needed. Apply quarter size amount to affected area 1 hour prior to chemotherapy 11/29/16   Patrici Ranks, MD  LORazepam (ATIVAN) 2 MG/ML concentrated solution Take 0.5 mLs (1 mg total) by mouth every 6 (six) hours as needed for anxiety. 12/11/16   Thurnell Lose, MD  megestrol (MEGACE) 400 MG/10ML suspension Take 10 mLs (400 mg total) by mouth 2 (two) times daily. 11/23/16   Baird Cancer, PA-C  morphine (MSIR) 15 MG tablet Take 1 tablet (15 mg total) by mouth every 6 (six) hours as needed for severe pain. 12/11/16   Thurnell Lose, MD  ondansetron (ZOFRAN-ODT) 8 MG disintegrating tablet Take 8 mg by mouth every 8 (eight) hours as needed for nausea or vomiting.    Historical Provider, MD  PACLitaxel (TAXOL IV) Inject into the vein.  Day 1, day 8, day 15, every 28 days    Historical Provider, MD  pantoprazole (PROTONIX) 40 MG tablet Take 1 tablet (40 mg total) by mouth 2 (two) times daily before a meal. 12/11/16   Thurnell Lose, MD  potassium chloride SA (K-DUR,KLOR-CON) 20 MEQ tablet Take 3 tablets (60 mEq total) by mouth daily. 11/13/16   Baird Cancer, PA-C    Allergies Patient has no known allergies.  Family History  Problem Relation Age of Onset  . Hypertension Mother   . Diabetes Mother   . Cancer Neg Hx   . Heart disease Neg Hx   . Stroke  Neg Hx     Social History Social History  Substance Use Topics  . Smoking status: Never Smoker  . Smokeless tobacco: Current User    Types: Chew     Comment: 5 years  . Alcohol use Yes     Comment: occ    Review of Systems Level 5 chart caveat; no further history available due to patient status..  ____________________________________________   PHYSICAL EXAM:  VITAL SIGNS: ED Triage Vitals  Enc Vitals Group     BP 01/16/2017 2025 114/76     Pulse Rate 01/22/2017 2015 (!) 129     Resp 01/10/2017 2015 18     Temp 01/07/2017 2045 (!) 92.7 F (33.7 C)     Temp src --      SpO2 01/14/2017 2015 92 %     Weight 01/23/2017 2035 180 lb (81.6 kg)     Height 12/29/2016 2035 '6\' 2"'$  (1.88 m)     Head Circumference --      Peak Flow --      Pain Score --      Pain Loc --      Pain Edu? --      Excl. in [place]? --     Constitutional: Nonresponsive Eyes:  Nonresponsive Head: Atraumatic. Nose: No congestion/rhinnorhea. Mouth/Throat: The oropharynx noted to be bloody King airway in place Cardiovascular: Pulseless Respiratory: Rhonchorous and bagged Abdominal: Distended Neurologic:  Nonresponsive Skin:  Skin is warm, dry and intact. No rash noted. Psychiatric:   ____________________________________________   LABS (all labs ordered are listed, but only abnormal results are displayed)  Labs Reviewed  CBC - Abnormal; Notable for the following:       Result Value   WBC 12.2 (*)    RBC 3.08 (*)    Hemoglobin 10.2 (*)    HCT 31.0 (*)    MCV 100.5 (*)    RDW 19.4 (*)    Platelets 121 (*)    All other components within normal limits  URINALYSIS, COMPLETE (UACMP) WITH MICROSCOPIC - Abnormal; Notable for the following:    Color, Urine AMBER (*)    APPearance HAZY (*)    Glucose, UA 50 (*)    Hgb urine dipstick MODERATE (*)    Bilirubin Urine MODERATE (*)    Protein, ur 30 (*)    Bacteria, UA RARE (*)    All other components within normal limits  GLUCOSE, CAPILLARY - Abnormal; Notable  for the following:    Glucose-Capillary 37 (*)    All other components within normal limits  GLUCOSE, CAPILLARY - Abnormal; Notable for the following:    Glucose-Capillary 23 (*)    All other components within normal limits  GLUCOSE, CAPILLARY - Abnormal; Notable for the following:    Glucose-Capillary 195 (*)    All other components within normal limits  GLUCOSE, CAPILLARY  GLUCOSE, CAPILLARY  DIFFERENTIAL  PROTIME-INR  APTT  COMPREHENSIVE METABOLIC PANEL  TROPONIN I  LIPID PANEL  MAGNESIUM  LACTIC ACID, PLASMA  LACTIC ACID, PLASMA  BRAIN NATRIURETIC PEPTIDE  CBG MONITORING, ED   ____________________________________________  EKG  I personally interpreted any EKGs ordered by me or triage First EKG shows a wide-complex junctional rhythm. Second EKG shows a more narrow junctional rhythm. ____________________________________________  RADIOLOGY  I reviewed any imaging ordered by me or triage that were performed during my shift and, if possible, patient and/or family made aware of any abnormal findings. ____________________________________________   PROCEDURES  Procedure(s) performed: INTUBATION Performed by: Schuyler Amor  Required items: required blood products, implants, devices, and special equipment available Patient identity confirmed: provided demographic data and hospital-assigned identification number Time out: Immediately prior to procedure a "time out" was called to verify the correct patient, procedure, equipment, support staff and site/side marked as required.  Indications: Cardiac arrest   Intubation method: Positive Glidescope Laryngoscopy   Preoxygenation: BVM   Tube Size: 7.5 cuffed  Post-procedure assessment: chest rise and ETCO2 monitor Breath sounds: equal and absent over the epigastrium Tube secured with: ETT holder Chest x-ray interpreted by radiologist and me.  Chest x-ray findings:  + endotracheal tube in appropriate position  Patient  tolerated the procedure well with no immediate complications.     Procedures  Critical Care performed: CRITICAL CARE Performed by: Schuyler Amor   Total critical care time: 120 minutes  Critical care time was exclusive of separately billable procedures and treating other patients.  Critical care was necessary to treat or prevent imminent or life-threatening deterioration.  Critical care was time spent personally by me on the following activities: development of treatment plan with patient and/or surrogate as well as nursing, discussions with consultants, evaluation of patient's response to treatment, examination of patient, obtaining history from patient or surrogate, ordering and performing treatments and interventions, ordering and review of laboratory studies, ordering and review of radiographic studies, pulse oximetry and re-evaluation of patient's condition.   ____________________________________________   INITIAL IMPRESSION / ASSESSMENT AND PLAN / ED COURSE  Pertinent labs & imaging results that were available during my care of the patient were reviewed by me and considered in my medical decision making (see chart for details).  Patient with a witnessed cardiac arrest 2. His blood sugar was very low. EMS did give him sugar, I rechecked it and it remained low. He's had a total of 4 A of D50 and now his sugar is in the 190s. He was in cardiac arrest. Full ACLS protocol was followed. He was intubated by me. It was a difficult intubation given a lot of blood in the oropharynx and blood in the ET tube. No obvious trauma noted. Patient had return responses circulation press 15 minutes after arrival. We have given him IV fluid, he is on pressor support and ventilatory support. He is not at this time breathing over the vent. I have expanded to the family that his prognosis, which was dire on the cancer alone, now is catastrophic. At this time, they wish her to be DO NOT RESUSCITATE. They  do not wish anymore CPR because again. ICU physician aware and they will come admit the patient. They agree with management.  ----------------------------------------- 9:39 PM on 01/22/2017 -----------------------------------------  Mother, who is decision maker, as well as father have elected to make the patient comfort measures only. They wish to withdraw care at this time. This is certainly a reasonable choice.  They did tell this to the ICU as well as to me. They understand this is a terminal decision the patient will die. Accordingly, we will stop treating him. His potassium was very high we will not give the  bicarbonate that has been ordered, I personally believe this is a reasonable decision on the behalf of the family. His lactic acid is 14, and I do not believe this is a recoverable event to the patient who is at baseline healthy and patient certainly is not. We will give comfort measures and using morphine as needed and Ativan. And when the family is ready we will withdraw the ET tube.  ----------------------------------------- 10:46 PM on 01/04/2017 -----------------------------------------  We gave morphine to eliminate possible air hunger, we did remove the ET tube at the family's request. At 2245 I was able to declare the patient dead. There was no cardiac activity on the monitor, nose no respirations, I do not auscultate a heartbeat or lung sounds, he has no pupillary reflexes, at bedside and he died peacefully.  ----------------------------------------- 11:14 PM on 12/28/2016 -----------------------------------------  Dr. Blanchie Serve was contacted, she agrees with management, she will sign the death certificate.  Clinical Course    ____________________________________________   FINAL CLINICAL IMPRESSION(S) / ED DIAGNOSES  Final diagnoses:  None      This chart was dictated using voice recognition software.  Despite best efforts to proofread,  errors can occur which  can change meaning.      Schuyler Amor, MD 01/02/2017 2130    Schuyler Amor, MD 01/15/2017 2141    Schuyler Amor, MD 01/20/2017 [place], MD 12/31/2016 [place], MD 12/27/2016 [phone]

## 2016-12-25 NOTE — Progress Notes (Signed)
CH responded to RN request. Pt was being attended to by MD but has lost pulse numerous times. Father, Mother, and numerous family members were in the waiting room. CH accompanied MD to family for consultation. [place] escorted family to a larger waiting room and provided empathetic listening and hospitality. CH yielded spiritual care to family minister. CH is available for follow up as needed.    01/15/2017 2100  Clinical Encounter Type  Visited With Patient;Family;Health care provider  Visit Type Initial;Spiritual support;ED;Patient actively dying  Referral From Nurse  Spiritual Encounters  Spiritual Needs Prayer;Emotional;Grief support

## 2016-12-25 NOTE — ED Notes (Signed)
Family at bedside. Dr. Burlene Arnt informed RN that family had spoken with CC NP and had decided to withdraw care, placing pt on comfort care at this time. At present, pt remains on monitors and is receiving NS PIV BOLUS X 2 W/ LEVOPHED AT 10 MCG/MIN.

## 2016-12-25 NOTE — ED Triage Notes (Signed)
PT FOUND FOUND UNRESPONSIVE by family, total down time approximately 15 mins prior to EMS arrival, estimated 7 minutes of intervention before having shockable rhythm. Pt is unresponsive and paced per EMS. Pt reported to have asystole upon EMS arrival. Pt's family started CPR, continued by FIRE x 2 rounds of CPR.

## 2016-12-26 ENCOUNTER — Other Ambulatory Visit: Payer: Self-pay | Admitting: Nurse Practitioner

## 2016-12-26 MED FILL — Medication: Qty: 1 | Status: AC

## 2016-12-29 ENCOUNTER — Ambulatory Visit (HOSPITAL_COMMUNITY): Payer: Self-pay | Admitting: Hematology & Oncology

## 2016-12-29 ENCOUNTER — Ambulatory Visit (HOSPITAL_COMMUNITY): Payer: Self-pay

## 2017-01-03 ENCOUNTER — Ambulatory Visit (HOSPITAL_COMMUNITY): Payer: Self-pay | Admitting: Oncology

## 2017-01-03 ENCOUNTER — Ambulatory Visit (HOSPITAL_COMMUNITY): Payer: Self-pay

## 2017-01-25 DIAGNOSIS — 419620001 Death: Secondary | SNOMED CT | POA: Diagnosis not present

## 2017-01-25 DEATH — deceased

## 2017-02-22 ENCOUNTER — Ambulatory Visit: Payer: Self-pay | Admitting: Family Medicine
